# Patient Record
Sex: Female | Born: 1935 | Race: White | Hispanic: No | Smoking: Never smoker
Health system: Southern US, Community
[De-identification: ages and names within clinical notes are randomized; demographics above are authoritative.]

## PROBLEM LIST (undated history)

## (undated) DIAGNOSIS — Z86711 Personal history of pulmonary embolism: Secondary | ICD-10-CM

## (undated) DIAGNOSIS — E785 Hyperlipidemia, unspecified: Secondary | ICD-10-CM

## (undated) DIAGNOSIS — Z978 Presence of other specified devices: Secondary | ICD-10-CM

## (undated) DIAGNOSIS — K529 Noninfective gastroenteritis and colitis, unspecified: Secondary | ICD-10-CM

## (undated) DIAGNOSIS — Z7901 Long term (current) use of anticoagulants: Secondary | ICD-10-CM

## (undated) DIAGNOSIS — R234 Changes in skin texture: Secondary | ICD-10-CM

## (undated) DIAGNOSIS — K219 Gastro-esophageal reflux disease without esophagitis: Secondary | ICD-10-CM

## (undated) DIAGNOSIS — IMO0001 Reserved for inherently not codable concepts without codable children: Secondary | ICD-10-CM

## (undated) DIAGNOSIS — Z960 Presence of urogenital implants: Secondary | ICD-10-CM

## (undated) DIAGNOSIS — I10 Essential (primary) hypertension: Secondary | ICD-10-CM

## (undated) DIAGNOSIS — R531 Weakness: Secondary | ICD-10-CM

## (undated) DIAGNOSIS — R42 Dizziness and giddiness: Secondary | ICD-10-CM

## (undated) DIAGNOSIS — I2510 Atherosclerotic heart disease of native coronary artery without angina pectoris: Secondary | ICD-10-CM

## (undated) DIAGNOSIS — R600 Localized edema: Secondary | ICD-10-CM

## (undated) DIAGNOSIS — R339 Retention of urine, unspecified: Secondary | ICD-10-CM

## (undated) DIAGNOSIS — H9190 Unspecified hearing loss, unspecified ear: Secondary | ICD-10-CM

## (undated) DIAGNOSIS — Z8679 Personal history of other diseases of the circulatory system: Secondary | ICD-10-CM

## (undated) DIAGNOSIS — M1990 Unspecified osteoarthritis, unspecified site: Secondary | ICD-10-CM

## (undated) DIAGNOSIS — E039 Hypothyroidism, unspecified: Secondary | ICD-10-CM

## (undated) DIAGNOSIS — Z86718 Personal history of other venous thrombosis and embolism: Secondary | ICD-10-CM

## (undated) DIAGNOSIS — F0390 Unspecified dementia without behavioral disturbance: Secondary | ICD-10-CM

## (undated) DIAGNOSIS — Z9289 Personal history of other medical treatment: Secondary | ICD-10-CM

## (undated) HISTORY — PX: CARDIAC CATHETERIZATION: SHX172

## (undated) HISTORY — DX: Gastro-esophageal reflux disease without esophagitis: K21.9

## (undated) HISTORY — PX: CATARACT EXTRACTION W/ INTRAOCULAR LENS  IMPLANT, BILATERAL: SHX1307

## (undated) HISTORY — PX: KNEE SURGERY: SHX244

## (undated) HISTORY — DX: Personal history of other medical treatment: Z92.89

## (undated) HISTORY — PX: TUBAL LIGATION: SHX77

## (undated) HISTORY — PX: KNEE ARTHROSCOPY: SUR90

## (undated) HISTORY — PX: BLADDER SUSPENSION: SHX72

## (undated) HISTORY — DX: Essential (primary) hypertension: I10

## (undated) HISTORY — PX: TRANSTHORACIC ECHOCARDIOGRAM: SHX275

## (undated) HISTORY — PX: ABDOMINAL HYSTERECTOMY: SHX81

---

## 1997-10-31 ENCOUNTER — Other Ambulatory Visit: Admission: RE | Admit: 1997-10-31 | Discharge: 1997-10-31 | Payer: Self-pay | Admitting: *Deleted

## 1997-11-03 ENCOUNTER — Other Ambulatory Visit: Admission: RE | Admit: 1997-11-03 | Discharge: 1997-11-03 | Payer: Self-pay | Admitting: Family Medicine

## 1997-12-28 ENCOUNTER — Ambulatory Visit (HOSPITAL_COMMUNITY): Admission: RE | Admit: 1997-12-28 | Discharge: 1997-12-28 | Payer: Self-pay | Admitting: Family Medicine

## 1998-02-06 ENCOUNTER — Ambulatory Visit (HOSPITAL_COMMUNITY): Admission: RE | Admit: 1998-02-06 | Discharge: 1998-02-06 | Payer: Self-pay | Admitting: Internal Medicine

## 1998-05-19 HISTORY — PX: CHOLECYSTECTOMY: SHX55

## 1999-06-25 ENCOUNTER — Emergency Department (HOSPITAL_COMMUNITY): Admission: EM | Admit: 1999-06-25 | Discharge: 1999-06-25 | Payer: Self-pay | Admitting: *Deleted

## 1999-09-24 ENCOUNTER — Encounter: Payer: Self-pay | Admitting: Family Medicine

## 1999-09-24 ENCOUNTER — Encounter: Admission: RE | Admit: 1999-09-24 | Discharge: 1999-09-24 | Payer: Self-pay | Admitting: Family Medicine

## 1999-09-27 ENCOUNTER — Encounter: Admission: RE | Admit: 1999-09-27 | Discharge: 1999-09-27 | Payer: Self-pay | Admitting: Family Medicine

## 1999-09-27 ENCOUNTER — Encounter: Payer: Self-pay | Admitting: Family Medicine

## 1999-12-31 ENCOUNTER — Encounter: Admission: RE | Admit: 1999-12-31 | Discharge: 1999-12-31 | Payer: Self-pay | Admitting: Family Medicine

## 1999-12-31 ENCOUNTER — Encounter: Payer: Self-pay | Admitting: Family Medicine

## 2000-04-14 ENCOUNTER — Emergency Department (HOSPITAL_COMMUNITY): Admission: EM | Admit: 2000-04-14 | Discharge: 2000-04-14 | Payer: Self-pay | Admitting: Emergency Medicine

## 2000-09-30 ENCOUNTER — Encounter: Payer: Self-pay | Admitting: Family Medicine

## 2000-09-30 ENCOUNTER — Encounter: Admission: RE | Admit: 2000-09-30 | Discharge: 2000-09-30 | Payer: Self-pay | Admitting: Family Medicine

## 2001-01-07 ENCOUNTER — Other Ambulatory Visit: Admission: RE | Admit: 2001-01-07 | Discharge: 2001-01-07 | Payer: Self-pay | Admitting: *Deleted

## 2001-02-23 ENCOUNTER — Encounter: Admission: RE | Admit: 2001-02-23 | Discharge: 2001-02-23 | Payer: Self-pay | Admitting: Family Medicine

## 2001-02-23 ENCOUNTER — Encounter: Payer: Self-pay | Admitting: Family Medicine

## 2001-12-21 ENCOUNTER — Encounter: Payer: Self-pay | Admitting: Family Medicine

## 2001-12-21 ENCOUNTER — Encounter: Admission: RE | Admit: 2001-12-21 | Discharge: 2001-12-21 | Payer: Self-pay | Admitting: Family Medicine

## 2004-09-27 ENCOUNTER — Ambulatory Visit (HOSPITAL_COMMUNITY): Admission: RE | Admit: 2004-09-27 | Discharge: 2004-09-27 | Payer: Self-pay | Admitting: Cardiology

## 2005-08-26 ENCOUNTER — Other Ambulatory Visit: Admission: RE | Admit: 2005-08-26 | Discharge: 2005-08-26 | Payer: Self-pay | Admitting: Obstetrics and Gynecology

## 2006-05-14 ENCOUNTER — Ambulatory Visit: Payer: Self-pay | Admitting: Cardiology

## 2006-05-14 ENCOUNTER — Inpatient Hospital Stay (HOSPITAL_COMMUNITY): Admission: EM | Admit: 2006-05-14 | Discharge: 2006-05-20 | Payer: Self-pay | Admitting: Emergency Medicine

## 2008-08-03 ENCOUNTER — Observation Stay (HOSPITAL_COMMUNITY): Admission: EM | Admit: 2008-08-03 | Discharge: 2008-08-04 | Payer: Self-pay | Admitting: Emergency Medicine

## 2009-01-09 ENCOUNTER — Encounter: Admission: RE | Admit: 2009-01-09 | Discharge: 2009-01-09 | Payer: Self-pay | Admitting: Orthopedic Surgery

## 2009-01-19 ENCOUNTER — Emergency Department (HOSPITAL_COMMUNITY): Admission: EM | Admit: 2009-01-19 | Discharge: 2009-01-20 | Payer: Self-pay | Admitting: Emergency Medicine

## 2009-02-23 ENCOUNTER — Encounter (INDEPENDENT_AMBULATORY_CARE_PROVIDER_SITE_OTHER): Payer: Self-pay | Admitting: Internal Medicine

## 2009-02-23 ENCOUNTER — Inpatient Hospital Stay (HOSPITAL_COMMUNITY): Admission: EM | Admit: 2009-02-23 | Discharge: 2009-02-27 | Payer: Self-pay | Admitting: Emergency Medicine

## 2009-04-02 ENCOUNTER — Encounter: Admission: RE | Admit: 2009-04-02 | Discharge: 2009-04-02 | Payer: Self-pay | Admitting: Family Medicine

## 2010-06-12 ENCOUNTER — Ambulatory Visit: Payer: Self-pay | Admitting: Cardiology

## 2010-08-22 LAB — BASIC METABOLIC PANEL
BUN: 17 mg/dL (ref 6–23)
BUN: 23 mg/dL (ref 6–23)
BUN: 30 mg/dL — ABNORMAL HIGH (ref 6–23)
CO2: 19 mEq/L (ref 19–32)
CO2: 20 mEq/L (ref 19–32)
CO2: 22 mEq/L (ref 19–32)
Calcium: 8 mg/dL — ABNORMAL LOW (ref 8.4–10.5)
Calcium: 8.5 mg/dL (ref 8.4–10.5)
Chloride: 108 mEq/L (ref 96–112)
Chloride: 112 mEq/L (ref 96–112)
Creatinine, Ser: 0.98 mg/dL (ref 0.4–1.2)
Creatinine, Ser: 0.99 mg/dL (ref 0.4–1.2)
Creatinine, Ser: 1.06 mg/dL (ref 0.4–1.2)
Creatinine, Ser: 1.4 mg/dL — ABNORMAL HIGH (ref 0.4–1.2)
GFR calc non Af Amer: 54 mL/min — ABNORMAL LOW (ref >60)
Glucose, Bld: 148 mg/dL — ABNORMAL HIGH (ref 70–99)
Glucose, Bld: 243 mg/dL — ABNORMAL HIGH (ref 70–99)
Potassium: 3.8 mEq/L (ref 3.5–5.1)

## 2010-08-22 LAB — GLUCOSE, CAPILLARY
Glucose-Capillary: 142 mg/dL — ABNORMAL HIGH (ref 70–99)
Glucose-Capillary: 159 mg/dL — ABNORMAL HIGH (ref 70–99)
Glucose-Capillary: 173 mg/dL — ABNORMAL HIGH (ref 70–99)
Glucose-Capillary: 180 mg/dL — ABNORMAL HIGH (ref 70–99)
Glucose-Capillary: 182 mg/dL — ABNORMAL HIGH (ref 70–99)
Glucose-Capillary: 236 mg/dL — ABNORMAL HIGH (ref 70–99)
Glucose-Capillary: 263 mg/dL — ABNORMAL HIGH (ref 70–99)
Glucose-Capillary: 279 mg/dL — ABNORMAL HIGH (ref 70–99)
Glucose-Capillary: 280 mg/dL — ABNORMAL HIGH (ref 70–99)
Glucose-Capillary: 285 mg/dL — ABNORMAL HIGH (ref 70–99)
Glucose-Capillary: 394 mg/dL — ABNORMAL HIGH (ref 70–99)
Glucose-Capillary: 63 mg/dL — ABNORMAL LOW (ref 70–99)

## 2010-08-22 LAB — URINE CULTURE: Culture: NO GROWTH

## 2010-08-22 LAB — CBC
HCT: 29.6 % — ABNORMAL LOW (ref 36.0–46.0)
HCT: 31.3 % — ABNORMAL LOW (ref 36.0–46.0)
HCT: 34.5 % — ABNORMAL LOW (ref 36.0–46.0)
Hemoglobin: 11 g/dL — ABNORMAL LOW (ref 12.0–15.0)
Hemoglobin: 11.9 g/dL — ABNORMAL LOW (ref 12.0–15.0)
MCHC: 34.3 g/dL (ref 30.0–36.0)
MCHC: 35.1 g/dL (ref 30.0–36.0)
MCV: 83.9 fL (ref 78.0–100.0)
MCV: 84.1 fL (ref 78.0–100.0)
MCV: 85.1 fL (ref 78.0–100.0)
MCV: 85.1 fL (ref 78.0–100.0)
Platelets: 200 10*3/uL (ref 150–400)
Platelets: 218 10*3/uL (ref 150–400)
Platelets: 223 10*3/uL (ref 150–400)
RBC: 3.73 MIL/uL — ABNORMAL LOW (ref 3.87–5.11)
RBC: 4.1 MIL/uL (ref 3.87–5.11)
RDW: 14.9 % (ref 11.5–15.5)
RDW: 15.1 % (ref 11.5–15.5)
WBC: 8.6 10*3/uL (ref 4.0–10.5)
WBC: 9 10*3/uL (ref 4.0–10.5)
WBC: 9.7 10*3/uL (ref 4.0–10.5)

## 2010-08-22 LAB — CARDIAC PANEL(CRET KIN+CKTOT+MB+TROPI)
Troponin I: 0.1 ng/mL — ABNORMAL HIGH (ref 0.00–0.06)
Troponin I: 0.15 ng/mL — ABNORMAL HIGH (ref 0.00–0.06)

## 2010-08-22 LAB — COMPREHENSIVE METABOLIC PANEL
ALT: 12 U/L (ref 0–35)
AST: 18 U/L (ref 0–37)
Calcium: 9 mg/dL (ref 8.4–10.5)
Creatinine, Ser: 1.48 mg/dL — ABNORMAL HIGH (ref 0.4–1.2)
GFR calc non Af Amer: 34 mL/min — ABNORMAL LOW (ref >60)
Potassium: 4.8 mEq/L (ref 3.5–5.1)
Total Bilirubin: 0.5 mg/dL (ref 0.3–1.2)

## 2010-08-22 LAB — MAGNESIUM: Magnesium: 1.8 mg/dL (ref 1.5–2.5)

## 2010-08-22 LAB — LIPID PANEL
LDL Cholesterol: 71 mg/dL (ref 0–99)
Total CHOL/HDL Ratio: 4.5 RATIO
VLDL: 26 mg/dL (ref 0–40)

## 2010-08-22 LAB — PROTIME-INR
INR: 1.24 (ref 0.00–1.49)
INR: 2.88 — ABNORMAL HIGH (ref 0.00–1.49)
INR: 3.01 — ABNORMAL HIGH (ref 0.00–1.49)
Prothrombin Time: 15.5 seconds — ABNORMAL HIGH (ref 11.6–15.2)
Prothrombin Time: 26.4 seconds — ABNORMAL HIGH (ref 11.6–15.2)
Prothrombin Time: 29.9 seconds — ABNORMAL HIGH (ref 11.6–15.2)

## 2010-08-22 LAB — DIFFERENTIAL
Basophils Absolute: 0 10*3/uL (ref 0.0–0.1)
Basophils Relative: 0 % (ref 0–1)
Basophils Relative: 0 % (ref 0–1)
Eosinophils Absolute: 0 10*3/uL (ref 0.0–0.7)
Eosinophils Absolute: 0.1 10*3/uL (ref 0.0–0.7)
Eosinophils Relative: 0 % (ref 0–5)
Eosinophils Relative: 0 % (ref 0–5)
Lymphocytes Relative: 16 % (ref 12–46)
Lymphs Abs: 1.5 10*3/uL (ref 0.7–4.0)
Lymphs Abs: 1.5 10*3/uL (ref 0.7–4.0)
Monocytes Absolute: 0.9 10*3/uL (ref 0.1–1.0)
Monocytes Absolute: 0.9 10*3/uL (ref 0.1–1.0)
Monocytes Relative: 10 % (ref 3–12)
Neutrophils Relative %: 73 % (ref 43–77)
Neutrophils Relative %: 75 % (ref 43–77)

## 2010-08-22 LAB — URINALYSIS, ROUTINE W REFLEX MICROSCOPIC
Bilirubin Urine: NEGATIVE
Glucose, UA: >1000 mg/dL — AB
Ketones, ur: NEGATIVE mg/dL
Protein, ur: 30 mg/dL — AB

## 2010-08-22 LAB — HEMOGLOBIN A1C
Hgb A1c MFr Bld: 11.3 % — ABNORMAL HIGH (ref 4.6–6.1)
Mean Plasma Glucose: 278 mg/dL

## 2010-08-22 LAB — POCT CARDIAC MARKERS: CKMB, poc: 2.9 ng/mL (ref 1.0–8.0)

## 2010-08-22 LAB — CULTURE, BLOOD (ROUTINE X 2): Culture: NO GROWTH

## 2010-08-22 LAB — URINE MICROSCOPIC-ADD ON

## 2010-08-22 LAB — CK TOTAL AND CKMB (NOT AT ARMC): Relative Index: INVALID (ref 0.0–2.5)

## 2010-08-23 LAB — BASIC METABOLIC PANEL
CO2: 25 mEq/L (ref 19–32)
Calcium: 8.9 mg/dL (ref 8.4–10.5)
GFR calc Af Amer: 50 mL/min — ABNORMAL LOW (ref >60)
Glucose, Bld: 288 mg/dL — ABNORMAL HIGH (ref 70–99)
Potassium: 4 mEq/L (ref 3.5–5.1)
Sodium: 136 mEq/L (ref 135–145)

## 2010-08-23 LAB — URINALYSIS, ROUTINE W REFLEX MICROSCOPIC
Glucose, UA: 500 mg/dL — AB
Ketones, ur: NEGATIVE mg/dL
Nitrite: NEGATIVE
Protein, ur: 100 mg/dL — AB
Specific Gravity, Urine: 1.025 (ref 1.005–1.030)
Urobilinogen, UA: 0.2 mg/dL (ref 0.0–1.0)
Urobilinogen, UA: 0.2 mg/dL (ref 0.0–1.0)
pH: 5 (ref 5.0–8.0)

## 2010-08-23 LAB — DIFFERENTIAL
Eosinophils Absolute: 0.3 10*3/uL (ref 0.0–0.7)
Lymphocytes Relative: 21 % (ref 12–46)
Lymphs Abs: 2.5 10*3/uL (ref 0.7–4.0)
Monocytes Relative: 9 % (ref 3–12)
Neutro Abs: 7.9 10*3/uL — ABNORMAL HIGH (ref 1.7–7.7)
Neutrophils Relative %: 66 % (ref 43–77)

## 2010-08-23 LAB — URINE MICROSCOPIC-ADD ON

## 2010-08-23 LAB — URINE CULTURE
Colony Count: NO GROWTH
Culture: NO GROWTH

## 2010-08-23 LAB — CBC
Platelets: 212 10*3/uL (ref 150–400)
RBC: 4.16 MIL/uL (ref 3.87–5.11)
WBC: 11.9 10*3/uL — ABNORMAL HIGH (ref 4.0–10.5)

## 2010-08-27 ENCOUNTER — Ambulatory Visit: Payer: Self-pay | Admitting: Cardiology

## 2010-08-29 LAB — GLUCOSE, CAPILLARY
Glucose-Capillary: 113 mg/dL — ABNORMAL HIGH (ref 70–99)
Glucose-Capillary: 154 mg/dL — ABNORMAL HIGH (ref 70–99)
Glucose-Capillary: 49 mg/dL — ABNORMAL LOW (ref 70–99)

## 2010-08-29 LAB — URINALYSIS, ROUTINE W REFLEX MICROSCOPIC
Bilirubin Urine: NEGATIVE
Glucose, UA: NEGATIVE mg/dL
Hgb urine dipstick: NEGATIVE
Ketones, ur: NEGATIVE mg/dL
Protein, ur: NEGATIVE mg/dL

## 2010-08-29 LAB — URINE CULTURE: Culture: NO GROWTH

## 2010-09-02 ENCOUNTER — Encounter: Payer: Self-pay | Admitting: *Deleted

## 2010-09-02 ENCOUNTER — Other Ambulatory Visit: Payer: Self-pay | Admitting: *Deleted

## 2010-09-02 MED ORDER — FUROSEMIDE 40 MG PO TABS: 40.0000 mg | ORAL_TABLET | Freq: Every day | ORAL | Status: DC

## 2010-09-03 ENCOUNTER — Ambulatory Visit: Payer: Self-pay | Admitting: Cardiology

## 2010-09-12 ENCOUNTER — Ambulatory Visit (INDEPENDENT_AMBULATORY_CARE_PROVIDER_SITE_OTHER): Payer: Medicare Other | Admitting: Cardiology

## 2010-09-12 ENCOUNTER — Other Ambulatory Visit: Payer: Self-pay | Admitting: *Deleted

## 2010-09-12 ENCOUNTER — Encounter: Payer: Self-pay | Admitting: Cardiology

## 2010-09-12 DIAGNOSIS — I10 Essential (primary) hypertension: Secondary | ICD-10-CM

## 2010-09-12 DIAGNOSIS — E669 Obesity, unspecified: Secondary | ICD-10-CM

## 2010-09-12 DIAGNOSIS — I119 Hypertensive heart disease without heart failure: Secondary | ICD-10-CM | POA: Insufficient documentation

## 2010-09-12 DIAGNOSIS — E119 Type 2 diabetes mellitus without complications: Secondary | ICD-10-CM | POA: Insufficient documentation

## 2010-09-12 DIAGNOSIS — I2510 Atherosclerotic heart disease of native coronary artery without angina pectoris: Secondary | ICD-10-CM

## 2010-09-12 MED ORDER — LOSARTAN POTASSIUM 50 MG PO TABS: 50.0000 mg | ORAL_TABLET | Freq: Every day | ORAL | Status: DC

## 2010-09-12 NOTE — Progress Notes (Signed)
Subjective:   Overall, Krystal [name] doing reasonably well. Blood pressure readings have been slightly low and we will reduce her losartan. Her granddaughter died last week after having issues with drug overdose. Mrs. Bellino is somewhat tearful about her grand daughter's death. Mrs. Radin has chronic obesity and in general, she's doing well. She's lost approximately 10 pounds weight.  Current Outpatient Prescriptions  Medication Sig Dispense Refill  . aspirin 81 MG tablet Take 81 mg by mouth daily.        Marland Kitchen diltiazem (CARTIA XT) 120 MG 24 hr capsule Take 120 mg by mouth daily.        . Fluticasone-Salmeterol (ADVAIR DISKUS) 100-50 MCG/DOSE AEPB Inhale 1 puff into the lungs every 12 (twelve) hours. 2 puffs BID       . furosemide (LASIX) 40 MG tablet Take 1 tablet (40 mg total) by mouth daily.  30 tablet  11  . glimepiride (AMARYL) 4 MG tablet Take 4 mg by mouth 2 (two) times daily at 10 AM and 5 PM.        . meclizine (ANTIVERT) 50 MG tablet Take 25 mg by mouth 2 (two) times daily with a meal.        . metoCLOPramide (REGLAN) 5 MG tablet Take 5 mg by mouth. 1/2 tablet 4 times daily       . Multiple Vitamin (MULTIVITAMIN) tablet Take 1 tablet by mouth daily.        . sitaGLIPtan-metformin (JANUMET) 50-500 MG per tablet Take 1 tablet by mouth 2 (two) times daily with a meal.        . warfarin (COUMADIN) 2 MG tablet Take 2 mg by mouth. Take as directed by the coumadin clinic       . DISCONTD: losartan (COZAAR) 100 MG tablet Take 100 mg by mouth daily.        . calcium carbonate (OS-CAL) 600 MG TABS Take 600 mg by mouth 2 (two) times daily with a meal.        . colestipol (MICRONIZED COLESTIPOL HCL) 1 G tablet Take 1 g by mouth 2 (two) times daily.        Marland Kitchen losartan (COZAAR) 50 MG tablet Take 1 tablet (50 mg total) by mouth daily.  90 tablet  1  . metFORMIN (GLUCOPHAGE-XR) 500 MG 24 hr tablet Take 1,000 mg by mouth 2 (two) times daily.        . solifenacin (VESICARE) 10 MG tablet Take 5 mg by mouth daily.         Marland Kitchen DISCONTD: losartan (COZAAR) 50 MG tablet Take 50 mg by mouth daily.         Allergies  Allergen Reactions  . Detrol (Tolterodine Tartrate)   . Humibid La (Guaifenesin)   . Vioxx (Rofecoxib)     There is no problem list on file for this patient.   History  Smoking status  . Never Smoker   Smokeless tobacco  . Current User  . Types: Snuff    History  Alcohol Use No    Family History  Problem Relation Age of Onset  . Heart disease Mother   . Hypertension Mother     Review of Systems:   The patient denies any heat or cold intolerance.  No weight gain or weight loss.  The patient denies headaches or blurry vision.  There is no cough or sputum production.  The patient denies dizziness.  There is no hematuria or hematochezia.  The patient denies any muscle aches  or arthritis.  The patient denies any rash.  The patient denies frequent falling or instability.  There is no history of depression or anxiety.  All other systems were reviewed and are negative.   Physical Exam:   Weight is 190. Blood pressure is 98/67 sitting, heart rate 77.  The head is normocephalic and atraumatic.  Pupils are equally round and reactive to light.  Sclerae nonicteric.  Conjunctiva is clear.  Oropharynx is unremarkable.  There's adequate oral airway.  Neck is supple there are no masses.  Thyroid is not enlarged.  There is no lymphadenopathy.  Lungs are clear.  Chest is symmetric.  Heart shows a regular rate and rhythm.  S1 and S2 are normal.  There is no murmur click or gallop.  Abdomen is soft normal bowel sounds.  There is no organomegaly.  Genital and rectal deferred.  Extremities are without edema.  Peripheral pulses are adequate.  Neurologically intact.  Full range of motion.  The patient is not depressed.  Skin is warm and dry.  Assessment / Plan:

## 2010-09-12 NOTE — Assessment & Plan Note (Signed)
She does have a history of significant coronary disease. Her last catheterization was in 2006 which showed only minimal irregularities scattered diffusely. I will defer management to Dr. Catha Gosselin

## 2010-09-12 NOTE — Assessment & Plan Note (Signed)
A pressure readings are low today. I will decrease losartan to 50 mg each day. I will refer to Dr. Catha Gosselin for followup.

## 2010-09-23 ENCOUNTER — Telehealth: Payer: Self-pay | Admitting: Cardiology

## 2010-09-23 NOTE — Telephone Encounter (Signed)
Pt's daughter would like to know who pt will need to see after Dr. Cristobal Goldmann.  RN advised pt's daughter to f/u with Dr. Catha Gosselin.

## 2010-09-23 NOTE — Telephone Encounter (Signed)
Received call from patient daughter states that patient was confused by call from Dr. Deborah Chalk nurse regarding an upcoming appt with another doc.  Please call, ok to leave message.

## 2010-10-01 NOTE — H&P (Signed)
NAMEMYLEY, BAHNER                 ACCOUNT NO.:  [id number]   MEDICAL RECORD NO.:  [id number]          PATIENT TYPE:  INP   LOCATION:                               FACILITY:  [facility]   PHYSICIAN:  Hollice Espy, M.D.DATE OF BIRTH:  23-Jun-1924   DATE OF ADMISSION:  08/03/2008  DATE OF DISCHARGE:                              HISTORY & PHYSICAL   The patient's PCP is Dr. Catha Gosselin.   CHIEF COMPLAINT:  Abdominal pain.   HISTORY OF PRESENT ILLNESS:  The patient is an 84-year white female with  past medical history of diabetes mellitus, hypertension and obesity who  for the last days had problems with increasing abdominal pain and  distention.  She says that she alternates between constipation and  diarrhea and has had several episodes lately of some leaking loose  stool.  When she came in the emergency room, she was evaluated and found  to have by x-ray a large amount of constipated stool higher up in the  colon.  She was unable to be disimpacted, and because of that and severe  abdominal pain,  it was felt best she come in for further treatment.   When I saw the patient, she was doing okay.  She denies any headaches,  vision changes, dysphagia, chest pain, palpitations, shortness of  breath, wheeze, cough.  She does complain of some generalized abdominal  discomfort and pain but no hematuria, dysuria, constipation, diarrhea,  focal extremity numbness, weakness or pain.  Review of systems otherwise  negative.   The patient's past medical history includes:  1. Obesity.  2. Diabetes.  3. Hypertension.  4. History of rectocele.   MEDICATIONS:  She is on:  1. Metformin 1000 p.o. b.i.d.  2. Glimepiride 4 p.o. b.i.d.  3. Cozaar 100 p.o. daily.  4. Cardizem 120 p.o. every night.  5. Advair 100/50 one puff b.i.d.  6. Vesicare 10 p.o. daily.  7. Os-Cal D 600 p.o. b.i.d.  8. Multivitamin p.o. daily.  9. Aspirin 81 p.o. daily.  10.Lasix 40 p.o. daily.  11.Meclizine 25 p.r.n. 3  times a day.  12.Colestipol 1 g 2 to 4 tablets 2 to 3 times a day as needed.  13.__________ .   She has allergies to DETROL and HUMIBID.   SOCIAL HISTORY:  She denies any cigarettes, alcohol or drug use.  She  does use snuff.   FAMILY HISTORY:  Is noncontributory.   PHYSICAL EXAMINATION:  The patient's vitals on admission:  Temperature  96.8, heart 85, blood pressure 162/66 - now down to 122/69, respirations  20, O2 saturation 92% on room air.  GENERAL:  She is alert and oriented x3 in some mild distress secondary  to abdominal pain.  HEENT:  Normocephalic atraumatic.  Mucous membranes are moist.  She has  no carotid bruits.  HEART:  Regular rate and rhythm.  S1-S2.  LUNGS:  Decreased breath sounds throughout secondary to body habitus.  ABDOMEN:  Soft, obese, but distended.  Hypoactive bowel sounds.  EXTREMITIES:  Show no clubbing or cyanosis, trace pitting edema.   LABORATORY WORK:  Hemoccult negative.  Urinalysis negative.  X-ray notes  large amount of stool in the rectosigmoid and colon, question of fecal  impaction.   ASSESSMENT AND PLAN:  1. Constipation.  Start the patient on Colace 200 b.i.d.  Start with      Fleet's enemas.  If no response with them, increase to mag citrate.      I suspect the likely underlying source behind this is      gastroparesis, and likely she is not having episodes of diarrhea      alternating with constipation, but more likely she is having looser      stool that is going around her severe constipated stool.  After she      is completely cleaned out, we will start her on a regimen of Reglan      and daily bowel regimen.  2. Diabetes mellitus.  Continue p.o. pills plus sliding scale.  3. Hypertension.  Continue Cardizem.      Hollice Espy, M.D.  Electronically Signed     SKK/MEDQ  D:  08/03/2008  T:  08/03/2008  Job:  [id number]   cc:   Caryn Bee L. Little, M.D.  Fax: [phone]

## 2010-10-04 NOTE — Cardiovascular Report (Signed)
[name], Krystal                 ACCOUNT NO.:  [id number]   MEDICAL RECORD NO.:  [id number]          PATIENT TYPE:  OIB   LOCATION:  2859                         FACILITY:  [facility]   PHYSICIAN:  Colleen Can. Deborah Chalk, M.D.DATE OF BIRTH:  [birth year]   DATE OF PROCEDURE:  09/27/2004  DATE OF DISCHARGE:  09/27/2004                              CARDIAC CATHETERIZATION   HISTORY:  [name] has a history of atypical chest pain.  She has had  symptoms of lack of energy and fatigue, and history of diabetes.  She has  shortness of breath.  She has significant exogenous obesity.  She is  referred for cardiac catheterization to evaluate coronary anatomy.   PROCEDURE:  Left heart catheterization with selective coronary angiography,  left ventricular angiography.   TYPE AND SITE OF ENTRY:  Percutaneous, right femoral artery with Angio-Seal.   CATHETERS:  6 French 4 curved Judkins right and left coronary catheters; 6  French pigtail ventriculographic catheter.   CONTRAST MATERIAL:  Omnipaque.   MEDICATIONS GIVEN PRIOR TO PROCEDURE:  Valium 10 mg p.o.   MEDICATIONS GIVEN DURING THE PROCEDURE:  Versed 2 mg IV, vancomycin 1 g IV.   COMMENTS:  The patient tolerated the procedure well. Angio-Seal resulted in  satisfactory closure of the arteriotomy site.   HEMODYNAMIC DATA:  1. The aortic pressure was 136/59.  2. LV was 137/11-18.  3. There was no aortic valve gradient noted on pullback.     ANGIOGRAPHIC DATA:  Left ventricular angiogram was performed in the RAO  position.  Overall cardiac size and silhouette are normal.  The global  ejection fraction is 60%.  Regional wall motion is normal.   1. The left main coronary artery is short and normal.  2. Left circumflex:  The left circumflex is reasonably large system.  The      system has two large marginal vessels.  They are normal.  3. Left anterior descending:  Left anterior descending is a long vessel      that extends to and wraps around  the apex.  It has some mild 20%      segmental narrowing in the mid portion with some irregularity of the      vessel there, but no obstructive disease.  4. 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  It was not engaged, but had      excellent fill with the catheters.  Right coronary artery was normal.     OVERALL IMPRESSION:  1. Normal left ventricular function.  2. Essentially normal coronary arteries with mild irregularities in the      mid portion of the left anterior descending which no significant      obstructive disease present.     DISCUSSION:  [name] has essentially normal coronary arteries with the  exception of a 3-4 cm area in the left anterior descending that has 10-20%  narrowings and irregularities.  It is felt that she does not have symptoms  related to coronary disease at this  point in time.      SNT/MEDQ  D:  09/27/2004  T:  09/28/2004  Job:  [id number]   cc:   Caryn Bee L. Little, M.D.  [address]  [place]  [place] [address]  Fax: [phone]

## 2010-10-04 NOTE — Discharge Summary (Signed)
Krystal [name], ACOCELLA NO.:  [id number]   MEDICAL RECORD NO.:  [id number]          PATIENT TYPE:  INP   LOCATION:  1437                         [facility]ACILITY:  [facility]   PHYSICIAN:  Andres Shad. Rudean Curt, MD     DATE OF BIRTH:  1924-08-15   DATE OF ADMISSION:  05/14/2006  DATE OF DISCHARGE:  05/20/2006                               DISCHARGE SUMMARY   DISCHARGE DIAGNOSES:  1. Pneumonia.  2. Acute renal failure.  3. Peripheral edema.  4. Diabetes mellitus.  5. Hypertension.   DISCHARGE MEDICATIONS:  1. Moxifloxacin 400 mg p.o. daily for 5 days.  2. Amaryl 4 mg b.i.d.  3. ____________ 1000 mg b.i.d.  4. Hyzaar 100/25 daily.  5. Cartia XT 120 mg daily.  6. Nexium 40 mg b.i.d.  7. Lasix 40 mg every other day.  8. Advair 100/50 b.i.d.  9. Meclizine 12.5 mg b.i.d.  10.Aspirin 81 mg daily.  11.Glucosamine 750 mg b.i.d.  12.Chondroitin 600 mg b.i.d.  13.Calcium 600 mg b.i.d.   ALLERGIES:  DETROL AND HUMIBID DM.   SUMMARY OF HOSPITALIZATION:  Ms. Deemer is a 77 year old female with a  past medical history notable for diabetes mellitus, chronic asthma or  COPD, hypertension and reflux who was diagnosed with pneumonia by her  primary care physician, Dr. Clarene Duke, on May 13, 2006. She was  started on oral antibiotics at the time and placed on home O2 and  discharged. On the following day, the patient had developed a fever to  102 and developed weakness and shortness of breath. She was directed to  come to the emergency department where she was found to have a lingular  consolidation suggestive of pneumonia. At the time, she was tachypneic  with a respiratory rate of 28 and an oxygen saturation of 94% on room  air. Her vital signs were otherwise unremarkable, and her physical exam  was notable for obesity, mild peripheral edema and decreased breath  sounds at the bases of her lungs.   Laboratory studies on admission were remarkable for a white blood cell  count of 15.1  with 82% neutrophils, elevated blood glucose of 233, and  an elevated creatinine of 3. She was admitted with a diagnosis of  pneumonia as well as acute renal failure. She was given moxifloxacin  intervenously as well as antitussives to treat the pneumonia. She was  also placed on oxygen to maintain her O2 saturations. It was felt that  her renal insufficiency was due to being hypovolemic; thus, her Lasix  and Hyzaar were held, and she was given IV fluids. She clinically  responded to these treatments quite well, and by the first of January,  her creatinine had improved to 1.3. On the date of discharge, she was  asymptomatic except for some peripheral edema and was breathing  comfortably in the teens with oxygen saturations 93% on room air and 98%  on 2 liters. Starting the day before discharge, the patient began to  complain of worsening peripheral edema. She felt that this was due to  her Lasix being held and the amount  of IV fluids being received. Because  of her chronic pulmonary disease and her peripheral edema, I was  concerned that she might have right ventricular dysfunction. To evaluate  this, I ordered a 2-D echocardiogram that does not have an official  reading at the time of discharge. However, the preliminary findings were  mild pulmonary hypertension but no evidence of gross right heart  failure. The patient is clinically stable for discharge at this time. I  have instructed her to follow up within the next few days with Dr.  Clarene Duke.   FINAL ASSESSMENT AND PLAN:  1. Pneumonia. Continue moxifloxacin for 5 more days to a make total of      10 days of antibiotics.  2. Follow up with Dr. Clarene Duke within the next week.      Andres Shad. Rudean Curt, MD  Electronically Signed     PML/MEDQ  D:  05/20/2006  T:  05/20/2006  Job:  [id number]   cc:   Clarene Duke, M.D.  [phone]   Colleen Can. Deborah Chalk, M.D.  Fax: [phone]

## 2010-10-04 NOTE — H&P (Signed)
NAMEALAZE, GARVERICK                 ACCOUNT NO.:  [id number]   MEDICAL RECORD NO.:  [id number]          PATIENT TYPE:  INP   LOCATION:  0103                         FACILITY:  [facility]   PHYSICIAN:  Kela Millin, M.D.DATE OF BIRTH:  01-11-1925   DATE OF ADMISSION:  05/14/2006  DATE OF DISCHARGE:                              HISTORY & PHYSICAL   PRIMARY CARE PHYSICIAN:  Dr. Catha Gosselin.   CHIEF COMPLAINT:  Fevers with worsening shortness of breath and  weakness.   HISTORY OF PRESENT ILLNESS:  The patient is a 77 year old, white female  with a past medical history significant for diabetes mellitus, ?  asthma/COPD, hypertension, GERD, who was seen by her primary care  physician on May 13, 2006, diagnosed with a pneumonia and started  on oral antibiotics, also placed on nasal cannula, O2, and sent home.  Her daughter called her primary care physician's office today stating  that the patient was very weak and she was thus directed to come to the  ER for admission.  She states that she had a fever to 102 yesterday and  also that her shortness of breath has persisted and she has had a  nonproductive cough for 1 week.  The patient denies chest pain, nausea,  vomiting, hematochezia, and no melena.  She states that she has had a  decreased p.o. intake for the past 3-4 days because of no appetite.  She  admits to intermittent leg swelling, ? PND.  She denies dysuria.  She  admits to a decreased urine output in the past few days.   The patient was seen in the emergency room and a chest x-ray showed a  lingular pneumonia and her white cell count was noted to be elevated at  15.  Her creatinine was also elevated at 3 with a BUN of 32.  She was  admitted to the [facility] hospitalist service for further evaluation and  management.   PAST MEDICAL HISTORY:  1. As stated above.  2. Morbid obesity.  3. Cardiomegaly.  4. Status post cardiac catheterization per Dr. Deborah Chalk in 2006 with  essentially normal coronaries.  5. History of palpitations.  6. History of osteoarthritis.   MEDICATIONS:  1. Amaryl 4 mg b.i.d.  2. Fortamet 1000 mg b.i.d.  3. Hyzaar 100/25 mg daily.  4. Cartia XT 120 mg daily.  5. Nexium 40 mg b.i.d.  6. Lasix 40 mg every other day.  7. Advair 100/50 b.i.d.  8. Meclizine 12.5 mg b.i.d.  9. Aspirin 81 mg daily.  10.Glucosamine 750 mg b.i.d.  11.Chondroitin 600 mg b.i.d.  12.Calcium 600 mg b.i.d.   ALLERGIES:  1. HUMIBID DM CAUSES A RASH.  2. DETROL.   SOCIAL HISTORY:  She denies tobacco, states that she dips snuff.  Denies  alcohol.   FAMILY HISTORY:  Her dad had diabetes.  Her mother had a stroke.   REVIEW OF SYSTEMS:  As in the HPI, other review of systems negative.   PHYSICAL EXAM:  GENERAL:  The patient is a morbidly obese, elderly,  white female.  She is alert and  oriented, in no apparent distress, with  nasal cannula oxygen on.  VITAL SIGNS:  Her temperature is 97.5 with a blood pressure of 101/57.  Her pulse is 97.  Respiratory rate 28.  O2 sat is 94%.  HEENT:  PERRL.  EOMI.  Slightly dry mucous membranes.  Sclerae  anicteric.  No oral exudates.  NECK:  Supple, no adenopathy.  No JVD.  No thyromegaly.  LUNGS:  Decreased breath sounds at the bases.  No wheezes and no  crackles.  CARDIOVASCULAR:  Regular rate and rhythm.  Normal S1, S2, no S3  appreciated.  ABDOMEN:  Obese, bowel sounds present, nontender, nondistended.  No  organomegaly and no masses palpable.  EXTREMITIES:  Trace edema.  No cyanosis.  NEURO:  Alert and oriented x3.  Cranial nerves II through XII grossly  intact.  Nonfocal exam.   LABORATORY DATA:  White cell count is 15.1 with a hemoglobin of 10.2,  hematocrit is 30.1, platelet count is 220, neutrophil count is 82.  Her  sodium is 137 with a potassium of 3.9, chloride is 103, CO2 is 23,  glucose is 233, with a BUN of 32, creatinine is 3, calcium 7.9.  Her  albumin is 2.8, AST is 18, ALT is 10, alkaline  phosphatase is 72, total  bilirubin is 1.   Her chest x-ray shows a left lingula pneumonia.  There is left base  scarring versus atelectasis.  There is cardiomegaly.  No CHF.   ASSESSMENT AND PLAN:  1. Lingular pneumonia - we will place on intravenous antibiotics,      antitussives - and noted that the patient is allergic to Humibid.      I will follow.  2. Acute renal failure - hydrate, likely prerenal and also medication      contributing.  Noted the patient with decreased p.o. intake and on      Lasix and Hyzaar.  We will hold the Lasix and Hyzaar at this time,      manage her closely for any signs of volume overload.  3. Diabetes mellitus - monitor Accu-Cheks, continue Amaryl, hold      metformin for now secondary to the increased creatinine.  4. Hypertension - manage her blood pressures, continue Cartia for now.      We will hold parameters for the blood      pressures.  5. History of asthma/?chronic obstructive pulmonary disease - continue      Advair, bronchodilators, and follow.  6. Gastroesophageal reflux disease - continue proton pump inhibitor.      Kela Millin, M.D.  Electronically Signed     ACV/MEDQ  D:  05/14/2006  T:  05/14/2006  Job:  [id number]   cc:   Miguel Aschoff, M.D.  Fax: [phone]   Anna Genre Little, M.D.  Fax: [phone]

## 2010-10-04 NOTE — H&P (Signed)
NAMENARYAH, CLENNEY                 ACCOUNT NO.:  [id number]   MEDICAL RECORD NO.:  [id number]            PATIENT TYPE:   LOCATION:                                 FACILITY:   PHYSICIAN:  Colleen Can. Deborah Chalk, M.D.DATE OF BIRTH:  June 24, 1924   DATE OF ADMISSION:  DATE OF DISCHARGE:                                HISTORY & PHYSICAL   CHIEF COMPLAINT:  Dyspnea on exertion with severe fatigue.   HISTORY OF PRESENT ILLNESS:  Ms. Clift is a pleasant 77 year old female who  has a known history of morbid obesity. She presents to the office for  evaluation on Sep 25, 2004 with complaints of increasing fatigue over the  past several months. She has also began to have more shortness of breath  with minimal activity as well as significant lower extremity edema.  Fortunately she has had no complaints of chest pain or chest tightness. In  the past, she has had a negative two day Cardiolite study which was  performed in November 2005. She had a two day echocardiogram performed in  May 2004 which showed well preserved LV function with mild pulmonary  hypertension. She now presents for elective repeat catheterization.   PAST MEDICAL HISTORY:  1. Morbid obesity.  2. Longstanding diabetes followed by Dr. Chestine Spore.  3. Hypertension.  4. COPD.  5. Cardiomegaly.  6. Gastroesophageal reflux disease.  7. Apparent catheterization in 2002 with no significant coronary disease.  8. Palpitations.  9. Osteoarthritis.     ALLERGIES:  DETROL and CEPHALEXIN.   CURRENT MEDICATIONS:  1. Lasix 40 mg a day.  2. Aleve twice a day.  3. Nexium 40 mg b.i.d.  4. Cardia XT 120 a day.  5. Avandamet 4-500 b.i.d.  6. Amaryl 4 mg b.i.d.  7. Hyzaar 100-25 daily.  8. Baby aspirin daily.  9. Advair twice a day.     FAMILY HISTORY:  Father died in his 62's with cancer and diabetes. Mother  died age 74 with hypertension and stroke.   SOCIAL HISTORY:  She lives alone. She continues to use snuff and does drink  coffee on  occasion. She has had no cigarette use. She has two daughters but  has lost four sons.   REVIEW OF SYMPTOMS:  Basically as noted above. She is hard of hearing and  does need to wear glasses. She reports her blood sugars have been fairly  satisfactory and she does do home Glucometers. She is beginning to have more  in the way of shortness of breath with just minimal activity as well as  feelings of extreme fatigue. She has had no frank chest pain syndrome. She  has had progressive lower extremity edema despite diuretic therapy. She is  unable to wear support stockings. Her daughter does note that she has some  degree of depression. Unfortunately she has not been successful with weight  loss.   PHYSICAL EXAMINATION:  GENERAL:  She is a very pleasant elderly white female  who is obese.  VITAL SIGNS:  Weight is 248.5 pounds, blood pressure 150/70 sitting, 140/70  standing, heart rate  is 80 and regular, respirations 18. She is afebrile.  SKIN:  Warm and dry. Color is unremarkable.  LUNGS:  Basically clear.  CARDIAC:  Shows the heart tones to be distant but with irregular rhythm.  ABDOMEN:  Morbidly obese yet soft.  EXTREMITIES:  Quite full with 2-3 plus edema up to the knees.  NEUROLOGIC:  Intact. There are gross focal deficits. She does use a cane as  an assistive device for ambulation.   PERTINENT LABS:  BNP drawn here in the office is 56. CBC from yesterday at  Dr. Fredirick Maudlin office is normal. Chemistries are normal except for a glucose  of 140.   A 12 lead electrocardiogram shows normal sinus rhythm. There is poor R wave  progression with no acute changes.   Chest x-ray is pending.   IMPRESSION:  1. Progressive shortness of breath.  2. Progressive fatigue.  3. Longstanding diabetes.  4. Longstanding hypertension.  5. Morbid obesity.     PLAN:  Will proceed on with repeat cardiac catheterization. The procedure  has been reviewed in full detail with both her and her daughter  and she is  willing to proceed on Friday, Sep 27, 2004.      LC/MEDQ  D:  09/25/2004  T:  09/25/2004  Job:  [id number]   cc:   Caryn Bee L. Little, M.D.  [address]  [place]  [place] [address]  Fax: [phone]

## 2011-01-10 ENCOUNTER — Telehealth: Payer: Self-pay | Admitting: Nurse Practitioner

## 2011-01-10 NOTE — Telephone Encounter (Signed)
Daughter called stating she has question re: diltiazem. She has been on Diltiazem 120 mg daily for a long time. But when she got Rx refilled yesterday from Dr. Clarene Duke says that the directions say BID. Pharmacist told her to call both Dr. Fredirick Maudlin office and our office to clarify. States she has spoken w/ their office and does not know why dosage changed. Per Dr. Ronnald Nian note from April says Daily. Pt is scheduled to see Dr. Clarene Duke on Mon 8/27. Per Lawson Fiscal advised to take daily instead of BiD until seen by Dr. Clarene Duke.

## 2011-01-10 NOTE — Telephone Encounter (Signed)
Has a question regarding patient's prescriptions.Krystal [name]

## 2011-04-20 ENCOUNTER — Other Ambulatory Visit: Payer: Self-pay | Admitting: Cardiology

## 2011-09-21 ENCOUNTER — Other Ambulatory Visit: Payer: Self-pay | Admitting: Cardiology

## 2011-10-28 ENCOUNTER — Other Ambulatory Visit: Payer: Self-pay | Admitting: *Deleted

## 2011-10-28 MED ORDER — MECLIZINE HCL 25 MG PO TABS: 25.0000 mg | ORAL_TABLET | Freq: Three times a day (TID) | ORAL | Status: DC | PRN

## 2012-03-04 ENCOUNTER — Other Ambulatory Visit: Payer: Self-pay | Admitting: Nurse Practitioner

## 2012-03-19 ENCOUNTER — Emergency Department (HOSPITAL_COMMUNITY)
Admission: EM | Admit: 2012-03-19 | Discharge: 2012-03-19 | Disposition: A | Discharge status: Home / Self Care | Payer: Medicare Other | Attending: Emergency Medicine | Admitting: Emergency Medicine

## 2012-03-19 DIAGNOSIS — K219 Gastro-esophageal reflux disease without esophagitis: Secondary | ICD-10-CM | POA: Insufficient documentation

## 2012-03-19 DIAGNOSIS — E669 Obesity, unspecified: Secondary | ICD-10-CM | POA: Insufficient documentation

## 2012-03-19 DIAGNOSIS — R339 Retention of urine, unspecified: Secondary | ICD-10-CM | POA: Insufficient documentation

## 2012-03-19 DIAGNOSIS — Z7901 Long term (current) use of anticoagulants: Secondary | ICD-10-CM | POA: Insufficient documentation

## 2012-03-19 DIAGNOSIS — Z79899 Other long term (current) drug therapy: Secondary | ICD-10-CM | POA: Insufficient documentation

## 2012-03-19 DIAGNOSIS — E119 Type 2 diabetes mellitus without complications: Secondary | ICD-10-CM | POA: Insufficient documentation

## 2012-03-19 DIAGNOSIS — Z90710 Acquired absence of both cervix and uterus: Secondary | ICD-10-CM | POA: Insufficient documentation

## 2012-03-19 DIAGNOSIS — I10 Essential (primary) hypertension: Secondary | ICD-10-CM | POA: Insufficient documentation

## 2012-03-19 DIAGNOSIS — Z9851 Tubal ligation status: Secondary | ICD-10-CM | POA: Insufficient documentation

## 2012-03-19 LAB — URINALYSIS, MICROSCOPIC ONLY
Bilirubin Urine: NEGATIVE
Glucose, UA: NEGATIVE mg/dL
Ketones, ur: NEGATIVE mg/dL
Leukocytes, UA: NEGATIVE
Nitrite: NEGATIVE
Protein, ur: NEGATIVE mg/dL
Specific Gravity, Urine: 1.011 (ref 1.005–1.030)
Urobilinogen, UA: 0.2 mg/dL (ref 0.0–1.0)
pH: 5 (ref 5.0–8.0)

## 2012-03-19 MED ORDER — OXYBUTYNIN CHLORIDE ER 10 MG PO TB24: 10.0000 mg | ORAL_TABLET | Freq: Every day | ORAL | Status: DC

## 2012-03-19 NOTE — ED Notes (Signed)
Unable to urinate except "for trinkling throughout the day" x last few days.  Pt c/o feeling very full and very uncomfortable on arrival here. Pt was at [facility] on [address] where pt was unable to give urine specimen and they weren't able to cath her there b/c "she was very swollen" per pt's daughters report.

## 2012-03-20 ENCOUNTER — Emergency Department (HOSPITAL_COMMUNITY)
Admission: EM | Admit: 2012-03-20 | Discharge: 2012-03-20 | Disposition: A | Discharge status: Home / Self Care | Payer: Medicare Other | Attending: Emergency Medicine | Admitting: Emergency Medicine

## 2012-03-20 ENCOUNTER — Encounter (HOSPITAL_COMMUNITY): Payer: Self-pay | Admitting: Emergency Medicine

## 2012-03-20 DIAGNOSIS — R339 Retention of urine, unspecified: Secondary | ICD-10-CM | POA: Insufficient documentation

## 2012-03-20 DIAGNOSIS — K219 Gastro-esophageal reflux disease without esophagitis: Secondary | ICD-10-CM | POA: Insufficient documentation

## 2012-03-20 DIAGNOSIS — Z79899 Other long term (current) drug therapy: Secondary | ICD-10-CM | POA: Insufficient documentation

## 2012-03-20 DIAGNOSIS — R109 Unspecified abdominal pain: Secondary | ICD-10-CM | POA: Insufficient documentation

## 2012-03-20 DIAGNOSIS — E669 Obesity, unspecified: Secondary | ICD-10-CM | POA: Insufficient documentation

## 2012-03-20 DIAGNOSIS — Z7901 Long term (current) use of anticoagulants: Secondary | ICD-10-CM | POA: Insufficient documentation

## 2012-03-20 DIAGNOSIS — I10 Essential (primary) hypertension: Secondary | ICD-10-CM | POA: Insufficient documentation

## 2012-03-20 DIAGNOSIS — R310 Gross hematuria: Secondary | ICD-10-CM | POA: Insufficient documentation

## 2012-03-20 DIAGNOSIS — E119 Type 2 diabetes mellitus without complications: Secondary | ICD-10-CM | POA: Insufficient documentation

## 2012-03-20 LAB — BASIC METABOLIC PANEL
BUN: 31 mg/dL — ABNORMAL HIGH (ref 6–23)
CO2: 26 mEq/L (ref 19–32)
Calcium: 8.8 mg/dL (ref 8.4–10.5)
Chloride: 103 mEq/L (ref 96–112)
Creatinine, Ser: 1.48 mg/dL — ABNORMAL HIGH (ref 0.50–1.10)
GFR calc Af Amer: 36 mL/min — ABNORMAL LOW (ref >90)
GFR calc non Af Amer: 31 mL/min — ABNORMAL LOW (ref >90)
Glucose, Bld: 155 mg/dL — ABNORMAL HIGH (ref 70–99)
Potassium: 3.8 mEq/L (ref 3.5–5.1)
Sodium: 140 mEq/L (ref 135–145)

## 2012-03-20 LAB — URINALYSIS, ROUTINE W REFLEX MICROSCOPIC
Bilirubin Urine: NEGATIVE
Glucose, UA: NEGATIVE mg/dL
Hgb urine dipstick: NEGATIVE
Ketones, ur: NEGATIVE mg/dL
Leukocytes, UA: NEGATIVE
Nitrite: NEGATIVE
Protein, ur: NEGATIVE mg/dL
Specific Gravity, Urine: 1.016 (ref 1.005–1.030)
Urobilinogen, UA: 0.2 mg/dL (ref 0.0–1.0)
pH: 5.5 (ref 5.0–8.0)

## 2012-03-20 LAB — PROTIME-INR
INR: 2.18 — ABNORMAL HIGH (ref 0.00–1.49)
Prothrombin Time: 23.3 seconds — ABNORMAL HIGH (ref 11.6–15.2)

## 2012-03-20 NOTE — ED Notes (Signed)
Pt states that she was here yesterday for urinary retention.  Got a foley catheter but then it was removed before she left.  Has not been able to urinate more than a tablespoon since she left at about 2030 last night.  States that there was a small amount of blood in the urine that she did void.  States she feels the urge to go but cannot.

## 2012-03-20 NOTE — ED Notes (Signed)
Discharge teaching completed using teach back. Demonstrated how to empty catheter bag, apply leg bag, reviewed cleaning instructions. All questions answered.

## 2012-03-20 NOTE — ED Provider Notes (Signed)
History    78 year old female with urinary retention. Patient seen in the emergency room by myself yesterday for the same. She had a Foley catheter placed at that time. Patient refused to be discharged with that though. She is returning today because of persistent symptoms. Patient states that she is only able to void very small amounts. Tablespoon at most. Did notice gross hematuria today. Is on Coumadin. No fevers or chills. No nausea or vomiting. Increasing pain in her lower abdomen.   CSN: [id number]  Arrival date & time 03/20/12  1519   First MD Initiated Contact with Patient 03/20/12 1713      Chief Complaint  Patient presents with  . Urinary Retention    (Consider location/radiation/quality/duration/timing/severity/associated sxs/prior treatment) HPI  Past Medical History  Diagnosis Date  . Hypertension   . Obesity   . GERD (gastroesophageal reflux disease)   . Diabetes mellitus     Past Surgical History  Procedure Date  . Knee surgery   . Tubal ligation   . Abdominal hysterectomy   . Cardiac catheterization     09/2004-min. CAD  . Cholecystectomy     Family History  Problem Relation Age of Onset  . Heart disease Mother   . Hypertension Mother     History  Substance Use Topics  . Smoking status: Never Smoker   . Smokeless tobacco: Current User    Types: Snuff  . Alcohol Use: No    OB History    Grav Para Term Preterm Abortions TAB SAB Ect Mult Living                  Review of Systems   Review of symptoms negative unless otherwise noted in HPI.   Allergies  Detrol; Humibid la; Latex; and Vioxx  Home Medications   Current Outpatient Rx  Name Route Sig Dispense Refill  . CETIRIZINE HCL 10 MG PO CHEW Oral Chew 10 mg by mouth daily.    Marland Kitchen DILTIAZEM HCL 120 MG PO TABS Oral Take 120 mg by mouth 2 (two) times daily.    Marland Kitchen FERROUS SULFATE 325 (65 FE) MG PO TABS Oral Take 325 mg by mouth at bedtime.     Marland Kitchen FLUTICASONE-SALMETEROL 100-50 MCG/DOSE IN AEPB  Inhalation Inhale 1 puff into the lungs every 12 (twelve) hours. 2 puffs BID    . FUROSEMIDE 40 MG PO TABS Oral Take 40 mg by mouth daily.    Marland Kitchen GLIMEPIRIDE 4 MG PO TABS Oral Take 4 mg by mouth daily.     Marland Kitchen LOSARTAN POTASSIUM 100 MG PO TABS Oral Take 100 mg by mouth daily.    Marland Kitchen MECLIZINE HCL 25 MG PO TABS Oral Take 25 mg by mouth 3 (three) times daily as needed.    Marland Kitchen METOCLOPRAMIDE HCL 5 MG PO TABS Oral Take 2.5 mg by mouth 2 (two) times daily. 1/2 tablet 4 times daily    . OMEPRAZOLE 40 MG PO CPDR Oral Take 40 mg by mouth daily.    . OXYBUTYNIN CHLORIDE ER 10 MG PO TB24 Oral Take 1 tablet (10 mg total) by mouth daily. 10 tablet 0  . PRAVASTATIN SODIUM 20 MG PO TABS Oral Take 20 mg by mouth daily.    Marland Kitchen SITAGLIPTIN-METFORMIN HCL 50-500 MG PO TABS Oral Take 1 tablet by mouth 2 (two) times daily with a meal.     . TRAMADOL HCL 50 MG PO TABS Oral Take 50 mg by mouth every 6 (six) hours as needed. Pain    .  VITAMIN B-12 100 MCG PO TABS Oral Take 50 mcg by mouth daily.    . WARFARIN SODIUM 2 MG PO TABS Oral Take 2 mg by mouth daily.       BP 110/37  Pulse 71  Temp 97.6 F (36.4 C) (Oral)  Resp 18  SpO2 96%  Physical Exam  Nursing note and vitals reviewed. Constitutional: She appears well-developed and well-nourished. No distress.       Laying in bed. NAD. Obese.  HENT:  Head: Normocephalic and atraumatic.  Eyes: Conjunctivae normal are normal. Right eye exhibits no discharge. Left eye exhibits no discharge.  Neck: Neck supple.  Cardiovascular: Normal rate, regular rhythm and normal heart sounds.  Exam reveals no gallop and no friction rub.   No murmur heard. Pulmonary/Chest: Effort normal and breath sounds normal. No respiratory distress.  Abdominal: Soft. She exhibits no distension. There is tenderness.       Mild suprapubic tenderness  Musculoskeletal: She exhibits no edema and no tenderness.  Neurological: She is alert.  Skin: Skin is warm and dry.  Psychiatric: She has a normal  mood and affect. Her behavior is normal. Thought content normal.    ED Course  Procedures (including critical care time)  Labs Reviewed - No data to display No results found.   1. Urinary retention       MDM  87yF with urinary retention. Seen in ED yesterday for same. Foley placed, but pt refused to be discharged with it. Symptoms persisted. Now agreeable to keep it. Urine yesterday pale yellow and again today. Pt noted gross hematuria today but UA unremarkable. Not suggestive of infection. Is on coumadin. INR is in therapeutic range. Some renal sufficiency but will likely improve with catheter in place. Pt previously seen by Dr Vernie Ammons. Outpt urology FU.        Raeford Razor, MD 03/20/12 1902

## 2012-03-20 NOTE — ED Notes (Signed)
Pt started passing very small amount of urine each time she tries to go.  Started Thurs. Night.  Denies fever, nausea, vomiting.  Pain in her bladder area that comes and goes.  No pain at this time.  Pt was seen last night.

## 2012-03-21 LAB — URINE CULTURE
Colony Count: NO GROWTH
Culture: NO GROWTH

## 2012-03-23 NOTE — ED Provider Notes (Signed)
History    10yF with urinary retention. Hx of same requires foley catheter placement previously. Seen by Dr Vernie Ammons at that time. Pt with mild lower abdominal discomfort. No fever or chills. No n/v. No dysuria recently. No hematuria. No recent med changes. No acute back pain.   CSN: [id number]  Arrival date & time 03/19/12  1605   First MD Initiated Contact with Patient 03/19/12 1924      Chief Complaint  Patient presents with  . Urinary Retention    (Consider location/radiation/quality/duration/timing/severity/associated sxs/prior treatment) HPI  Past Medical History  Diagnosis Date  . Hypertension   . Obesity   . GERD (gastroesophageal reflux disease)   . Diabetes mellitus     Past Surgical History  Procedure Date  . Knee surgery   . Tubal ligation   . Abdominal hysterectomy   . Cardiac catheterization     09/2004-min. CAD  . Cholecystectomy     Family History  Problem Relation Age of Onset  . Heart disease Mother   . Hypertension Mother     History  Substance Use Topics  . Smoking status: Never Smoker   . Smokeless tobacco: Current User    Types: Snuff  . Alcohol Use: No    OB History    Grav Para Term Preterm Abortions TAB SAB Ect Mult Living                  Review of Systems   Review of symptoms negative unless otherwise noted in HPI.   Allergies  Detrol; Humibid la; Latex; and Vioxx  Home Medications   Current Outpatient Rx  Name  Route  Sig  Dispense  Refill  . CETIRIZINE HCL 10 MG PO CHEW   Oral   Chew 10 mg by mouth daily.         Marland Kitchen DILTIAZEM HCL 120 MG PO TABS   Oral   Take 120 mg by mouth 2 (two) times daily.         Marland Kitchen FERROUS SULFATE 325 (65 FE) MG PO TABS   Oral   Take 325 mg by mouth at bedtime.          Marland Kitchen FLUTICASONE-SALMETEROL 100-50 MCG/DOSE IN AEPB   Inhalation   Inhale 1 puff into the lungs every 12 (twelve) hours. 2 puffs BID         . FUROSEMIDE 40 MG PO TABS   Oral   Take 40 mg by mouth daily.           Marland Kitchen GLIMEPIRIDE 4 MG PO TABS   Oral   Take 4 mg by mouth daily.          Marland Kitchen LOSARTAN POTASSIUM 100 MG PO TABS   Oral   Take 100 mg by mouth daily.         Marland Kitchen MECLIZINE HCL 25 MG PO TABS   Oral   Take 25 mg by mouth 3 (three) times daily as needed.         Marland Kitchen METOCLOPRAMIDE HCL 5 MG PO TABS   Oral   Take 2.5 mg by mouth 2 (two) times daily. 1/2 tablet 4 times daily         . OMEPRAZOLE 40 MG PO CPDR   Oral   Take 40 mg by mouth daily.         Marland Kitchen PRAVASTATIN SODIUM 20 MG PO TABS   Oral   Take 20 mg by mouth daily.         Marland Kitchen  SITAGLIPTIN-METFORMIN HCL 50-500 MG PO TABS   Oral   Take 1 tablet by mouth 2 (two) times daily with a meal.          . TRAMADOL HCL 50 MG PO TABS   Oral   Take 50 mg by mouth every 6 (six) hours as needed. Pain         . VITAMIN B-12 100 MCG PO TABS   Oral   Take 50 mcg by mouth daily.         . WARFARIN SODIUM 2 MG PO TABS   Oral   Take 2 mg by mouth daily.          . OXYBUTYNIN CHLORIDE ER 10 MG PO TB24   Oral   Take 1 tablet (10 mg total) by mouth daily.   10 tablet   0     BP 128/62  Pulse 61  Temp 98.6 F (37 C) (Oral)  Resp 18  SpO2 100%  Physical Exam  Nursing note and vitals reviewed. Constitutional: She appears well-developed and well-nourished. No distress.  HENT:  Head: Normocephalic and atraumatic.  Eyes: Conjunctivae normal are normal. Right eye exhibits no discharge. Left eye exhibits no discharge.  Neck: Neck supple.  Cardiovascular: Normal rate, regular rhythm and normal heart sounds.  Exam reveals no gallop and no friction rub.   No murmur heard. Pulmonary/Chest: Effort normal and breath sounds normal. No respiratory distress.  Abdominal: Soft. She exhibits no distension and no mass. There is tenderness. There is no rebound and no guarding.       mild lower abdominal tenderness w/o rebound or guarding. Bladder does seem to be particularly distended.  Genitourinary:       No cva tenderness.  Normal appearing external female genitalia.  Musculoskeletal: She exhibits no edema and no tenderness.  Neurological: She is alert.  Skin: Skin is warm and dry.  Psychiatric: She has a normal mood and affect. Her behavior is normal. Thought content normal.    ED Course  Procedures (including critical care time)  Labs Reviewed  URINALYSIS, MICROSCOPIC ONLY - Abnormal; Notable for the following:    Hgb urine dipstick MODERATE (*)     Casts HYALINE CASTS (*)     All other components within normal limits  LAB REPORT - SCANNED   No results found.   1. Urinary retention       MDM  87yF with urinary retention. Catheter placed with relief of symptoms. Pt refusing to be discharged with it. Explained recommendations and still refusing. UA not suggestive of infection. Pt instructed to FU with urology and discussed emergent return precautions.        Raeford Razor, MD 03/23/12 [phone]

## 2012-03-24 ENCOUNTER — Other Ambulatory Visit: Payer: Self-pay | Admitting: Urology

## 2012-03-25 ENCOUNTER — Emergency Department (HOSPITAL_COMMUNITY)
Admission: EM | Admit: 2012-03-25 | Discharge: 2012-03-25 | Disposition: A | Discharge status: Home / Self Care | Payer: Medicare Other | Attending: Emergency Medicine | Admitting: Emergency Medicine

## 2012-03-25 DIAGNOSIS — Z7901 Long term (current) use of anticoagulants: Secondary | ICD-10-CM | POA: Insufficient documentation

## 2012-03-25 DIAGNOSIS — R609 Edema, unspecified: Secondary | ICD-10-CM | POA: Insufficient documentation

## 2012-03-25 DIAGNOSIS — K219 Gastro-esophageal reflux disease without esophagitis: Secondary | ICD-10-CM | POA: Insufficient documentation

## 2012-03-25 DIAGNOSIS — E119 Type 2 diabetes mellitus without complications: Secondary | ICD-10-CM | POA: Insufficient documentation

## 2012-03-25 DIAGNOSIS — E669 Obesity, unspecified: Secondary | ICD-10-CM | POA: Insufficient documentation

## 2012-03-25 DIAGNOSIS — Z79899 Other long term (current) drug therapy: Secondary | ICD-10-CM | POA: Insufficient documentation

## 2012-03-25 DIAGNOSIS — I10 Essential (primary) hypertension: Secondary | ICD-10-CM | POA: Insufficient documentation

## 2012-03-25 DIAGNOSIS — R600 Localized edema: Secondary | ICD-10-CM

## 2012-03-25 LAB — CBC
HCT: 32.8 % — ABNORMAL LOW (ref 36.0–46.0)
MCV: 84.8 fL (ref 78.0–100.0)
Platelets: 282 10*3/uL (ref 150–400)
RBC: 3.87 MIL/uL (ref 3.87–5.11)
RDW: 13.4 % (ref 11.5–15.5)
WBC: 11.1 10*3/uL — ABNORMAL HIGH (ref 4.0–10.5)

## 2012-03-25 LAB — COMPREHENSIVE METABOLIC PANEL
Albumin: 3.1 g/dL — ABNORMAL LOW (ref 3.5–5.2)
Alkaline Phosphatase: 81 U/L (ref 39–117)
BUN: 22 mg/dL (ref 6–23)
Creatinine, Ser: 1.17 mg/dL — ABNORMAL HIGH (ref 0.50–1.10)
GFR calc Af Amer: 48 mL/min — ABNORMAL LOW (ref >90)
Glucose, Bld: 135 mg/dL — ABNORMAL HIGH (ref 70–99)
Total Protein: 6.1 g/dL (ref 6.0–8.3)

## 2012-03-25 LAB — PROTIME-INR
INR: 1.51 — ABNORMAL HIGH (ref 0.00–1.49)
Prothrombin Time: 17.8 seconds — ABNORMAL HIGH (ref 11.6–15.2)

## 2012-03-25 NOTE — ED Notes (Signed)
Pt presents to the ED complaining of bilat feet edema. Pt legs are dry and scaly. Daughter at bedside. Pt has a foley cath due to inability to void on her own. She is scheduled for supra pubic on next Friday. Pt was seen at Urologist Dr.Ottelin on Tuesday. Pt feet are cool to touch. No complain of pain in feet or legs.

## 2012-03-25 NOTE — Progress Notes (Signed)
*  PRELIMINARY RESULTS* Vascular Ultrasound Lower extremity venous duplex has been completed.  Preliminary findings: Bilateral:  No evidence of DVT, superficial thrombosis, or Baker's Cyst.    Farrel Demark, RDMS, RVT 03/25/2012, 1:57 PM

## 2012-03-25 NOTE — ED Provider Notes (Signed)
History     CSN: [id number]  Arrival date & time 03/25/12  4098   First MD Initiated Contact with Patient 03/25/12 [phone]      Chief Complaint  Patient presents with  . Foot Swelling    (Consider location/radiation/quality/duration/timing/severity/associated sxs/prior treatment) HPI Pt presents with bilateral lower extremity swelling.  She and her daughter state she has had this in the past but not lately, and it began last night.  She has been trying to prop her legs up but states it is not helping.  Swelling is equal in both legs.  She has hx of DVT and is taking coumadin.  No chest pain or sob.  No pain in legs.  Recently had foley catheter placed for urinary retention and is scheduled for suprapubic catheter placement. There are no other associated systemic symptoms, there are no other alleviating or modifying factors.   Past Medical History  Diagnosis Date  . Hypertension   . Obesity   . GERD (gastroesophageal reflux disease)   . Diabetes mellitus     Past Surgical History  Procedure Date  . Knee surgery   . Tubal ligation   . Abdominal hysterectomy   . Cardiac catheterization     09/2004-min. CAD  . Cholecystectomy     Family History  Problem Relation Age of Onset  . Heart disease Mother   . Hypertension Mother     History  Substance Use Topics  . Smoking status: Never Smoker   . Smokeless tobacco: Current User    Types: Snuff  . Alcohol Use: No    OB History    Grav Para Term Preterm Abortions TAB SAB Ect Mult Living                  Review of Systems ROS reviewed and all otherwise negative except for mentioned in HPI  Allergies  Detrol; Humibid la; Latex; and Vioxx  Home Medications   Current Outpatient Rx  Name  Route  Sig  Dispense  Refill  . CETIRIZINE HCL 10 MG PO CHEW   Oral   Chew 10 mg by mouth daily.         Marland Kitchen DILTIAZEM HCL 120 MG PO TABS   Oral   Take 120 mg by mouth 2 (two) times daily.         Marland Kitchen FERROUS SULFATE 325 (65 FE)  MG PO TABS   Oral   Take 325 mg by mouth at bedtime.          Marland Kitchen FLUTICASONE-SALMETEROL 100-50 MCG/DOSE IN AEPB   Inhalation   Inhale 1 puff into the lungs every 12 (twelve) hours. 2 puffs BID         . FUROSEMIDE 40 MG PO TABS   Oral   Take 40 mg by mouth daily.         Marland Kitchen GLIMEPIRIDE 4 MG PO TABS   Oral   Take 4 mg by mouth daily.          Marland Kitchen LOSARTAN POTASSIUM 100 MG PO TABS   Oral   Take 100 mg by mouth daily.         Marland Kitchen MECLIZINE HCL 25 MG PO TABS   Oral   Take 25 mg by mouth 3 (three) times daily as needed.         Marland Kitchen METOCLOPRAMIDE HCL 5 MG PO TABS   Oral   Take 2.5 mg by mouth 2 (two) times daily. 1/2 tablet 4 times daily         .  OMEPRAZOLE 40 MG PO CPDR   Oral   Take 40 mg by mouth daily.         . OXYBUTYNIN CHLORIDE ER 10 MG PO TB24   Oral   Take 1 tablet (10 mg total) by mouth daily.   10 tablet   0   . PRAVASTATIN SODIUM 20 MG PO TABS   Oral   Take 20 mg by mouth daily.         Marland Kitchen SITAGLIPTIN-METFORMIN HCL 50-500 MG PO TABS   Oral   Take 1 tablet by mouth 2 (two) times daily with a meal.          . TRAMADOL HCL 50 MG PO TABS   Oral   Take 50 mg by mouth every 6 (six) hours as needed. Pain         . VITAMIN B-12 100 MCG PO TABS   Oral   Take 50 mcg by mouth daily.         . WARFARIN SODIUM 2 MG PO TABS   Oral   Take 2 mg by mouth daily.            BP 101/52  Pulse 60  Resp 16  Wt 182 lb (82.555 kg)  SpO2 94% Vitals reviewed Physical Exam Physical Examination: General appearance - alert, well appearing, and in no distress Mental status - alert, oriented to person, place, and time Eyes - pupils equal and reactive, no conjunctival injection, no scleral icterus Mouth - mucous membranes moist, pharynx normal without lesions Chest - clear to auscultation, no wheezes, rales or rhonchi, symmetric air entry Heart - normal rate, regular rhythm, normal S1, S2, no murmurs, rubs, clicks or gallops Abdomen - soft, nontender,  nondistended, no masses or organomegaly, nabs Extremities - peripheral pulses normal, bilateral 2+ pedal edema, no clubbing or cyanosis Skin - normal coloration and turgor, no rashes  ED Course  Procedures (including critical care time)  Labs Reviewed  COMPREHENSIVE METABOLIC PANEL - Abnormal; Notable for the following:    Glucose, Bld 135 (*)     Creatinine, Ser 1.17 (*)     Albumin 3.1 (*)     GFR calc non Af Amer 41 (*)     GFR calc Af Amer 48 (*)     All other components within normal limits  CBC - Abnormal; Notable for the following:    WBC 11.1 (*)     Hemoglobin 11.0 (*)     HCT 32.8 (*)     All other components within normal limits  PROTIME-INR - Abnormal; Notable for the following:    Prothrombin Time 17.8 (*)     INR 1.51 (*)     All other components within normal limits  PRO B NATRIURETIC PEPTIDE   No results found.   1. Lower extremity edema       MDM  Pt presenting with bilateral lower extremity edema.  She has hx of DVT and is taking coumadin.  INR is 1.5 today.  For that reason, DVT studys obtained and no evidence of lower extremity DVT.  Pt adivsed to increase her coumadin dose x 2 doses and f/u with PMD for further coumadin instructions.  Discharged with strict return precautions.  Pt agreeable with plan.        Ethelda Chick, MD 03/25/12 1539

## 2012-03-25 NOTE — ED Notes (Signed)
Pt had BM. Pt Reports it was hard.

## 2012-03-29 ENCOUNTER — Encounter (HOSPITAL_BASED_OUTPATIENT_CLINIC_OR_DEPARTMENT_OTHER): Payer: Self-pay | Admitting: *Deleted

## 2012-03-30 ENCOUNTER — Encounter (HOSPITAL_BASED_OUTPATIENT_CLINIC_OR_DEPARTMENT_OTHER): Payer: Self-pay | Admitting: *Deleted

## 2012-03-30 NOTE — Progress Notes (Signed)
SPOKE W/ DAUGHTER, LINDA. NPO AFTER MN, INCLUDES NO SNUFF. ARRIVES AT 0600. NEEDS HG, BMET, PT/INR, AND EKG. WILL TAKE CARDIZEM AM OF SURG W/ SIP OF WATER.

## 2012-03-31 ENCOUNTER — Ambulatory Visit: Payer: Medicare Other

## 2012-03-31 ENCOUNTER — Ambulatory Visit (INDEPENDENT_AMBULATORY_CARE_PROVIDER_SITE_OTHER): Payer: Medicare Other | Admitting: Nurse Practitioner

## 2012-03-31 ENCOUNTER — Encounter: Payer: Self-pay | Admitting: Nurse Practitioner

## 2012-03-31 VITALS — BP 110/50 | HR 80 | Ht 62.0 in | Wt 182.0 lb

## 2012-03-31 DIAGNOSIS — N390 Urinary tract infection, site not specified: Secondary | ICD-10-CM

## 2012-03-31 DIAGNOSIS — R609 Edema, unspecified: Secondary | ICD-10-CM

## 2012-03-31 LAB — BASIC METABOLIC PANEL
BUN: 28 mg/dL — ABNORMAL HIGH (ref 6–23)
CO2: 27 mEq/L (ref 19–32)
Calcium: 9 mg/dL (ref 8.4–10.5)
Chloride: 99 mEq/L (ref 96–112)
Creatinine, Ser: 1.4 mg/dL — ABNORMAL HIGH (ref 0.4–1.2)
GFR: 38.23 mL/min — ABNORMAL LOW (ref >60.00)
Glucose, Bld: 160 mg/dL — ABNORMAL HIGH (ref 70–99)
Potassium: 3.8 mEq/L (ref 3.5–5.1)
Sodium: 136 mEq/L (ref 135–145)

## 2012-03-31 LAB — URINALYSIS WITH CULTURE, IF INDICATED
Bilirubin Urine: NEGATIVE
Ketones, ur: NEGATIVE
Nitrite: POSITIVE
Specific Gravity, Urine: 1.015 (ref 1.000–1.030)
Total Protein, Urine: NEGATIVE
Urine Glucose: NEGATIVE
Urobilinogen, UA: 0.2 (ref 0.0–1.0)
pH: 5.5 (ref 5.0–8.0)

## 2012-03-31 LAB — CBC WITH DIFFERENTIAL/PLATELET
Basophils Absolute: 0 10*3/uL (ref 0.0–0.1)
Basophils Relative: 0.3 % (ref 0.0–3.0)
Eosinophils Absolute: 0.3 10*3/uL (ref 0.0–0.7)
Eosinophils Relative: 2.2 % (ref 0.0–5.0)
HCT: 35.6 % — ABNORMAL LOW (ref 36.0–46.0)
Hemoglobin: 11.6 g/dL — ABNORMAL LOW (ref 12.0–15.0)
Lymphocytes Relative: 17.9 % (ref 12.0–46.0)
Lymphs Abs: 2 10*3/uL (ref 0.7–4.0)
MCHC: 32.6 g/dL (ref 30.0–36.0)
MCV: 85.9 fl (ref 78.0–100.0)
Monocytes Absolute: 0.9 10*3/uL (ref 0.1–1.0)
Monocytes Relative: 8.4 % (ref 3.0–12.0)
Neutro Abs: 8 10*3/uL — ABNORMAL HIGH (ref 1.4–7.7)
Neutrophils Relative %: 71.2 % (ref 43.0–77.0)
Platelets: 364 10*3/uL (ref 150.0–400.0)
RBC: 4.15 Mil/uL (ref 3.87–5.11)
RDW: 13.6 % (ref 11.5–14.6)
WBC: 11.3 10*3/uL — ABNORMAL HIGH (ref 4.5–10.5)

## 2012-03-31 LAB — PROTIME-INR
INR: 1.5 ratio — ABNORMAL HIGH (ref 0.8–1.0)
Prothrombin Time: 16.2 s — ABNORMAL HIGH (ref 10.2–12.4)

## 2012-03-31 MED ORDER — CIPROFLOXACIN HCL 250 MG PO TABS: 250.0000 mg | ORAL_TABLET | Freq: Two times a day (BID) | ORAL | Status: DC

## 2012-03-31 NOTE — Patient Instructions (Addendum)
I am going to give you some antibiotics - Cipro 250 mg two times a day for a week  We will check labs today and check a urinalysis  Stay on your current medicines  I will see you in about 10 days  Call the [facility] Care office at [phone] if you have any questions, problems or concerns.

## 2012-03-31 NOTE — Progress Notes (Signed)
Krystal [name] Date of Birth: [birth year] Medical Record #[id number]  History of Present Illness: Krystal [name] is seen today for a work in visit. She is a former patient of Dr. Ronnald Nian. She has minimal CAD by remote cath in 2006. Has HTN, DVT, chronic edema and now with advanced age and inability to void.   She has had 3 ER visits for inability to void due to rectocele, cystocele and past bladder tack as well as for increased swelling. Negative duplex for DVT on November 7th. Not a candidate for a pessary.  Has a foley in place with plans to have suprapubic placed on Friday per Dr. Vernie Ammons. Currently off Coumadin for the past 4 days.   She comes in today. She is here with her daughters. I have not seen her since 2010. She is declining in health. She is depressed. She has had more swelling of her legs and her lasix has been increased. She is very upset about the foley and the need for a suprapubic catheter. Family says she has gotten a little confused over the past few days. No actual chest pain.   Current Outpatient Prescriptions on File Prior to Visit  Medication Sig Dispense Refill  . cetirizine (ZYRTEC) 10 MG chewable tablet Chew 10 mg by mouth daily.      Marland Kitchen diltiazem (CARDIZEM) 120 MG tablet Take 120 mg by mouth 2 (two) times daily.      . ferrous sulfate 325 (65 FE) MG tablet Take 325 mg by mouth at bedtime.       . Fluticasone-Salmeterol (ADVAIR DISKUS) 100-50 MCG/DOSE AEPB Inhale 1 puff into the lungs as needed. 2 puffs BID      . furosemide (LASIX) 40 MG tablet Take 80 mg by mouth daily.       Marland Kitchen glimepiride (AMARYL) 4 MG tablet Take 4 mg by mouth daily.       . Loperamide HCl (IMODIUM PO) Take by mouth as needed.      Marland Kitchen losartan (COZAAR) 100 MG tablet Take 100 mg by mouth daily.      . meclizine (ANTIVERT) 25 MG tablet Take 12.5 mg by mouth 2 (two) times daily.       . metoCLOPramide (REGLAN) 5 MG tablet Take 2.5 mg by mouth 2 (two) times daily. 1/2 tablet 4 times daily      . omeprazole  (PRILOSEC) 40 MG capsule Take 40 mg by mouth every evening.       Marland Kitchen oxybutynin (DITROPAN XL) 10 MG 24 hr tablet Take 1 tablet (10 mg total) by mouth daily.  10 tablet  0  . pravastatin (PRAVACHOL) 20 MG tablet Take 20 mg by mouth daily.      . sitaGLIPtan-metformin (JANUMET) 50-500 MG per tablet Take 1 tablet by mouth 2 (two) times daily with a meal.       . traMADol (ULTRAM) 50 MG tablet Take 50 mg by mouth every 6 (six) hours as needed. Pain      . vitamin B-12 (CYANOCOBALAMIN) 1000 MCG tablet Take 1,000 mcg by mouth daily.      Marland Kitchen warfarin (COUMADIN) 2 MG tablet Take 2 mg by mouth every evening.         Allergies  Allergen Reactions  . Latex Hives  . Detrol (Tolterodine Tartrate) Hives  . Humibid La (Guaifenesin) Other (See Comments)    UNKNOWN  . Vioxx (Rofecoxib) Hives    Past Medical History  Diagnosis Date  . Hypertension   . GERD (gastroesophageal  reflux disease)   . Diabetes mellitus   . History of atrial fibrillation EPISODE YRS AGO  . Urinary retention   . Coronary artery disease cardiologist- dr Maylon Cos--  lov 09-12-2010 in epic    non-obstructinve min. cad  . Foley catheter in place   . Edema of lower extremity BILATERAL LEG  . Hyperlipidemia   . History of pulmonary embolus (PE) 2010--  TAKES COUMADIN  . History of DVT of lower extremity   . Anticoagulated on Coumadin   . Generalized weakness AGE RELATED--  USES WALKER  . Vertigo   . OA (osteoarthritis)   . Thin skin FRAGILE  . Hypothyroidism NO MEDS  . Impaired hearing BILATERAL AIDS  . Chronic diarrhea INTERMITTANT    Past Surgical History  Procedure Date  . Knee surgery   . Tubal ligation   . Abdominal hysterectomy   . Cardiac catheterization 09-27-2004  DR TENNENT    NORMAL LVF/ MILD IRREGULARITIES IN MID LAD W/ NO SIGNIFICANT OBSTRUCTIVE DISEASE  . Abdominal hysterectomy 1994  (APPROX)    W/ BILATERAL SALPINGO-OOPHORECTOMY  . Cholecystectomy 2000  . Knee arthroscopy 1998  (APPROX)  . Bladder  suspension 2002  (APPROX)    W/ ANTERIOR AND POSTERIOR REPAIR  . Cataract extraction w/ intraocular lens  implant, bilateral   . Transthoracic echocardiogram 02-23-2009  (DX W/ PE & PAF)    EF 65-70%/ MILD MR/ MODERATE TR/ MILDLY DILATED RA  &  LA/  RV SYSTOLIC FUNCTION MODERATLY REDUCED     History  Smoking status  . Never [name]   Smokeless tobacco  . Current User  . Types: Snuff    Comment: DIPPED TOBACCO SINCE AGE 83    History  Alcohol Use No    Family History  Problem Relation Age of Onset  . Heart disease Mother   . Hypertension Mother     Review of Systems: The review of systems is per the HPI.  All other systems were reviewed and are negative.  Physical Exam: BP 110/50  Pulse 80  Ht 5\' 2"  (1.575 m)  Wt 182 lb (82.555 kg)  BMI 33.29 kg/m2 Patient is very pleasant and in no acute distress. Her affect is a little flatter than what I remember. She does seem a little depressed. Her weight is actually down from when I saw her last. She is in a wheelchair. Skin is warm and dry. Color is sallow.   HEENT is unremarkable. Normocephalic/atraumatic. PERRL. Sclera are nonicteric. Neck is supple. No masses. No JVD. Lungs are clear. Cardiac exam shows a regular rate and rhythm. Abdomen is soft. Extremities are full with trace edema. Gait is not tested. ROM appears intact. No gross neurologic deficits noted. Foley is in place and has is cloudy with a lot of sediment.    LABORATORY DATA:    Chemistry      Component Value Date/Time   NA 136 03/31/2012 1529   K 3.8 03/31/2012 1529   CL 99 03/31/2012 1529   CO2 27 03/31/2012 1529   BUN 28* 03/31/2012 1529   CREATININE 1.4* 03/31/2012 1529      Component Value Date/Time   CALCIUM 9.0 03/31/2012 1529   ALKPHOS 81 03/25/2012 1040   AST 11 03/25/2012 1040   ALT 7 03/25/2012 1040   BILITOT 0.4 03/25/2012 1040     Lab Results  Component Value Date   WBC 11.3* 03/31/2012   HGB 11.6* 03/31/2012   HCT 35.6* 03/31/2012   MCV 85.9  03/31/2012  PLT 364.0 03/31/2012    Assessment / Plan: 1. Swelling - her weight is actually down. I do not think we need to increase her Lasix at this time. Need to repeat her labs. She has already had a repeat negative duplex in the ER for DVT.   2. Inability to void - urine looks bad to me in the foley. Will send a UA/culture today. I have started her on Cipro 250 mg BID for a week. She has been cathed 3 times in the past week or so and her risk for UTI is high. For suprapubic catheter placement later this week.   3. DVT - currently off of her coumadin for her upcoming procedure.   4. Generalized failure to thrive - I tried to be encouraging to her. Family was quite Adult nurse. I will see her back in a couple of weeks to check her.   Patient is agreeable to this plan and will call if any problems develop in the interim.

## 2012-04-01 NOTE — Anesthesia Preprocedure Evaluation (Addendum)
Anesthesia Evaluation  Patient identified by MRN, date of birth, ID band Patient awake    Reviewed: Allergy & Precautions, H&P , NPO status , Patient's Chart, lab work & pertinent test results  Airway Mallampati: II TM Distance: >3 FB Neck ROM: full    Dental  (+) Edentulous Upper and Edentulous Lower   Pulmonary  Hx. PE.  On coumadin breath sounds clear to auscultation  Pulmonary exam normal       Cardiovascular Exercise Tolerance: Poor hypertension, Pt. on medications + CAD + dysrhythmias Atrial Fibrillation Rhythm:regular Rate:Normal  Non-obstructive CAD.  AF episode years ago   Neuro/Psych Very HOH negative neurological ROS  negative psych ROS   GI/Hepatic negative GI ROS, Neg liver ROS, GERD-  Medicated and Controlled,  Endo/Other  diabetes, Well Controlled, Type 2, Oral Hypoglycemic AgentsHypothyroidism   Renal/GU negative Renal ROS  negative genitourinary   Musculoskeletal   Abdominal   Peds  Hematology negative hematology ROS (+)   Anesthesia Other Findings   Reproductive/Obstetrics negative OB ROS                          Anesthesia Physical Anesthesia Plan  ASA: III  Anesthesia Plan: General   Post-op Pain Management:    Induction: Intravenous  Airway Management Planned: LMA  Additional Equipment:   Intra-op Plan:   Post-operative Plan:   Informed Consent: I have reviewed the patients History and Physical, chart, labs and discussed the procedure including the risks, benefits and alternatives for the proposed anesthesia with the patient or authorized representative who has indicated his/her understanding and acceptance.   Dental Advisory Given  Plan Discussed with: CRNA and Surgeon  Anesthesia Plan Comments:         Anesthesia Quick Evaluation

## 2012-04-01 NOTE — H&P (Signed)
History of Present Illness  LUTS: She has seen Dr. Retta Diones in the past who assisted with a bladder surgery of some form which sounds like an anterior repair or a sling type procedure. She has a history of some bladder instability and had been on Detrol in the past but that caused a rash so she can't take that. She has frequency as well as nocturia and nocturia was felt to be in part due to significant lower extremity edema with mobilization of that fluid at night. I found that initially she did empty her bladder completely and placed her on Enablex 15 mg. She developed urinary retention during an episode when she was having significant back pain due to lumbar discs. A Foley catheter was placed and her Enablex was initially stopped but she developed bladder spasms due to the catheter and was restarted on her Enablex. I scheduled her for urodynamics but she failed to return for that. She felt the Enablex lost its effectiveness so I switched her to Toviaz 8 mg which also was ineffective. She had reported urinating between 40 and 50 times a day and I checked an ultrasound PVR and found only 40 cc in her bladder.  We discussed Botox, augmentation cystoplasty and PTNS. Given her age I feel PTNS is going to be the best way to manage her bladder especially since she has been in urinary retention in the past making Botox a less attractive option. We discussed PTNS in detail and the fact that it certainly may help although it may not completely eliminate all of her urinary symptoms.   Interval history: She presented to the emergency room on 03/19/12 in urinary retention. Apparently she was catheterized for an unknown amount of urine but refused to leave the Foley catheter in and returned the next day with lower abdominal pain and small frequent voidings after which a Foley catheter was inserted and she agreed to maintain the catheter. I cannot find in the ER physician's notes anywhere the amount found in her  bladder. Her daughter tells me that when she was initially seen by her primary care physician they attempted to place a Foley catheter in order to obtain a urine specimen but were unsuccessful and she was told there was some form of swelling preventing the catheter placement. When she went to the emergency room the catheter was inserted and it was uncomfortable and was removed but when she returned for a repeat catheterization it was less uncomfortable for her. Her urinalysis at that time was clear.   Past Medical History Problems  1. Acute Urinary Retention 788.20 2. History of  Arthritis V13.4 3. History of  Asthma 493.90 4. History of  Diabetes Mellitus 250.00 5. History of  Heartburn 787.1 6. History of  Hypertension 401.9 7. History of  Pulmonary Embolism V12.51  Surgical History Problems  1. History of  Bladder Surgery 2. History of  Gallbladder Surgery 3. History of  Hysterectomy V45.77 4. History of  Knee Surgery  Current Meds 1. Adult Aspirin Low Strength 81 MG Oral Tablet Dispersible; Therapy: (Recorded:28Oct2010) to 2. Advair Diskus 100-50 MCG/DOSE MISC; Therapy: (Recorded:26Mar2009) to 3. Calcium 600 TABS; Therapy: (Recorded:28Oct2010) to 4. Cartia XT 120 MG Oral Capsule Extended Release 24 Hour; Therapy: (Recorded:26Mar2009) to 5. Coumadin TABS; Therapy: (Recorded:28Oct2010) to 6. Cozaar 100 MG Oral Tablet; Therapy: (Recorded:26Mar2009) to 7. Enablex 15 MG Oral Tablet Extended Release 24 Hour; Take 1 tablet every day; Therapy:  30Apr2010 to (Evaluate:24Jul2012); Last Rx:29Aug2011 8. Glimepiride 4 MG Oral Tablet;  Therapy: (Recorded:26Mar2009) to 9. Janumet TABS; Therapy: (Recorded:08Mar2011) to 10. Lasix TABS; Therapy: (Recorded:06Apr2011) to 11. Losartan Potassium TABS; Therapy: (Recorded:06Apr2011) to 12. Meclizine HCl 12.5 MG Oral Tablet; Therapy: (Recorded:26Mar2009) to 13. Metoclopramide HCl 10 MG Oral Tablet; Therapy: (Recorded:03Sep2010) to 14. Multiple Vitamin  TABS; Therapy: (Recorded:26Mar2009) to 15. Stool Softener 100 MG Oral Capsule; Therapy: (Recorded:01Sep2010) to 16. Sulfamethoxazole-TMP DS 800-160 MG Oral Tablet; TAKE ONE TABLET BY MOUTH TWICE   DAILY; Therapy: 13Apr2011 to (Last Rx:13Apr2011)  Allergies Medication  1. Cephalexin CAPS 2. Detrol TABS 3. Humibid Maximum Strength TB12 4. Vioxx TABS  Family History Problems  1. Family history of  Family Health Status Number Of Children 2 daughters living; 3 sons deceased and 1 daughter deceased  Social History Problems  1. Alcohol Use rare 2. Caffeine Use maybe 1 or 2 a week 3. Marital History - Divorced V61.03 4. Occupation: retired 5. Tobacco Use V15.82 "dips" snuff for past 70 yrs  Review of Systems Genitourinary, constitutional and gastrointestinal system(s) were reviewed and pertinent findings if present are noted.  Genitourinary: incomplete emptying of bladder and initiating urination requires straining.  Gastrointestinal: no flank pain.    Vitals Vital Signs  BMI Calculated: 32.78 BSA Calculated: 1.87 Height: 5 ft 3 in Weight: 185 lb  Blood Pressure: 92 / 50 Temperature: 97 F Heart Rate: 67  Physical Exam Constitutional: Well nourished and well developed. No acute distress.  ENT: The ears and nose are normal in appearance.  Neck: The appearance of the neck is normal and no neck mass is present.  Pulmonary: No respiratory distress and normal respiratory rhythm and effort.  Cardiovascular: Heart rate and rhythm are normal. No peripheral edema.  Abdomen: The abdomen is soft and nontender. No masses are palpated. No CVA tenderness. No hernias are palpable. No hepatosplenomegaly noted.  Genitourinary: Examination of the external genitalia shows vulvar atrophy, but normal female external genitalia and no lesions. The urethra is normal in appearance and not tender. There is no urethral mass. Vaginal exam demonstrates atrophy, but no abnormalities. An enterocele is  present with a midline defect (grade 2 /4). A rectocele is present with a midline defect (grade 2 /4). The cervix is is absent. The uterus is absent. The adnexa are palpably normal. The bladder is non tender and not distended. The anus is normal on inspection. The perineum is normal on inspection.  Lymphatics: The femoral and inguinal nodes are not enlarged or tender.  Skin: Normal skin turgor, no visible rash and no visible skin lesions.  Neuro/Psych: Mood and affect are appropriate.  Assessment Assessed  1. Acute Urinary Retention 788.20 2. Cystocele 618.00 3. 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 She couldn't hold any more. Despite that she was unable to void. She has a significant cystocele and enterocele. She has undergone pessary placement attempts by Dr. Retta Diones in the past with the largest pessary available tried and it was unsuccessful. That would've been the treatment if her prolapse was causing kinking of the urethra and obstruction. The other possibility is that she has a hypotonic bladder. In order to determine which of these was the case I would need to perform urodynamics however we discussed the fact that I didn't think a big surgery for vaginal prolapse would be in her best interest at 78 years of age and therefore, since the pessary is not an option either, we discussed continued Foley catheterization per  urethra, suprapubic tube placement and intermittent self-catheterization as an option that I do not think would be feasible for her.   After discussion today between the patient and her family she has elected to proceed with suprapubic tube placement. I've gone over the procedure with him in detail including its risks and complications. They understand and elect to proceed.   Plan  1. A new Foley catheter was inserted today. 2. She will be scheduled for a suprapubic tube placement as an  outpatient. 3. She will need to stop her Coumadin 5 days prior to the procedure.

## 2012-04-02 ENCOUNTER — Ambulatory Visit (HOSPITAL_BASED_OUTPATIENT_CLINIC_OR_DEPARTMENT_OTHER): Payer: Medicare Other | Admitting: Anesthesiology

## 2012-04-02 ENCOUNTER — Encounter (HOSPITAL_BASED_OUTPATIENT_CLINIC_OR_DEPARTMENT_OTHER): Payer: Self-pay | Admitting: Anesthesiology

## 2012-04-02 ENCOUNTER — Encounter (HOSPITAL_BASED_OUTPATIENT_CLINIC_OR_DEPARTMENT_OTHER)
Admission: RE | Disposition: A | Discharge status: Home / Self Care | Payer: Self-pay | Source: Ambulatory Visit | Attending: Urology

## 2012-04-02 ENCOUNTER — Encounter (HOSPITAL_BASED_OUTPATIENT_CLINIC_OR_DEPARTMENT_OTHER): Payer: Self-pay | Admitting: *Deleted

## 2012-04-02 ENCOUNTER — Ambulatory Visit (HOSPITAL_BASED_OUTPATIENT_CLINIC_OR_DEPARTMENT_OTHER)
Admission: RE | Admit: 2012-04-02 | Discharge: 2012-04-02 | Disposition: A | Discharge status: Home / Self Care | Payer: Medicare Other | Source: Ambulatory Visit | Attending: Urology | Admitting: Urology

## 2012-04-02 DIAGNOSIS — Z7901 Long term (current) use of anticoagulants: Secondary | ICD-10-CM | POA: Insufficient documentation

## 2012-04-02 DIAGNOSIS — I10 Essential (primary) hypertension: Secondary | ICD-10-CM | POA: Insufficient documentation

## 2012-04-02 DIAGNOSIS — Z79899 Other long term (current) drug therapy: Secondary | ICD-10-CM | POA: Insufficient documentation

## 2012-04-02 DIAGNOSIS — E119 Type 2 diabetes mellitus without complications: Secondary | ICD-10-CM | POA: Insufficient documentation

## 2012-04-02 DIAGNOSIS — Z86711 Personal history of pulmonary embolism: Secondary | ICD-10-CM | POA: Insufficient documentation

## 2012-04-02 DIAGNOSIS — R339 Retention of urine, unspecified: Secondary | ICD-10-CM | POA: Diagnosis present

## 2012-04-02 DIAGNOSIS — J45909 Unspecified asthma, uncomplicated: Secondary | ICD-10-CM | POA: Insufficient documentation

## 2012-04-02 HISTORY — DX: Localized edema: R60.0

## 2012-04-02 HISTORY — DX: Weakness: R53.1

## 2012-04-02 HISTORY — PX: CYSTOSCOPY: SHX5120

## 2012-04-02 HISTORY — DX: Unspecified osteoarthritis, unspecified site: M19.90

## 2012-04-02 HISTORY — DX: Hypothyroidism, unspecified: E03.9

## 2012-04-02 HISTORY — DX: Hyperlipidemia, unspecified: E78.5

## 2012-04-02 HISTORY — DX: Personal history of pulmonary embolism: Z86.711

## 2012-04-02 HISTORY — DX: Changes in skin texture: R23.4

## 2012-04-02 HISTORY — DX: Retention of urine, unspecified: R33.9

## 2012-04-02 HISTORY — PX: INSERTION OF SUPRAPUBIC CATHETER: SHX5870

## 2012-04-02 HISTORY — DX: Atherosclerotic heart disease of native coronary artery without angina pectoris: I25.10

## 2012-04-02 HISTORY — DX: Personal history of other diseases of the circulatory system: Z86.79

## 2012-04-02 HISTORY — DX: Dizziness and giddiness: R42

## 2012-04-02 HISTORY — DX: Unspecified hearing loss, unspecified ear: H91.90

## 2012-04-02 HISTORY — DX: Noninfective gastroenteritis and colitis, unspecified: K52.9

## 2012-04-02 HISTORY — DX: Presence of urogenital implants: Z96.0

## 2012-04-02 HISTORY — DX: Presence of other specified devices: Z97.8

## 2012-04-02 HISTORY — DX: Reserved for inherently not codable concepts without codable children: IMO0001

## 2012-04-02 HISTORY — DX: Long term (current) use of anticoagulants: Z79.01

## 2012-04-02 HISTORY — DX: Personal history of other venous thrombosis and embolism: Z86.718

## 2012-04-02 LAB — GLUCOSE, CAPILLARY
Glucose-Capillary: 156 mg/dL — ABNORMAL HIGH (ref 70–99)
Glucose-Capillary: 180 mg/dL — ABNORMAL HIGH (ref 70–99)

## 2012-04-02 LAB — BASIC METABOLIC PANEL
Calcium: 9.1 mg/dL (ref 8.4–10.5)
Chloride: 104 mEq/L (ref 96–112)
Creatinine, Ser: 1.5 mg/dL — ABNORMAL HIGH (ref 0.50–1.10)
GFR calc Af Amer: 35 mL/min — ABNORMAL LOW (ref >90)
Sodium: 140 mEq/L (ref 135–145)

## 2012-04-02 LAB — PROTIME-INR
INR: 1.09 (ref 0.00–1.49)
Prothrombin Time: 14 seconds (ref 11.6–15.2)

## 2012-04-02 LAB — POCT HEMOGLOBIN-HEMACUE: Hemoglobin: 11.3 g/dL — ABNORMAL LOW (ref 12.0–15.0)

## 2012-04-02 SURGERY — INSERTION, SUPRAPUBIC CATHETER
Anesthesia: General | Site: Bladder | Wound class: Clean Contaminated

## 2012-04-02 MED ORDER — BUPIVACAINE-EPINEPHRINE 0.25% -1:200000 IJ SOLN
INTRAMUSCULAR | Status: DC | PRN
  Administered 2012-04-02: 15 mL

## 2012-04-02 MED ORDER — HYDROCODONE-ACETAMINOPHEN 7.5-325 MG PO TABS: 1.0000 | ORAL_TABLET | ORAL | Status: DC | PRN

## 2012-04-02 MED ORDER — FENTANYL CITRATE 0.05 MG/ML IJ SOLN
25.0000 ug | INTRAMUSCULAR | Status: DC | PRN
  Filled 2012-04-02: qty 1

## 2012-04-02 MED ORDER — CEPHALEXIN 250 MG PO CAPS: 250.0000 mg | ORAL_CAPSULE | Freq: Two times a day (BID) | ORAL | Status: DC

## 2012-04-02 MED ORDER — STERILE WATER FOR IRRIGATION IR SOLN
Status: DC | PRN
  Administered 2012-04-02: 3000 mL

## 2012-04-02 MED ORDER — LIDOCAINE HCL (CARDIAC) 20 MG/ML IV SOLN
INTRAVENOUS | Status: DC | PRN
  Administered 2012-04-02: 50 mg via INTRAVENOUS

## 2012-04-02 MED ORDER — PROPOFOL 10 MG/ML IV BOLUS
INTRAVENOUS | Status: DC | PRN
  Administered 2012-04-02: 20 mg via INTRAVENOUS
  Administered 2012-04-02: 80 mg via INTRAVENOUS

## 2012-04-02 MED ORDER — LACTATED RINGERS IV SOLN
INTRAVENOUS | Status: DC
  Administered 2012-04-02: 07:00:00 via INTRAVENOUS
  Filled 2012-04-02: qty 1000

## 2012-04-02 MED ORDER — FENTANYL CITRATE 0.05 MG/ML IJ SOLN
INTRAMUSCULAR | Status: DC | PRN
  Administered 2012-04-02 (×2): 25 ug via INTRAVENOUS

## 2012-04-02 MED ORDER — CIPROFLOXACIN IN D5W 400 MG/200ML IV SOLN
400.0000 mg | INTRAVENOUS | Status: AC
  Administered 2012-04-02: 400 mg via INTRAVENOUS
  Filled 2012-04-02: qty 200

## 2012-04-02 MED ORDER — LACTATED RINGERS IV SOLN
INTRAVENOUS | Status: DC
  Filled 2012-04-02: qty 1000

## 2012-04-02 SURGICAL SUPPLY — 37 items
BAG DRAIN URO-CYSTO SKYTR STRL (DRAIN) ×3 IMPLANT
BAG DRN ANRFLXCHMBR STRAP LEK (BAG)
BAG DRN UROCATH (DRAIN) ×2
BAG URINE DRAINAGE (UROLOGICAL SUPPLIES) ×2 IMPLANT
BAG URINE LEG 19OZ MD ST LTX (BAG) IMPLANT
BLADE SURG ROTATE 9660 (MISCELLANEOUS) IMPLANT
CANISTER SUCT LVC 12 LTR MEDI- (MISCELLANEOUS) ×1 IMPLANT
CATH BONANNO SUPRAPUBIC 14G (CATHETERS) IMPLANT
CATH FOLEY 2WAY SLVR  5CC 16FR (CATHETERS)
CATH FOLEY 2WAY SLVR  5CC 18FR (CATHETERS)
CATH FOLEY 2WAY SLVR 5CC 16FR (CATHETERS) IMPLANT
CATH FOLEY 2WAY SLVR 5CC 18FR (CATHETERS) IMPLANT
CATH SILICONE 5CC 18FR (INSTRUMENTS) ×2 IMPLANT
CLOTH BEACON ORANGE TIMEOUT ST (SAFETY) ×3 IMPLANT
DRAPE CAMERA CLOSED 9X96 (DRAPES) ×3 IMPLANT
ELECT REM PT RETURN 9FT ADLT (ELECTROSURGICAL)
ELECTRODE REM PT RTRN 9FT ADLT (ELECTROSURGICAL) IMPLANT
GLOVE BIO SURGEON STRL SZ8 (GLOVE) ×3 IMPLANT
GLOVE INDICATOR 6.5 STRL GRN (GLOVE) ×2 IMPLANT
GLOVE INDICATOR 7.0 STRL GRN (GLOVE) ×2 IMPLANT
GOWN STRL REIN XL XLG (GOWN DISPOSABLE) ×3 IMPLANT
HOLDER FOLEY CATH W/STRAP (MISCELLANEOUS) ×1 IMPLANT
IV NS IRRIG 3000ML ARTHROMATIC (IV SOLUTION) IMPLANT
KIT SUPRAPUBIC CATH (MISCELLANEOUS) IMPLANT
NDL HYPO 25X1 1.5 SAFETY (NEEDLE) ×2 IMPLANT
NEEDLE HYPO 25X1 1.5 SAFETY (NEEDLE) ×6 IMPLANT
PACK CYSTOSCOPY (CUSTOM PROCEDURE TRAY) ×3 IMPLANT
PENCIL BUTTON HOLSTER BLD 10FT (ELECTRODE) IMPLANT
SPONGE GAUZE 4X4 12PLY (GAUZE/BANDAGES/DRESSINGS) ×2 IMPLANT
SUT SILK 0 TIES 10X30 (SUTURE) ×3 IMPLANT
SUT SILK 2 0 FS (SUTURE) IMPLANT
SUT VIC AB 3-0 SH 27 (SUTURE) ×3
SUT VIC AB 3-0 SH 27X BRD (SUTURE) ×1 IMPLANT
SYR CONTROL 10ML LL (SYRINGE) ×2 IMPLANT
SYRINGE IRR TOOMEY STRL 70CC (SYRINGE) IMPLANT
TAPE PAPER 2X10 WHT MICROPORE (GAUZE/BANDAGES/DRESSINGS) ×2 IMPLANT
WATER STERILE IRR 3000ML UROMA (IV SOLUTION) ×3 IMPLANT

## 2012-04-02 NOTE — Op Note (Signed)
PATIENT:  Krystal [name]  PRE-OPERATIVE DIAGNOSIS:  Urinary retention  POST-OPERATIVE DIAGNOSIS:  Same  PROCEDURE:  Procedure(s): 1. Cystoscopy 2. Suprapubic tube placement  SURGEON:  Garnett Farm  INDICATION: Krystal [name] is an 78 year old female with urinary retention. We discussed the indications for suprapubic tube placement. She presents today for open SP tube placement.  ANESTHESIA:  General  EBL:  None  DRAINS: 18 [place], nonlatex Foley catheter as a suprapubic tube  LOCAL MEDICATIONS USED:  1/4% Marcaine with epinephrine  Description of procedure: After informed consent the patient was taken to the major or, placed on the table and administered general anesthesia. She received Cipro IV and has been on Cipro orally at home for the past week. She was then moved the dorsal lithotomy position and her genitalia sterilely prepped and draped. Her lower abdomen was prepped as well and an official timeout was then performed.  The 17 French rigid cystoscope was then passed under direct vision into the bladder. There was some mild edema of the urethral meatus but no urethral lesions were identified. The bladder was entered and noted to have multiple pseudo-diverticuli. It was fully and systematically inspected with both the 12 and 70 lenses and no tumor stones or inflammatory lesions were identified. Ureteral orifices were of normal configuration and position.  I filled the bladder to capacity, placed the patient in Trendelenburg position and then inserted the Lowsley retractor. I was able to palpate this above the symphysis pubis. There was a scar in this location that appeared as if she had had a suprapubic tube previously although she gave no history of this. I injected local anesthetic in this location and incised the skin. I then used hemostats to spread the subcutaneous tissue and then directed the tip of the Lowsley tractor into the suprapubic incision. I cleared of surrounding tissue and  was able to see there was no apparent bowel present so this was incised over the Lowsley retractor and it was then advanced through the incision and out through the suprapubic incision. The 72 French Foley catheter was then placed in the jaws of the retractor and it was withdrawn into the bladder and out through the urethra and the catheter was released. It was pulled back into the bladder and the cystoscope was then reinserted and I identified the tip of the catheter within the bladder and inflated the balloon with 10 cc of water. There was no bleeding noted within the bladder.  I closed the skin by first placing 3-0 Vicryl suture and the subcutaneous tissue and then closing the skin from a inferior to superior direction with running, subcuticular 4-0 Vicryl. This allowed the suprapubic tube to be directed out through the superior aspect of the incision. It was secured to the skin with a single 2-0 silk suture. Neosporin was applied to the incision as well as sterile 4 x 4's and the catheter was hooked to closed system drainage. The patient was awakened and taken recovery room in stable and satisfactory condition. She tolerated the procedure well and there were no intraoperative complications with needle, sponge and instrument counts correct at the end of the operation.  PATIENT DISPOSITION:  PACU - hemodynamically stable.

## 2012-04-02 NOTE — Interval H&P Note (Signed)
History and Physical Interval Note:  04/02/2012 7:17 AM  Krystal [name]  has presented today for surgery, with the diagnosis of URINARY RETENTION  The various methods of treatment have been discussed with the patient and family. After consideration of risks, benefits and other options for treatment, the patient has consented to  Procedure(s) (LRB) with comments: INSERTION OF SUPRAPUBIC CATHETER (N/A) - SUPRAPUBIC TUBE PLACEMENT as a surgical intervention .  The patient's history has been reviewed, patient examined, no change in status, stable for surgery.  I have reviewed the patient's chart and labs.  Questions were answered to the patient's satisfaction.     Garnett Farm

## 2012-04-02 NOTE — Transfer of Care (Signed)
Immediate Anesthesia Transfer of Care Note  Patient: Krystal [name]  Procedure(s) Performed: Procedure(s) (LRB) with comments: INSERTION OF SUPRAPUBIC CATHETER (N/A) - SUPRAPUBIC TUBE PLACEMENT CYSTOSCOPY ()  Patient Location: PACU  Anesthesia Type:General  Level of Consciousness: sedated and patient cooperative  Airway & Oxygen Therapy: Patient Spontanous Breathing and Patient connected to face mask oxygen  Post-op Assessment: Report given to PACU RN  Post vital signs: Reviewed and stable  Complications: No apparent anesthesia complications

## 2012-04-02 NOTE — Anesthesia Procedure Notes (Signed)
Procedure Name: LMA Insertion Date/Time: 04/02/2012 7:49 AM Performed by: Maris Berger T Pre-anesthesia Checklist: Patient identified, Emergency Drugs available, Suction available and Patient being monitored Patient Re-evaluated:Patient Re-evaluated prior to inductionOxygen Delivery Method: Circle System Utilized Preoxygenation: Pre-oxygenation with 100% oxygen Intubation Type: IV induction Ventilation: Mask ventilation without difficulty LMA: LMA inserted LMA Size: 4.0 Number of attempts: 1 Placement Confirmation: positive ETCO2 Tube secured with: Tape Dental Injury: Teeth and Oropharynx as per pre-operative assessment

## 2012-04-02 NOTE — Anesthesia Postprocedure Evaluation (Signed)
  Anesthesia Post-op Note  Patient: Krystal [name]  Procedure(s) Performed: Procedure(s) (LRB): INSERTION OF SUPRAPUBIC CATHETER (N/A) CYSTOSCOPY ()  Patient Location: PACU  Anesthesia Type: General  Level of Consciousness: awake and alert   Airway and Oxygen Therapy: Patient Spontanous Breathing  Post-op Pain: mild  Post-op Assessment: Post-op Vital signs reviewed, Patient's Cardiovascular Status Stable, Respiratory Function Stable, Patent Airway and No signs of Nausea or vomiting  Post-op Vital Signs: stable  Complications: No apparent anesthesia complications

## 2012-04-03 LAB — URINE CULTURE: Colony Count: >=100000

## 2012-04-05 ENCOUNTER — Encounter (HOSPITAL_BASED_OUTPATIENT_CLINIC_OR_DEPARTMENT_OTHER): Payer: Self-pay | Admitting: Urology

## 2012-04-06 ENCOUNTER — Other Ambulatory Visit: Payer: Self-pay | Admitting: *Deleted

## 2012-04-06 MED ORDER — FUROSEMIDE 40 MG PO TABS: 40.0000 mg | ORAL_TABLET | Freq: Two times a day (BID) | ORAL | Status: DC

## 2012-04-13 ENCOUNTER — Ambulatory Visit: Payer: Medicare Other | Admitting: Nurse Practitioner

## 2012-04-27 ENCOUNTER — Encounter: Payer: Self-pay | Admitting: Nurse Practitioner

## 2012-04-27 ENCOUNTER — Ambulatory Visit (INDEPENDENT_AMBULATORY_CARE_PROVIDER_SITE_OTHER): Payer: Medicare Other | Admitting: Nurse Practitioner

## 2012-04-27 VITALS — BP 130/70 | HR 68 | Ht 63.0 in | Wt 187.1 lb

## 2012-04-27 DIAGNOSIS — I10 Essential (primary) hypertension: Secondary | ICD-10-CM

## 2012-04-27 NOTE — Progress Notes (Addendum)
Krystal [name] Date of Birth: 1925-02-04 Medical Record #[id number]  History of Present Illness: Krystal [name] is seen back today for a follow up visit. She is a former patient of Dr. Ronnald Nian. She has minimal CAD per cath back in 2006. Now 78 years of age. Has HTN, DVt, on coumadin, chronic swelling. Most recently developed inability to void due to rectocele, cystocele and past bladder tacking. Now with suprapubic cath in place. Has some mild renal insufficiency. We treated her UTI at her last visit. Now off of antibiotics.   She comes back today. She is here with her daughter. She has good days and bad days. She is depressed. No chest pain. Tries to elevate her legs. Gets too much salt from fast food that is brought in to her. She is not cooking but still lives alone. Able to make her bed every morning. Daughter says they find her crying more and more. Seeing her PCP later this week for depression and hope to start meds.   Current Outpatient Prescriptions on File Prior to Visit  Medication Sig Dispense Refill  . cetirizine (ZYRTEC) 10 MG chewable tablet Chew 10 mg by mouth daily.      Marland Kitchen diltiazem (CARDIZEM) 120 MG tablet Take 120 mg by mouth 2 (two) times daily.      . ferrous sulfate 325 (65 FE) MG tablet Take 325 mg by mouth at bedtime.       . Fluticasone-Salmeterol (ADVAIR DISKUS) 100-50 MCG/DOSE AEPB Inhale 1 puff into the lungs as needed. 2 puffs BID      . furosemide (LASIX) 40 MG tablet Take 1 tablet (40 mg total) by mouth 2 (two) times daily.  60 tablet  11  . glimepiride (AMARYL) 4 MG tablet Take 4 mg by mouth daily.       Marland Kitchen HYDROcodone-acetaminophen (NORCO) 7.5-325 MG per tablet Take 1-2 tablets by mouth every 4 (four) hours as needed for pain.  12 tablet  0  . Loperamide HCl (IMODIUM PO) Take by mouth as needed.      Marland Kitchen losartan (COZAAR) 100 MG tablet Take 100 mg by mouth daily.      . meclizine (ANTIVERT) 25 MG tablet Take 12.5 mg by mouth 2 (two) times daily.       . metoCLOPramide  (REGLAN) 5 MG tablet Take 2.5 mg by mouth 2 (two) times daily. 1/2 tablet 4 times daily      . omeprazole (PRILOSEC) 40 MG capsule Take 40 mg by mouth every evening.       Marland Kitchen oxybutynin (DITROPAN XL) 10 MG 24 hr tablet Take 1 tablet (10 mg total) by mouth daily.  10 tablet  0  . pravastatin (PRAVACHOL) 20 MG tablet Take 20 mg by mouth daily.      . sitaGLIPtan-metformin (JANUMET) 50-500 MG per tablet Take 1 tablet by mouth 2 (two) times daily with a meal.       . traMADol (ULTRAM) 50 MG tablet Take 50 mg by mouth every 6 (six) hours as needed. Pain      . vitamin B-12 (CYANOCOBALAMIN) 1000 MCG tablet Take 1,000 mcg by mouth daily.      Marland Kitchen warfarin (COUMADIN) 2 MG tablet Take 2 mg by mouth as directed.         Allergies  Allergen Reactions  . Latex Hives  . Detrol (Tolterodine Tartrate) Hives  . Humibid La (Guaifenesin) Other (See Comments)    UNKNOWN  . Vioxx (Rofecoxib) Hives    Past Medical  History  Diagnosis Date  . Hypertension   . GERD (gastroesophageal reflux disease)   . Diabetes mellitus   . History of atrial fibrillation EPISODE YRS AGO  . Urinary retention     s/p suprapubic catheter November 2013  . Coronary artery disease cardiologist- dr Maylon Cos--  lov 09-12-2010 in epic    non-obstructinve min. cad  . Foley catheter in place   . Edema of lower extremity BILATERAL LEG  . Hyperlipidemia   . History of pulmonary embolus (PE) 2010--  TAKES COUMADIN  . History of DVT of lower extremity   . Anticoagulated on Coumadin   . Generalized weakness AGE RELATED--  USES WALKER  . Vertigo   . OA (osteoarthritis)   . Thin skin FRAGILE  . Hypothyroidism NO MEDS  . Impaired hearing BILATERAL AIDS  . Chronic diarrhea INTERMITTANT    Past Surgical History  Procedure Date  . Knee surgery   . Tubal ligation   . Abdominal hysterectomy   . Cardiac catheterization 09-27-2004  DR TENNENT    NORMAL LVF/ MILD IRREGULARITIES IN MID LAD W/ NO SIGNIFICANT OBSTRUCTIVE DISEASE  .  Abdominal hysterectomy 1994  (APPROX)    W/ BILATERAL SALPINGO-OOPHORECTOMY  . Cholecystectomy 2000  . Knee arthroscopy 1998  (APPROX)  . Bladder suspension 2002  (APPROX)    W/ ANTERIOR AND POSTERIOR REPAIR  . Cataract extraction w/ intraocular lens  implant, bilateral   . Transthoracic echocardiogram 02-23-2009  (DX W/ PE & PAF)    EF 65-70%/ MILD MR/ MODERATE TR/ MILDLY DILATED RA  &  LA/  RV SYSTOLIC FUNCTION MODERATLY REDUCED   . Insertion of suprapubic catheter 04/02/2012    Procedure: INSERTION OF SUPRAPUBIC CATHETER;  Surgeon: Garnett Farm, MD;  Location: [facility];  Service: Urology;  Laterality: N/A;  SUPRAPUBIC TUBE PLACEMENT  . Cystoscopy 04/02/2012    Procedure: CYSTOSCOPY;  Surgeon: Garnett Farm, MD;  Location: [facility];  Service: Urology;;    History  Smoking status  . Never [name]   Smokeless tobacco  . Current User  . Types: Snuff    Comment: DIPPED TOBACCO SINCE AGE 38    History  Alcohol Use No    Family History  Problem Relation Age of Onset  . Heart disease Mother   . Hypertension Mother     Review of Systems: The review of systems is per the HPI.  All other systems were reviewed and are negative.  Physical Exam: BP 130/70  Pulse 68  Ht 5\' 3"  (1.6 m)  Wt 187 lb 1.9 oz (84.877 kg)  BMI 33.15 kg/m2 Patient is an elderly female who is very pleasant and in no acute distress. She is not as tearful here for me today. Skin is warm and dry. Color is normal.  HEENT is unremarkable. Normocephalic/atraumatic. PERRL. Sclera are nonicteric. Neck is supple. No masses. No JVD. Lungs are clear. Cardiac exam shows a regular rate and rhythm. Abdomen is soft. Extremities are full with trace edema. Gait is not tested. She is in a wheelchair.  No gross neurologic deficits noted.  LABORATORY DATA: N/A    Chemistry      Component Value Date/Time   NA 140 04/02/2012 0650   K 3.5 04/02/2012 0650   CL 104 04/02/2012 0650   CO2 25  04/02/2012 0650   BUN 34* 04/02/2012 0650   CREATININE 1.50* 04/02/2012 0650      Component Value Date/Time   CALCIUM 9.1 04/02/2012 0650  ALKPHOS 81 03/25/2012 1040   AST 11 03/25/2012 1040   ALT 7 03/25/2012 1040   BILITOT 0.4 03/25/2012 1040     Lab Results  Component Value Date   WBC 11.3* 03/31/2012   HGB 11.3* 04/02/2012   HCT 35.6* 03/31/2012   MCV 85.9 03/31/2012   PLT 364.0 03/31/2012    Assessment / Plan: 1. Edema - most likely from excessive salt. Not able to put on support stockings. I have left her on her current dose of Lasix. Try to cut back on salt but this will be hard.   2. Recent suprapubic catheter placement - plan per urology  3. HTN - repeat blood pressure check by me is ok. I have left her on her current regimen.  4. Depression - seeing PCP later this week.  Overall, she seems to be holding her own. I have left her on her current regimen. She is seeing primary care later this week. I will be available as needed for follow up.  Patient is agreeable to this plan and will call if any problems develop in the interim.

## 2012-04-27 NOTE — Patient Instructions (Signed)
Stay on your current medicines  Try to watch your salt.   I will see you back as needed  Call the [facility] office at [phone] if you have any questions, problems or concerns.

## 2012-05-03 ENCOUNTER — Encounter: Payer: Self-pay | Admitting: Cardiology

## 2012-05-10 ENCOUNTER — Encounter: Payer: Self-pay | Admitting: Cardiology

## 2012-05-13 ENCOUNTER — Encounter: Payer: Self-pay | Admitting: Cardiology

## 2012-07-06 ENCOUNTER — Encounter: Payer: Self-pay | Admitting: Nurse Practitioner

## 2012-07-08 ENCOUNTER — Telehealth: Payer: Self-pay | Admitting: *Deleted

## 2012-07-08 NOTE — Telephone Encounter (Signed)
Kristi from Dr. Fredirick Maudlin office at [facility] calls b/c BNP had increased from 82 to 139. Pt was in their office on 2/18 with persistent cough, peripheral ankle edema & "states she can't lay down at night".  She did have a CXR done at pcp. This was per Kristi.  States pt is also coming in for a PT draw and they will do a nurse room visit and reassess pt. It was also recommended that pt have follow-up with cardiology. An appt was made with Norma Fredrickson for Monday.  Mylo Red RN

## 2012-07-12 ENCOUNTER — Ambulatory Visit (INDEPENDENT_AMBULATORY_CARE_PROVIDER_SITE_OTHER): Payer: Medicare Other | Admitting: Nurse Practitioner

## 2012-07-12 ENCOUNTER — Encounter: Payer: Self-pay | Admitting: Nurse Practitioner

## 2012-07-12 VITALS — BP 116/62 | HR 84 | Resp 20 | Ht 63.0 in | Wt 185.8 lb

## 2012-07-12 DIAGNOSIS — R0989 Other specified symptoms and signs involving the circulatory and respiratory systems: Secondary | ICD-10-CM

## 2012-07-12 DIAGNOSIS — R0600 Dyspnea, unspecified: Secondary | ICD-10-CM

## 2012-07-12 DIAGNOSIS — R0609 Other forms of dyspnea: Secondary | ICD-10-CM

## 2012-07-12 LAB — CBC WITH DIFFERENTIAL/PLATELET
Basophils Absolute: 0 10*3/uL (ref 0.0–0.1)
Basophils Relative: 0.5 % (ref 0.0–3.0)
Eosinophils Absolute: 0.2 10*3/uL (ref 0.0–0.7)
Eosinophils Relative: 1.8 % (ref 0.0–5.0)
HCT: 30.2 % — ABNORMAL LOW (ref 36.0–46.0)
Hemoglobin: 10.4 g/dL — ABNORMAL LOW (ref 12.0–15.0)
Lymphocytes Relative: 21.8 % (ref 12.0–46.0)
Lymphs Abs: 2.2 10*3/uL (ref 0.7–4.0)
MCHC: 34.5 g/dL (ref 30.0–36.0)
MCV: 81.5 fl (ref 78.0–100.0)
Monocytes Absolute: 0.7 10*3/uL (ref 0.1–1.0)
Monocytes Relative: 7.4 % (ref 3.0–12.0)
Neutro Abs: 6.9 10*3/uL (ref 1.4–7.7)
Neutrophils Relative %: 68.5 % (ref 43.0–77.0)
Platelets: 438 10*3/uL — ABNORMAL HIGH (ref 150.0–400.0)
RBC: 3.7 Mil/uL — ABNORMAL LOW (ref 3.87–5.11)
RDW: 14.3 % (ref 11.5–14.6)
WBC: 10.1 10*3/uL (ref 4.5–10.5)

## 2012-07-12 LAB — BASIC METABOLIC PANEL
BUN: 22 mg/dL (ref 6–23)
CO2: 23 mEq/L (ref 19–32)
Calcium: 8.7 mg/dL (ref 8.4–10.5)
Chloride: 106 mEq/L (ref 96–112)
Creatinine, Ser: 1.5 mg/dL — ABNORMAL HIGH (ref 0.4–1.2)
GFR: 36.38 mL/min — ABNORMAL LOW (ref >60.00)
Glucose, Bld: 161 mg/dL — ABNORMAL HIGH (ref 70–99)
Potassium: 3.8 mEq/L (ref 3.5–5.1)
Sodium: 141 mEq/L (ref 135–145)

## 2012-07-12 LAB — BRAIN NATRIURETIC PEPTIDE: Pro B Natriuretic peptide (BNP): 62 pg/mL (ref 0.0–100.0)

## 2012-07-12 MED ORDER — FUROSEMIDE 40 MG PO TABS: 60.0000 mg | ORAL_TABLET | Freq: Two times a day (BID) | ORAL | Status: DC

## 2012-07-12 NOTE — Patient Instructions (Addendum)
Increase the Lasix to 60 mg two times a day. I sent this to the drug store.  Elevate your legs as much as possible and avoid salt  Try wrapping the legs in extra large ACE wraps  We are checking labs today  We are going to arrange for an ultrasound of your heart  I will see her in about 10 days  Call the [facility] office at [phone] if you have any questions, problems or concerns.

## 2012-07-12 NOTE — Progress Notes (Signed)
Krystal [name] Date of Birth: 09-13-1924 Medical Record #[id number]  History of Present Illness: Krystal [name] is seen back today for a work in visit. She is a former patient of Dr. Ronnald Nian. She is 79 years of age. She has minimal CAD per cath back in 2006, HTN, DVT on coumadin, chronic swelling of her feet, CRI and now with suprapubic cath in place due to rectocele, cystocele and past bladder tack.   I saw her back in December. Seemed pretty depressed at that time but no pressing cardiac issues.  She comes back today. She is here with one of her daughters. She has not done well over the past 2 weeks. Has a dry cough. Having trouble sleeping at night. More short of breath. More pedal edema. Says she is elevating her legs. Not able to use support stockings. Trying to cut back on the salt. Saw her PCP last week. Daughter reports a CXR was done showing fluid around the heart. BNP went from 82 to 139 and she had some labs that showed her thyroid to be off. She was referred back here.   Current Outpatient Prescriptions on File Prior to Visit  Medication Sig Dispense Refill  . cetirizine (ZYRTEC) 10 MG chewable tablet Chew 10 mg by mouth daily.      Marland Kitchen diltiazem (CARDIZEM) 120 MG tablet Take 120 mg by mouth 2 (two) times daily.      . ferrous sulfate 325 (65 FE) MG tablet Take 325 mg by mouth at bedtime.       . Fluticasone-Salmeterol (ADVAIR DISKUS) 100-50 MCG/DOSE AEPB Inhale 1 puff into the lungs as needed. 2 puffs BID      . glimepiride (AMARYL) 4 MG tablet Take 4 mg by mouth daily.       Marland Kitchen HYDROcodone-acetaminophen (NORCO) 7.5-325 MG per tablet Take 1-2 tablets by mouth every 4 (four) hours as needed for pain.  12 tablet  0  . Loperamide HCl (IMODIUM PO) Take by mouth as needed.      Marland Kitchen losartan (COZAAR) 100 MG tablet Take 100 mg by mouth daily.      . meclizine (ANTIVERT) 25 MG tablet Take 12.5 mg by mouth 2 (two) times daily.       . metoCLOPramide (REGLAN) 5 MG tablet Take 2.5 mg by mouth 2 (two)  times daily. 1/2 tablet 4 times daily      . omeprazole (PRILOSEC) 40 MG capsule Take 40 mg by mouth every evening.       Marland Kitchen oxybutynin (DITROPAN XL) 10 MG 24 hr tablet Take 1 tablet (10 mg total) by mouth daily.  10 tablet  0  . pravastatin (PRAVACHOL) 20 MG tablet Take 20 mg by mouth daily.      . sitaGLIPtan-metformin (JANUMET) 50-500 MG per tablet Take 1 tablet by mouth 2 (two) times daily with a meal.       . vitamin B-12 (CYANOCOBALAMIN) 1000 MCG tablet Take 1,000 mcg by mouth daily.      . traMADol (ULTRAM) 50 MG tablet Take 50 mg by mouth every 6 (six) hours as needed. Pain      . warfarin (COUMADIN) 2 MG tablet Take 2 mg by mouth as directed.        No current facility-administered medications on file prior to visit.    Allergies  Allergen Reactions  . Latex Hives  . Detrol (Tolterodine Tartrate) Hives  . Humibid La (Guaifenesin) Other (See Comments)    UNKNOWN  . Vioxx (Rofecoxib) Hives  Past Medical History  Diagnosis Date  . Hypertension   . GERD (gastroesophageal reflux disease)   . Diabetes mellitus   . History of atrial fibrillation EPISODE YRS AGO  . Urinary retention     s/p suprapubic catheter November 2013  . Coronary artery disease cardiologist- dr Maylon Cos--  lov 09-12-2010 in epic    non-obstructinve min. cad  . Foley catheter in place   . Edema of lower extremity BILATERAL LEG  . Hyperlipidemia   . History of pulmonary embolus (PE) 2010--  TAKES COUMADIN  . History of DVT of lower extremity   . Anticoagulated on Coumadin   . Generalized weakness AGE RELATED--  USES WALKER  . Vertigo   . OA (osteoarthritis)   . Thin skin FRAGILE  . Hypothyroidism NO MEDS  . Impaired hearing BILATERAL AIDS  . Chronic diarrhea INTERMITTANT    Past Surgical History  Procedure Laterality Date  . Knee surgery    . Tubal ligation    . Abdominal hysterectomy    . Cardiac catheterization  09-27-2004  DR TENNENT    NORMAL LVF/ MILD IRREGULARITIES IN MID LAD W/ NO  SIGNIFICANT OBSTRUCTIVE DISEASE  . Abdominal hysterectomy  1994  (APPROX)    W/ BILATERAL SALPINGO-OOPHORECTOMY  . Cholecystectomy  2000  . Knee arthroscopy  1998  (APPROX)  . Bladder suspension  2002  (APPROX)    W/ ANTERIOR AND POSTERIOR REPAIR  . Cataract extraction w/ intraocular lens  implant, bilateral    . Transthoracic echocardiogram  02-23-2009  (DX W/ PE & PAF)    EF 65-70%/ MILD MR/ MODERATE TR/ MILDLY DILATED RA  &  LA/  RV SYSTOLIC FUNCTION MODERATLY REDUCED   . Insertion of suprapubic catheter  04/02/2012    Procedure: INSERTION OF SUPRAPUBIC CATHETER;  Surgeon: Garnett Farm, MD;  Location: [facility];  Service: Urology;  Laterality: N/A;  SUPRAPUBIC TUBE PLACEMENT  . Cystoscopy  04/02/2012    Procedure: CYSTOSCOPY;  Surgeon: Garnett Farm, MD;  Location: [facility];  Service: Urology;;    History  Smoking status  . Never [name]   Smokeless tobacco  . Current User  . Types: Snuff    Comment: DIPPED TOBACCO SINCE AGE 36    History  Alcohol Use No    Family History  Problem Relation Age of Onset  . Heart disease Mother   . Hypertension Mother     Review of Systems: The review of systems is per the HPI.  All other systems were reviewed and are negative.  Physical Exam: BP 116/62  Pulse 84  Resp 20  Ht 5\' 3"  (1.6 m)  Wt 185 lb 12 oz (84.256 kg)  BMI 32.91 kg/m2 Patient is a very pleasant elderly female who is in no acute distress. Skin is warm and dry. Color is normal.  HEENT is unremarkable. Normocephalic/atraumatic. PERRL. Sclera are nonicteric. Neck is supple. No masses. No JVD. Lungs are totally clear. Cardiac exam shows a regular rate and rhythm. Heart tones are distant. Abdomen is soft. Extremities are with 1 to 2+ pedal edema. Gait and ROM are intact. She is using a walker. No gross neurologic deficits noted.  LABORATORY DATA: Pending    Chemistry      Component Value Date/Time   NA 140 04/02/2012 0650   K 3.5  04/02/2012 0650   CL 104 04/02/2012 0650   CO2 25 04/02/2012 0650   BUN 34* 04/02/2012 0650   CREATININE 1.50* 04/02/2012 1610  Component Value Date/Time   CALCIUM 9.1 04/02/2012 0650   ALKPHOS 81 03/25/2012 1040   AST 11 03/25/2012 1040   ALT 7 03/25/2012 1040   BILITOT 0.4 03/25/2012 1040      Echo Study Conclusions from October 2010  1. Left ventricle: The cavity size was normal. Wall thickness was normal. Systolic function was vigorous. The estimated ejection fraction was in the range of 65% to 70%. Regional wall motion abnormalities cannot be excluded. 2. Ventricular septum: The contour showed diastolic flattening. 3. Mitral valve: Mild regurgitation. 4. Left atrium: The atrium was mildly dilated. 5. Right ventricle: The cavity size was moderately dilated. Systolic function was moderately reduced. 6. Right atrium: The atrium was mildly dilated. 7. Tricuspid valve: Moderate regurgitation. 8. Pulmonary arteries: Systolic pressure was moderately increased. 9. Pericardium, extracardiac: A trivial pericardial effusion was identified posterior to the heart.  Assessment / Plan: 1. Swelling - known valvular heart disease - now with more edema. Would have hoped we could manage conservatively. Will go ahead and update the echo. Increase the Lasix to 60 mg BID. I will repeat the BNP and BMET. I will see her back in about 10 days. Have asked the family to try and wrap her legs in ACE wraps.   2. HTN - blood pressure is ok.  3. Indwelling suprapubic cath  Patient is agreeable to this plan and will call if any problems develop in the interim.

## 2012-07-19 ENCOUNTER — Other Ambulatory Visit (HOSPITAL_COMMUNITY): Payer: Medicare Other

## 2012-07-21 ENCOUNTER — Other Ambulatory Visit: Payer: Self-pay | Admitting: Internal Medicine

## 2012-07-21 DIAGNOSIS — R7989 Other specified abnormal findings of blood chemistry: Secondary | ICD-10-CM

## 2012-07-23 ENCOUNTER — Ambulatory Visit: Payer: Medicare Other | Admitting: Nurse Practitioner

## 2012-07-27 ENCOUNTER — Ambulatory Visit (HOSPITAL_COMMUNITY): Payer: Medicare Other | Attending: Cardiology

## 2012-07-27 DIAGNOSIS — R0989 Other specified symptoms and signs involving the circulatory and respiratory systems: Secondary | ICD-10-CM | POA: Insufficient documentation

## 2012-07-27 DIAGNOSIS — R0609 Other forms of dyspnea: Secondary | ICD-10-CM | POA: Insufficient documentation

## 2012-07-27 DIAGNOSIS — R0600 Dyspnea, unspecified: Secondary | ICD-10-CM

## 2012-07-27 NOTE — Progress Notes (Signed)
Echocardiogram performed.  

## 2012-07-30 ENCOUNTER — Ambulatory Visit (INDEPENDENT_AMBULATORY_CARE_PROVIDER_SITE_OTHER): Payer: Medicare Other | Admitting: Nurse Practitioner

## 2012-07-30 ENCOUNTER — Encounter: Payer: Self-pay | Admitting: Nurse Practitioner

## 2012-07-30 VITALS — BP 120/54 | HR 80 | Ht 63.0 in | Wt 180.8 lb

## 2012-07-30 DIAGNOSIS — R609 Edema, unspecified: Secondary | ICD-10-CM

## 2012-07-30 LAB — CBC WITH DIFFERENTIAL/PLATELET
Basophils Absolute: 0 10*3/uL (ref 0.0–0.1)
Basophils Relative: 0.4 % (ref 0.0–3.0)
Eosinophils Absolute: 0.3 10*3/uL (ref 0.0–0.7)
Eosinophils Relative: 2.9 % (ref 0.0–5.0)
HCT: 33.2 % — ABNORMAL LOW (ref 36.0–46.0)
Hemoglobin: 11.3 g/dL — ABNORMAL LOW (ref 12.0–15.0)
Lymphocytes Relative: 23.2 % (ref 12.0–46.0)
Lymphs Abs: 2.5 10*3/uL (ref 0.7–4.0)
MCHC: 34 g/dL (ref 30.0–36.0)
MCV: 83.1 fl (ref 78.0–100.0)
Monocytes Absolute: 0.9 10*3/uL (ref 0.1–1.0)
Monocytes Relative: 8 % (ref 3.0–12.0)
Neutro Abs: 7.1 10*3/uL (ref 1.4–7.7)
Neutrophils Relative %: 65.5 % (ref 43.0–77.0)
Platelets: 334 10*3/uL (ref 150.0–400.0)
RBC: 3.99 Mil/uL (ref 3.87–5.11)
RDW: 15.1 % — ABNORMAL HIGH (ref 11.5–14.6)
WBC: 10.8 10*3/uL — ABNORMAL HIGH (ref 4.5–10.5)

## 2012-07-30 LAB — BASIC METABOLIC PANEL
BUN: 25 mg/dL — ABNORMAL HIGH (ref 6–23)
CO2: 26 mEq/L (ref 19–32)
Calcium: 9.1 mg/dL (ref 8.4–10.5)
Chloride: 103 mEq/L (ref 96–112)
Creatinine, Ser: 1.6 mg/dL — ABNORMAL HIGH (ref 0.4–1.2)
GFR: 32.71 mL/min — ABNORMAL LOW (ref >60.00)
Glucose, Bld: 186 mg/dL — ABNORMAL HIGH (ref 70–99)
Potassium: 4.7 mEq/L (ref 3.5–5.1)
Sodium: 136 mEq/L (ref 135–145)

## 2012-07-30 NOTE — Progress Notes (Signed)
Krystal [name] Date of Birth: February 03, 1925 Medical Record #[id number]  History of Present Illness: Krystal [name] is seen back today for a follow up visit. She is a former patient of Dr. Ronnald Nian. She is 79 years old. Has minimal CAD per cath back in 2006, HTN, DVT on coumadin, CRi, and chronic edema. Has had a recent suprapubic cath placed due to rectocele, cystocele and past bladder tack.   I saw her 2 weeks ago for increased swelling. BNP was NOT elevated. She was more anemic. We have updated her echo and it is basically ok. She is not able to use support stockings. Trying to cut back on her salt.   She comes in today. She is here with her daughter. She has gotten more confused since I last saw her. Family has been having to stay more with her at night. More forgetful. Her weight is down about 5 pounds. Not short of breath. Not falling and using a new walker now. No chest pain. Seeing Dr. Clarene Duke next week. Family is starting to look at assisted living options. They have been given her some type of sedative at night. I cautioned her about that. No falls. Family is using ACE wraps to her lower legs and have had a good response.   Current Outpatient Prescriptions on File Prior to Visit  Medication Sig Dispense Refill  . cetirizine (ZYRTEC) 10 MG chewable tablet Chew 10 mg by mouth daily.      Marland Kitchen diltiazem (CARDIZEM) 120 MG tablet Take 120 mg by mouth 2 (two) times daily.      . ferrous sulfate 325 (65 FE) MG tablet Take 325 mg by mouth at bedtime.       . Fluticasone-Salmeterol (ADVAIR DISKUS) 100-50 MCG/DOSE AEPB Inhale 1 puff into the lungs as needed. 2 puffs BID      . furosemide (LASIX) 40 MG tablet Take 1.5 tablets (60 mg total) by mouth 2 (two) times daily.  90 tablet  11  . glimepiride (AMARYL) 4 MG tablet Take 4 mg by mouth daily.       Marland Kitchen HYDROcodone-acetaminophen (NORCO) 7.5-325 MG per tablet Take 1-2 tablets by mouth every 4 (four) hours as needed for pain.  12 tablet  0  . Loperamide HCl  (IMODIUM PO) Take by mouth as needed.      Marland Kitchen losartan (COZAAR) 100 MG tablet Take 100 mg by mouth daily.      . meclizine (ANTIVERT) 25 MG tablet Take 12.5 mg by mouth 2 (two) times daily.       . metoCLOPramide (REGLAN) 5 MG tablet Take 2.5 mg by mouth 2 (two) times daily. 1/2 tablet 4 times daily      . omeprazole (PRILOSEC) 40 MG capsule Take 40 mg by mouth every evening.       Marland Kitchen oxybutynin (DITROPAN XL) 10 MG 24 hr tablet Take 1 tablet (10 mg total) by mouth daily.  10 tablet  0  . pravastatin (PRAVACHOL) 20 MG tablet Take 20 mg by mouth daily.      . sitaGLIPtan-metformin (JANUMET) 50-500 MG per tablet Take 1 tablet by mouth 2 (two) times daily with a meal.       . traMADol (ULTRAM) 50 MG tablet Take 50 mg by mouth every 6 (six) hours as needed. Pain      . vitamin B-12 (CYANOCOBALAMIN) 1000 MCG tablet Take 1,000 mcg by mouth daily.      Marland Kitchen warfarin (COUMADIN) 2 MG tablet Take 2 mg by mouth  as directed.        No current facility-administered medications on file prior to visit.    Allergies  Allergen Reactions  . Latex Hives  . Detrol (Tolterodine Tartrate) Hives  . Humibid La (Guaifenesin) Other (See Comments)    UNKNOWN  . Vioxx (Rofecoxib) Hives    Past Medical History  Diagnosis Date  . Hypertension   . GERD (gastroesophageal reflux disease)   . Diabetes mellitus   . History of atrial fibrillation EPISODE YRS AGO  . Urinary retention     s/p suprapubic catheter November 2013  . Coronary artery disease cardiologist- dr Maylon Cos--  lov 09-12-2010 in epic    non-obstructinve min. cad  . Foley catheter in place   . Edema of lower extremity BILATERAL LEG  . Hyperlipidemia   . History of pulmonary embolus (PE) 2010--  TAKES COUMADIN  . History of DVT of lower extremity   . Anticoagulated on Coumadin   . Generalized weakness AGE RELATED--  USES WALKER  . Vertigo   . OA (osteoarthritis)   . Thin skin FRAGILE  . Hypothyroidism NO MEDS  . Impaired hearing BILATERAL AIDS  .  Chronic diarrhea INTERMITTANT    Past Surgical History  Procedure Laterality Date  . Knee surgery    . Tubal ligation    . Abdominal hysterectomy    . Cardiac catheterization  09-27-2004  DR TENNENT    NORMAL LVF/ MILD IRREGULARITIES IN MID LAD W/ NO SIGNIFICANT OBSTRUCTIVE DISEASE  . Abdominal hysterectomy  1994  (APPROX)    W/ BILATERAL SALPINGO-OOPHORECTOMY  . Cholecystectomy  2000  . Knee arthroscopy  1998  (APPROX)  . Bladder suspension  2002  (APPROX)    W/ ANTERIOR AND POSTERIOR REPAIR  . Cataract extraction w/ intraocular lens  implant, bilateral    . Transthoracic echocardiogram  02-23-2009  (DX W/ PE & PAF)    EF 65-70%/ MILD MR/ MODERATE TR/ MILDLY DILATED RA  &  LA/  RV SYSTOLIC FUNCTION MODERATLY REDUCED   . Insertion of suprapubic catheter  04/02/2012    Procedure: INSERTION OF SUPRAPUBIC CATHETER;  Surgeon: Garnett Farm, MD;  Location: [facility];  Service: Urology;  Laterality: N/A;  SUPRAPUBIC TUBE PLACEMENT  . Cystoscopy  04/02/2012    Procedure: CYSTOSCOPY;  Surgeon: Garnett Farm, MD;  Location: [facility];  Service: Urology;;    History  Smoking status  . Never [name]   Smokeless tobacco  . Current User  . Types: Snuff    Comment: DIPPED TOBACCO SINCE AGE 62    History  Alcohol Use No    Family History  Problem Relation Age of Onset  . Heart disease Mother   . Hypertension Mother     Review of Systems: The review of systems is per the HPI.  All other systems were reviewed and are negative.  Physical Exam: BP 120/54  Pulse 80  Ht 5\' 3"  (1.6 m)  Wt 180 lb 12.8 oz (82.01 kg)  BMI 32.04 kg/m2 Patient is an elderly female who looks chronically ill but very pleasant and in no acute distress. She is down 5 pounds. Skin is warm and dry. Color is normal.  HEENT is unremarkable. Normocephalic/atraumatic. PERRL. Sclera are nonicteric. Neck is supple. No masses. No JVD. Lungs are clear. Cardiac exam shows a regular rate  and rhythm. Abdomen is soft. Extremities are full but with just trace edema. Gait and ROM are intact. She is using a walker. No gross  neurologic deficits noted.  LABORATORY DATA:   Chemistry      Component Value Date/Time   NA 141 07/12/2012 1228   K 3.8 07/12/2012 1228   CL 106 07/12/2012 1228   CO2 23 07/12/2012 1228   BUN 22 07/12/2012 1228   CREATININE 1.5* 07/12/2012 1228      Component Value Date/Time   CALCIUM 8.7 07/12/2012 1228   ALKPHOS 81 03/25/2012 1040   AST 11 03/25/2012 1040   ALT 7 03/25/2012 1040   BILITOT 0.4 03/25/2012 1040     Lab Results  Component Value Date   WBC 10.1 07/12/2012   HGB 10.4* 07/12/2012   HCT 30.2* 07/12/2012   MCV 81.5 07/12/2012   PLT 438.0* 07/12/2012    Echo Study Conclusions  - Left ventricle: The cavity size was normal. Wall thickness was increased in a pattern of mild LVH. The estimated ejection fraction was 60%. Wall motion was normal; there were no regional wall motion abnormalities. Doppler parameters are consistent with abnormal left ventricular relaxation (grade 1 diastolic dysfunction). - Right ventricle: The cavity size was normal. Systolic function was normal.  Assessment / Plan: 1. Diastolic heart failure - normal EF - would keep her on her current regimen. Recheck her labs today. Will be available prn. I think more of her issues are related to just general aging. I agree with her family's decision to look at options. Does not appear safe for her to remain at her home alone.   2. Chronic edema - family has been wrapping her legs with good results. I have left her on her regular medicines for now. Would encourage the ACE wraps as well.   3. HTN - blood pressure is ok.  Patient is agreeable to this plan and will call if any problems develop in the interim.   Rosalio Macadamia, RN, ANP-C [facility] [address] [place], [place]  [address]

## 2012-07-30 NOTE — Patient Instructions (Signed)
Your ultrasound of your heart looks ok.  Stay on your current medicines  I will see you back as needed  Call the [facility] office at [phone] if you have any questions, problems or concerns.

## 2012-08-03 ENCOUNTER — Encounter (HOSPITAL_COMMUNITY): Payer: Medicare Other

## 2012-08-04 ENCOUNTER — Encounter (HOSPITAL_COMMUNITY): Payer: Medicare Other

## 2012-08-19 ENCOUNTER — Encounter (HOSPITAL_COMMUNITY)
Admission: RE | Admit: 2012-08-19 | Discharge: 2012-08-19 | Disposition: A | Discharge status: Home / Self Care | Payer: Medicare Other | Source: Ambulatory Visit | Attending: Internal Medicine | Admitting: Internal Medicine

## 2012-08-19 DIAGNOSIS — R799 Abnormal finding of blood chemistry, unspecified: Secondary | ICD-10-CM | POA: Insufficient documentation

## 2012-08-19 DIAGNOSIS — R7989 Other specified abnormal findings of blood chemistry: Secondary | ICD-10-CM

## 2012-08-20 ENCOUNTER — Encounter (HOSPITAL_COMMUNITY)
Admission: RE | Admit: 2012-08-20 | Discharge: 2012-08-20 | Disposition: A | Discharge status: Home / Self Care | Payer: Medicare Other | Source: Ambulatory Visit | Attending: Internal Medicine | Admitting: Internal Medicine

## 2012-08-20 ENCOUNTER — Encounter (HOSPITAL_COMMUNITY): Payer: Self-pay

## 2012-08-20 MED ORDER — SODIUM PERTECHNETATE TC 99M INJECTION
11.0000 | Freq: Once | INTRAVENOUS | Status: AC | PRN
  Administered 2012-08-20: 11 via INTRAVENOUS

## 2012-08-20 MED ORDER — SODIUM IODIDE I 131 CAPSULE
14.8000 | Freq: Once | INTRAVENOUS | Status: AC | PRN
  Administered 2012-08-19: 14.8 via ORAL

## 2012-09-15 ENCOUNTER — Encounter: Payer: Self-pay | Admitting: Nurse Practitioner

## 2012-09-21 ENCOUNTER — Telehealth: Payer: Self-pay | Admitting: Nurse Practitioner

## 2012-09-21 DIAGNOSIS — R0600 Dyspnea, unspecified: Secondary | ICD-10-CM

## 2012-09-21 NOTE — Telephone Encounter (Signed)
I have been unable to locate faxed labs in our office. I called Dr. Fredirick Maudlin office and asked them to refax to [phone].

## 2012-09-21 NOTE — Telephone Encounter (Signed)
News problem    Pt is having problems with extreme swelling of legs and feet and edema. Please call pt

## 2012-09-21 NOTE — Telephone Encounter (Signed)
I spoke with the patient's family member. They report increasing bilateral lower extremity edema over the past 2 weeks. They report the patient saw her PCP Dr. Clarene Duke, on 4/30 and he increased her lasix to 80 mg BID. The patient was complaining of some redness and itching to her lower extremities which Dr. Clarene Duke felt was related to her edema. Per the patient's family, they have been trying to ACE wrap her lower extremities, but her itching and redness is so bad now that this is very irritating to the patient. The family has given her benadryl and used hydrocortisone cream as well with very little relief. She is keeping her lower extremities elevated and decreasing the sodium in her diet. Per the family, they contacted Dr. Fredirick Maudlin office today and instructions were to increase lasix to 120 mg in the am and 80 mg in the evening. They were also instructed to contact cardiology. Labs were drawn at Dr. Fredirick Maudlin office last week. I have informed the family I will get a copy of these results and talk with the DOD regarding the patient's symptoms. I will call her back after this. She is agreeable. Sherri Rad, RN, BSN   Spoke with Dr. Fredirick Maudlin office. Labs will faxed to me. Sherri Rad, RN, BSN

## 2012-09-21 NOTE — Telephone Encounter (Signed)
Left another message to call back

## 2012-09-21 NOTE — Telephone Encounter (Signed)
Labs received in office. Most recent BUN 30 and creat 1.66 on 09/15/2012. Reviewed with Dr. Antoine Poche (office DOD) and pt should have BMP checked in our office. She should not increase furosemide until results of BMP available. I tried to reach pt's daughter and left message for her to call back

## 2012-09-22 ENCOUNTER — Encounter: Payer: Self-pay | Admitting: Cardiovascular Disease

## 2012-09-22 ENCOUNTER — Telehealth: Payer: Self-pay | Admitting: *Deleted

## 2012-09-22 ENCOUNTER — Other Ambulatory Visit (INDEPENDENT_AMBULATORY_CARE_PROVIDER_SITE_OTHER): Payer: Medicare Other

## 2012-09-22 ENCOUNTER — Other Ambulatory Visit: Payer: Self-pay | Admitting: Nurse Practitioner

## 2012-09-22 ENCOUNTER — Ambulatory Visit (INDEPENDENT_AMBULATORY_CARE_PROVIDER_SITE_OTHER): Payer: Medicare Other | Admitting: Cardiovascular Disease

## 2012-09-22 VITALS — BP 106/54 | HR 79 | Ht 60.0 in | Wt 190.0 lb

## 2012-09-22 DIAGNOSIS — R609 Edema, unspecified: Secondary | ICD-10-CM

## 2012-09-22 DIAGNOSIS — R0989 Other specified symptoms and signs involving the circulatory and respiratory systems: Secondary | ICD-10-CM

## 2012-09-22 LAB — BASIC METABOLIC PANEL
BUN: 31 mg/dL — ABNORMAL HIGH (ref 6–23)
CO2: 26 mEq/L (ref 19–32)
Chloride: 100 mEq/L (ref 96–112)
Creatinine, Ser: 1.9 mg/dL — ABNORMAL HIGH (ref 0.4–1.2)

## 2012-09-22 MED ORDER — CEPHALEXIN 500 MG PO CAPS: 500.0000 mg | ORAL_CAPSULE | Freq: Three times a day (TID) | ORAL | Status: DC

## 2012-09-22 MED ORDER — HYDROXYZINE HCL 10 MG PO TABS: 10.0000 mg | ORAL_TABLET | Freq: Three times a day (TID) | ORAL | Status: DC | PRN

## 2012-09-22 NOTE — Telephone Encounter (Signed)
Patient called spoke to daughter Bonita Quin she said her mothers swelling worse in lower legs.Stated she has developed a red rash on lower legs and itching.Stated she called PCP 09/21/12 and was advised to increase lasix to 120 mg am and 80 mg pm,Daughter was told DOD Dr.Hochrein advised 09/21/12 patient needs to have a bmet before increasing lasix.Daughter will bring patient to office today for a bmet.Daughter wants mother to be seen, will call back with a appointment.

## 2012-09-22 NOTE — Progress Notes (Signed)
HPI:  79 year-old woman seen for an add-on evaluation today. She has HTN, hx of DVT, CKD, and chronic edema.  She comes in today because of worsening leg swelling, itching, and redness. She has increased diuretics but this hasn't improved things. She denies dyspnea, orthopnea, or PND. No fever, chills, nausea, or vomiting. She's here today with her daughter.  Outpatient Encounter Prescriptions as of 09/22/2012  Medication Sig Dispense Refill  . ALPRAZolam (XANAX) 0.25 MG tablet Take 0.25 mg by mouth at bedtime as needed for sleep (1/2 tab as needed).      . betamethasone dipropionate (DIPROLENE) 0.05 % cream Apply 0.05 % topically daily.      . cetirizine (ZYRTEC) 10 MG chewable tablet Chew 10 mg by mouth daily.      Marland Kitchen diltiazem (CARDIZEM) 120 MG tablet Take 120 mg by mouth 2 (two) times daily.      . ferrous sulfate 325 (65 FE) MG tablet Take 325 mg by mouth at bedtime.       . Fluticasone-Salmeterol (ADVAIR DISKUS) 100-50 MCG/DOSE AEPB Inhale 1 puff into the lungs as needed. 2 puffs BID      . furosemide (LASIX) 40 MG tablet Take 80 mg by mouth 2 (two) times daily.      Marland Kitchen glimepiride (AMARYL) 4 MG tablet Take 4 mg by mouth daily.       Marland Kitchen HYDROcodone-acetaminophen (NORCO) 7.5-325 MG per tablet Take 1-2 tablets by mouth every 4 (four) hours as needed for pain.  12 tablet  0  . Loperamide HCl (IMODIUM PO) Take by mouth as needed.      Marland Kitchen losartan (COZAAR) 100 MG tablet Take 100 mg by mouth daily.      . meclizine (ANTIVERT) 25 MG tablet Take 12.5 mg by mouth 2 (two) times daily.       Marland Kitchen omeprazole (PRILOSEC) 40 MG capsule Take 40 mg by mouth every evening.       . pravastatin (PRAVACHOL) 20 MG tablet Take 20 mg by mouth daily.      . sitaGLIPtan-metformin (JANUMET) 50-500 MG per tablet Take 1 tablet by mouth 2 (two) times daily with a meal.       . traMADol (ULTRAM) 50 MG tablet Take 50 mg by mouth every 6 (six) hours as needed. Pain      . vitamin B-12 (CYANOCOBALAMIN) 1000 MCG tablet Take  1,000 mcg by mouth daily.      Marland Kitchen warfarin (COUMADIN) 2 MG tablet Take 2 mg by mouth as directed.       . [DISCONTINUED] furosemide (LASIX) 40 MG tablet Take 1.5 tablets (60 mg total) by mouth 2 (two) times daily.  90 tablet  11  . metoCLOPramide (REGLAN) 5 MG tablet Take 2.5 mg by mouth 2 (two) times daily. 1/2 tablet 4 times daily      . oxybutynin (DITROPAN XL) 10 MG 24 hr tablet Take 1 tablet (10 mg total) by mouth daily.  10 tablet  0   No facility-administered encounter medications on file as of 09/22/2012.    Allergies  Allergen Reactions  . Latex Hives  . Detrol (Tolterodine Tartrate) Hives  . Humibid La (Guaifenesin) Other (See Comments)    UNKNOWN  . Vioxx (Rofecoxib) Hives    Past Medical History  Diagnosis Date  . Hypertension   . GERD (gastroesophageal reflux disease)   . Diabetes mellitus   . History of atrial fibrillation EPISODE YRS AGO  . Urinary retention     s/p  suprapubic catheter November 2013  . Coronary artery disease cardiologist- dr Maylon Cos--  lov 09-12-2010 in epic    non-obstructinve min. cad  . Foley catheter in place   . Edema of lower extremity BILATERAL LEG  . Hyperlipidemia   . History of pulmonary embolus (PE) 2010--  TAKES COUMADIN  . History of DVT of lower extremity   . Anticoagulated on Coumadin   . Generalized weakness AGE RELATED--  USES WALKER  . Vertigo   . OA (osteoarthritis)   . Thin skin FRAGILE  . Hypothyroidism NO MEDS  . Impaired hearing BILATERAL AIDS  . Chronic diarrhea INTERMITTANT   ROS: Negative except as per HPI  BP 106/54  Pulse 79  Ht 5' (1.524 m)  Wt 86.183 kg (190 lb)  BMI 37.11 kg/m2  SpO2 99%  PHYSICAL EXAM: Pt is alert and oriented, pleasant elderly woman in NAD HEENT: normal Neck: JVP - normal, carotids 2+= without bruits Lungs: CTA bilaterally CV: RRR without murmur or gallop Abd: soft, NT, Positive BS, no hepatomegaly Ext: 1+ pretibial edema with erythema to below the knee  2D Echo: Left  ventricle: The cavity size was normal. Wall thickness was increased in a pattern of mild LVH. The estimated ejection fraction was 60%. Wall motion was normal; there were no regional wall motion abnormalities. Doppler parameters are consistent with abnormal left ventricular relaxation (grade 1 diastolic dysfunction).  ------------------------------------------------------------ Aortic valve: Structurally normal valve. Cusp separation was normal. Doppler: Transvalvular velocity was within the normal range. There was no stenosis. No regurgitation.  ------------------------------------------------------------ Aorta: Aortic root: The aortic root was normal in size.  ------------------------------------------------------------ Mitral valve: Structurally normal valve. Leaflet separation was normal. Doppler: Transvalvular velocity was within the normal range. There was no evidence for stenosis. No regurgitation. Peak gradient: 2mm Hg (D).  ------------------------------------------------------------ Left atrium: The atrium was normal in size.  ------------------------------------------------------------ Right ventricle: The cavity size was normal. Systolic function was normal.  ------------------------------------------------------------ Pulmonic valve: The valve appears to be grossly normal. Doppler: No significant regurgitation.  ------------------------------------------------------------ Tricuspid valve: The valve appears to be grossly normal. Doppler: Mild regurgitation.  ------------------------------------------------------------ Right atrium: The atrium was at the upper limits of normal in size.  ------------------------------------------------------------ Pericardium: There was no pericardial effusion.  ------------------------------------------------------------ Systemic veins: Inferior vena cava: The vessel was normal in size; the respirophasic diameter changes were  in the normal range (= 50%); findings are consistent with normal central venous pressure.  ASSESSMENT AND PLAN: Chronic edema - likely multifactorial, with evidence of possible cellulitis. Suspect venous insufficiency, immobility is primary problem. I would not recommend increasing diuretics more considering her CKD and risk for acute kidney injury. Will check a BMET today.   She unfortunately cannot tolerate compression in the form of stockings or ACE wraps. She will continue with leg elevation as tolerated. Advised strict sodium restriction which she is not currently doing.   Will prescribe Keflex 500 mg TID for cellulitis which I think is complicating matters. Also wrote Rx for Atarax prn itching.   Will contact her after reviewing her BMET in case further diuretic reduction is necessary.  Tonny Bollman 09/22/2012 1:35 PM

## 2012-09-22 NOTE — Telephone Encounter (Signed)
lmovm for pts daughter to call me back the dosage of pts antivert

## 2012-09-22 NOTE — Telephone Encounter (Signed)
Spoke to DOD Dr.Cooper he will see patient today at 12:15 pm.

## 2012-09-22 NOTE — Patient Instructions (Signed)
Your physician recommends that you have lab work today: BMP  Your physician has recommended you make the following change in your medication: START Atarax as needed for itching, START Keflex 500mg  take one by mouth three times a day for 7 days, Continue current dose of Furosemide

## 2012-09-22 NOTE — Telephone Encounter (Signed)
Follow Up     Pt daughter calling back returning phone call. Please call back.

## 2012-09-24 ENCOUNTER — Other Ambulatory Visit: Payer: Self-pay | Admitting: *Deleted

## 2012-09-24 ENCOUNTER — Telehealth: Payer: Self-pay | Admitting: *Deleted

## 2012-09-24 MED ORDER — POTASSIUM CHLORIDE ER 20 MEQ PO TBCR: 20.0000 meq | EXTENDED_RELEASE_TABLET | Freq: Every day | ORAL | Status: DC

## 2012-09-24 MED ORDER — MECLIZINE HCL 25 MG PO TABS: 12.5000 mg | ORAL_TABLET | Freq: Two times a day (BID) | ORAL | Status: DC

## 2012-09-24 NOTE — Telephone Encounter (Signed)
S/w pt's daughter after I s/w Julieta Gutting, RN pt will continue on Klor-con ( 20 meq) daily, will have to get the script filled by Dr. Fredirick Maudlin office, stop otc potassium and I will fill antivert ( 12.5 mg ) bid sent in script to [facility]

## 2012-09-24 NOTE — Telephone Encounter (Signed)
S/w pt's daughter about antivert pt is taking (12.5 mg) bid, also stated needed another med called in Klor-con ( 20 meq) daily, I don't see this listed on pt's chart. Waiting for lab results to be reviewed by Doctor and will discuss with Julieta Gutting, RN who saw pt on 09/22/12 will see if pt should be taking potassium. Also stated pt takes otc potassium pill. Stated pts legs were looking better yesterday and worse today

## 2012-09-27 ENCOUNTER — Other Ambulatory Visit: Payer: Self-pay

## 2012-09-27 DIAGNOSIS — R609 Edema, unspecified: Secondary | ICD-10-CM

## 2012-09-28 ENCOUNTER — Encounter (HOSPITAL_BASED_OUTPATIENT_CLINIC_OR_DEPARTMENT_OTHER): Payer: Medicare Other | Attending: General Surgery

## 2012-09-28 ENCOUNTER — Telehealth: Payer: Self-pay | Admitting: Cardiovascular Disease

## 2012-09-28 DIAGNOSIS — L97909 Non-pressure chronic ulcer of unspecified part of unspecified lower leg with unspecified severity: Secondary | ICD-10-CM | POA: Insufficient documentation

## 2012-09-28 DIAGNOSIS — I87319 Chronic venous hypertension (idiopathic) with ulcer of unspecified lower extremity: Secondary | ICD-10-CM | POA: Insufficient documentation

## 2012-09-28 NOTE — Telephone Encounter (Signed)
New problem    Per pts daughter pts legs are continuing to swell and she's not getting any relief and she wants to know if she should take her to the ER

## 2012-09-28 NOTE — Telephone Encounter (Signed)
I spoke with the pt's daughter and the pt is currently scheduled to see the wound clinic this afternoon for evaluation of swelling in legs.  I told Bonita Quin that if the pt can try and wait until she is seen this afternoon then they can determine if the pt needs to be admitted to the hospital. If the pt cannot wait until appointment then she can proceed to the ER. At this time Bonita Quin is going to try and have the pt wait to be seen by the wound clinic.

## 2012-09-29 NOTE — H&P (Signed)
Krystal [name], Krystal [name] NO.:  [id number]  MEDICAL RECORD NO.:  [id number]  LOCATION:  FOOT                         FACILITY:  [facility]  PHYSICIAN:  Joanne Gavel, M.D.        DATE OF BIRTH:  1924-09-29  DATE OF ADMISSION:  09/28/2012 DATE OF DISCHARGE:                             HISTORY & PHYSICAL   CHIEF COMPLAINT:  Swelling and weeping, both legs.  HISTORY OF PRESENT ILLNESS:  This 79 year old female, in the last 3 weeks, has developed a great deal of swelling of both lower extremities, increased redness in skin, drainage vertically from the left leg.  She has a history of DVT, diabetes type 2.  She has had no fevers, chills, or sweating spells.  She has seen her medical doctor who thinks that further diuresis is somewhat dangerous at the present time.  PAST MEDICAL HISTORY:  Includes hypertension, GERD, atrial fibrillation, urinary retention, and coronary artery disease.  She has a history of vertigo, osteoarthritis, hypothyroidism, and impaired hearing.  SOCIAL HISTORY:  Cigarettes none.  Alcohol none.  MEDICATIONS:  Xanax, Zyrtec, Cardizem, Feosol, Advair, Lasix, Amaryl, hydrocodone, loperamide, Cozaar, __________, Prilosec, Primacor, metformin, tramadol, Coumadin, Lasix, Reglan, and recently Keflex has been added.  ALLERGIES:  DETROL, LASIX, VIOXX, and GUAIFENESIN.  REVIEW OF SYSTEMS:  The patient has had a recent echocardiogram which revealed an ejection fraction of 60%, and is a relatively normal echocardiogram.  PHYSICAL EXAMINATION:  VITAL SIGNS:  Temperature 97.8, pulse 86 and slightly irregular, respirations 18, blood pressure 125/63. GENERAL:  The patient is a well developed, a little pale. CHEST:  Clear. HEART:  Slightly irregular. EXTREMITIES:  Examination of the lower extremities reveals an ABI of 1.0 on the right and 1.1 on the left.  Both legs are shiny and swollen. There are tiny vesicles which are broken in the anterior left foreleg. I see  no openings on the right leg.  IMPRESSION:  Probably chronic venous stasis, or less venous, or at least venous hypertension, evidence of cellulitis is not strong.  PLAN:  The patient says that she cannot tolerate much pressure.  We will try silver alginate, Kerlix, and Coban, and TCA cream for the itching. We will see her in 7 days.  Venous studies have been ordered.     Joanne Gavel, M.D.     RA/MEDQ  D:  09/28/2012  T:  09/29/2012  Job:  [id number]  cc:   Veverly Fells. Excell Seltzer, MD

## 2012-10-01 ENCOUNTER — Encounter (INDEPENDENT_AMBULATORY_CARE_PROVIDER_SITE_OTHER): Payer: Medicare Other

## 2012-10-01 ENCOUNTER — Other Ambulatory Visit: Payer: Self-pay | Admitting: Cardiovascular Disease

## 2012-10-01 DIAGNOSIS — R609 Edema, unspecified: Secondary | ICD-10-CM

## 2012-10-04 NOTE — Telephone Encounter (Signed)
This med should be refilled by PCP at this time.

## 2012-10-04 NOTE — Telephone Encounter (Signed)
Dr. Excell Seltzer filled last time seeing if this med needs to be filled through primary care for further refills

## 2012-12-01 ENCOUNTER — Ambulatory Visit: Payer: Medicare Other | Admitting: Nurse Practitioner

## 2012-12-20 ENCOUNTER — Encounter: Payer: Self-pay | Admitting: Nurse Practitioner

## 2012-12-20 ENCOUNTER — Ambulatory Visit (INDEPENDENT_AMBULATORY_CARE_PROVIDER_SITE_OTHER): Payer: Medicare Other | Admitting: Nurse Practitioner

## 2012-12-20 VITALS — BP 130/60 | HR 58 | Ht 63.0 in | Wt 199.8 lb

## 2012-12-20 DIAGNOSIS — R609 Edema, unspecified: Secondary | ICD-10-CM

## 2012-12-20 NOTE — Patient Instructions (Signed)
Continue with current medicines  I will see you back as needed  Call the [facility] office at [phone] if you have any questions, problems or concerns.

## 2012-12-20 NOTE — Progress Notes (Signed)
Krystal [name] Date of Birth: 1924-09-18 Medical Record #[id number]  History of Present Illness: Krystal [name] is seen back today for a 3 month check. She is a former patient of Dr. Ronnald Nian. She is 79 years old. Has minimal CAD per cath back in 2006, HTN, DVT on coumadin, CRi, and chronic edema. Has had a recent suprapubic cath placed due to rectocele, cystocele and past bladder tack.   Seen back in early March for increased swelling. BNP was NOT elevated. She was more anemic. We have updated her echo and it is basically ok. She is not able to use support stockings. Trying to cut back on her salt.   Saw Dr. Excell Seltzer in May for the same problem - swelling in her legs - he did not recommend increasing diuretics which would only worsen her kidney disease.   Comes back today. Here with her daughter. Krystal [name]. She is doing ok. Has had some progressive dementia. Now on antidepressant therapy. Living in a retirement center and soon will have a CNA coming for 80 hours per month. She is going to need more care as this goes on. Her swelling is about the same. They are trying to wrap her legs. Labs have been checked by PCP. She denies being short of breath and denies chest pain.    Current Outpatient Prescriptions  Medication Sig Dispense Refill  . ALPRAZolam (XANAX) 0.25 MG tablet Take 0.25 mg by mouth at bedtime as needed for sleep (1/2 tab as needed).      . betamethasone dipropionate (DIPROLENE) 0.05 % cream Apply 0.05 % topically daily.      . cephALEXin (KEFLEX) 500 MG capsule Take 1 capsule (500 mg total) by mouth 3 (three) times daily.  21 capsule  0  . cetirizine (ZYRTEC) 10 MG chewable tablet Chew 10 mg by mouth daily.      Marland Kitchen diltiazem (CARDIZEM) 120 MG tablet Take 120 mg by mouth 2 (two) times daily.      . ferrous sulfate 325 (65 FE) MG tablet Take 325 mg by mouth at bedtime.       Marland Kitchen Fesoterodine Fumarate (TOVIAZ PO) Take 8 mg by mouth 2 (two) times daily.      . Fluticasone-Salmeterol (ADVAIR DISKUS)  100-50 MCG/DOSE AEPB Inhale 1 puff into the lungs as needed. 2 puffs BID      . furosemide (LASIX) 40 MG tablet Take 2 tablets (80 mg total) by mouth daily.  1 tablet    . glimepiride (AMARYL) 4 MG tablet Take 4 mg by mouth daily.       Marland Kitchen HYDROcodone-acetaminophen (NORCO) 7.5-325 MG per tablet Take 1-2 tablets by mouth every 4 (four) hours as needed for pain.  12 tablet  0  . hydrOXYzine (ATARAX/VISTARIL) 10 MG tablet Take 1 tablet (10 mg total) by mouth 3 (three) times daily as needed for itching.  30 tablet  0  . Loperamide HCl (IMODIUM PO) Take by mouth as needed.      Marland Kitchen losartan (COZAAR) 100 MG tablet Take 100 mg by mouth daily.      . meclizine (ANTIVERT) 25 MG tablet Take 0.5 tablets (12.5 mg total) by mouth 2 (two) times daily.  30 tablet  6  . metoCLOPramide (REGLAN) 5 MG tablet Take 2.5 mg by mouth 2 (two) times daily. 1/2 tablet 4 times daily      . mirtazapine (REMERON) 15 MG tablet Take 15 mg by mouth at bedtime.      Marland Kitchen omeprazole (PRILOSEC) 40  MG capsule Take 40 mg by mouth every evening.       Marland Kitchen oxybutynin (DITROPAN XL) 10 MG 24 hr tablet Take 1 tablet (10 mg total) by mouth daily.  10 tablet  0  . potassium chloride 20 MEQ TBCR Take 20 mEq by mouth daily.  30 tablet  0  . pravastatin (PRAVACHOL) 20 MG tablet Take 20 mg by mouth daily.      . sitaGLIPtin (JANUVIA) 50 MG tablet Take 50 mg by mouth daily.      . traMADol (ULTRAM) 50 MG tablet Take 50 mg by mouth every 6 (six) hours as needed. Pain      . vitamin B-12 (CYANOCOBALAMIN) 1000 MCG tablet Take 1,000 mcg by mouth daily.      Marland Kitchen warfarin (COUMADIN) 2 MG tablet Take 2 mg by mouth as directed.        No current facility-administered medications for this visit.    Allergies  Allergen Reactions  . Latex Hives  . Detrol (Tolterodine Tartrate) Hives  . Humibid La (Guaifenesin) Other (See Comments)    UNKNOWN  . Vioxx (Rofecoxib) Hives    Past Medical History  Diagnosis Date  . Hypertension   . GERD (gastroesophageal  reflux disease)   . Diabetes mellitus   . History of atrial fibrillation EPISODE YRS AGO  . Urinary retention     s/p suprapubic catheter November 2013  . Coronary artery disease cardiologist- dr Maylon Cos--  lov 09-12-2010 in epic    non-obstructinve min. cad  . Foley catheter in place   . Edema of lower extremity BILATERAL LEG  . Hyperlipidemia   . History of pulmonary embolus (PE) 2010--  TAKES COUMADIN  . History of DVT of lower extremity   . Anticoagulated on Coumadin   . Generalized weakness AGE RELATED--  USES WALKER  . Vertigo   . OA (osteoarthritis)   . Thin skin FRAGILE  . Hypothyroidism NO MEDS  . Impaired hearing BILATERAL AIDS  . Chronic diarrhea INTERMITTANT    Past Surgical History  Procedure Laterality Date  . Knee surgery    . Tubal ligation    . Abdominal hysterectomy    . Cardiac catheterization  09-27-2004  DR TENNENT    NORMAL LVF/ MILD IRREGULARITIES IN MID LAD W/ NO SIGNIFICANT OBSTRUCTIVE DISEASE  . Abdominal hysterectomy  1994  (APPROX)    W/ BILATERAL SALPINGO-OOPHORECTOMY  . Cholecystectomy  2000  . Knee arthroscopy  1998  (APPROX)  . Bladder suspension  2002  (APPROX)    W/ ANTERIOR AND POSTERIOR REPAIR  . Cataract extraction w/ intraocular lens  implant, bilateral    . Transthoracic echocardiogram  02-23-2009  (DX W/ PE & PAF)    EF 65-70%/ MILD MR/ MODERATE TR/ MILDLY DILATED RA  &  LA/  RV SYSTOLIC FUNCTION MODERATLY REDUCED   . Insertion of suprapubic catheter  04/02/2012    Procedure: INSERTION OF SUPRAPUBIC CATHETER;  Surgeon: Garnett Farm, MD;  Location[facility];  Service: Urology;  Laterality: N/A;  SUPRAPUBIC TUBE PLACEMENT  . Cystoscopy  04/02/2012    Procedure: CYSTOSCOPY;  Surgeon: Garnett Farm, MD;  Location[facility];  Service: Urology;;    History  Smoking status  . Never [name]   Smokeless tobacco  . Current User  . Types: Snuff    Comment: DIPPED TOBACCO SINCE AGE 22    History    Alcohol Use No    Family History  Problem Relation Age  of Onset  . Heart disease Mother   . Hypertension Mother     Review of Systems: The review of systems is per the HPI.  All other systems were reviewed and are negative.  Physical Exam: BP 130/60  Pulse 58  Ht 5\' 3"  (1.6 m)  Wt 199 lb 12.8 oz (90.629 kg)  BMI 35.4 kg/m2 Patient is very pleasant and in no acute distress. She is hard of hearing. Skin is warm and dry. Color is normal.  HEENT is unremarkable. Normocephalic/atraumatic. PERRL. Sclera are nonicteric. Neck is supple. No masses. No JVD. Lungs are clear. Cardiac exam shows a regular rate and rhythm. Heart tones are distant. Abdomen is soft. Extremities are with 2 to 3+ chronic edema. Gait and ROM are intact. No gross neurologic deficits noted.  LABORATORY DATA:   Chemistry      Component Value Date/Time   NA 136 09/22/2012 1330   K 4.8 09/22/2012 1330   CL 100 09/22/2012 1330   CO2 26 09/22/2012 1330   BUN 31* 09/22/2012 1330   CREATININE 1.9* 09/22/2012 1330      Component Value Date/Time   CALCIUM 8.9 09/22/2012 1330   ALKPHOS 81 03/25/2012 1040   AST 11 03/25/2012 1040   ALT 7 03/25/2012 1040   BILITOT 0.4 03/25/2012 1040     Lab Results  Component Value Date   WBC 10.8* 07/30/2012   HGB 11.3* 07/30/2012   HCT 33.2* 07/30/2012   MCV 83.1 07/30/2012   PLT 334.0 07/30/2012   Echo Study Conclusions from March of 2014  - Left ventricle: The cavity size was normal. Wall thickness was increased in a pattern of mild LVH. The estimated ejection fraction was 60%. Wall motion was normal; there were no regional wall motion abnormalities. Doppler parameters are consistent with abnormal left ventricular relaxation (grade 1 diastolic dysfunction). - Right ventricle: The cavity size was normal. Systolic function was normal.   Assessment / Plan: 1. Chronic swelling/diastolic HF - would continue with her current plan of care - leave her on the same dose of diuretics - hopefully  with more help coming to her home she will do better in regards to elevation of her legs and wrapping her legs.   2. Dementia - she is quite pleasant - now requiring more care - I will see her back as needed.   Patient is agreeable to this plan and will call if any problems develop in the interim.   Rosalio Macadamia, RN, ANP-C [facility] [address] [place], [place]  [address]

## 2012-12-30 ENCOUNTER — Emergency Department (HOSPITAL_COMMUNITY): Payer: Medicare Other

## 2012-12-30 ENCOUNTER — Emergency Department (HOSPITAL_COMMUNITY)
Admission: EM | Admit: 2012-12-30 | Discharge: 2012-12-30 | Disposition: A | Discharge status: Home / Self Care | Payer: Medicare Other | Attending: Emergency Medicine | Admitting: Emergency Medicine

## 2012-12-30 ENCOUNTER — Encounter (HOSPITAL_COMMUNITY): Payer: Self-pay | Admitting: *Deleted

## 2012-12-30 DIAGNOSIS — Z862 Personal history of diseases of the blood and blood-forming organs and certain disorders involving the immune mechanism: Secondary | ICD-10-CM | POA: Insufficient documentation

## 2012-12-30 DIAGNOSIS — I2510 Atherosclerotic heart disease of native coronary artery without angina pectoris: Secondary | ICD-10-CM | POA: Insufficient documentation

## 2012-12-30 DIAGNOSIS — Z91040 Latex allergy status: Secondary | ICD-10-CM | POA: Insufficient documentation

## 2012-12-30 DIAGNOSIS — I10 Essential (primary) hypertension: Secondary | ICD-10-CM | POA: Insufficient documentation

## 2012-12-30 DIAGNOSIS — R1013 Epigastric pain: Secondary | ICD-10-CM

## 2012-12-30 DIAGNOSIS — R109 Unspecified abdominal pain: Secondary | ICD-10-CM | POA: Insufficient documentation

## 2012-12-30 DIAGNOSIS — Z86711 Personal history of pulmonary embolism: Secondary | ICD-10-CM | POA: Insufficient documentation

## 2012-12-30 DIAGNOSIS — Z9861 Coronary angioplasty status: Secondary | ICD-10-CM | POA: Insufficient documentation

## 2012-12-30 DIAGNOSIS — E785 Hyperlipidemia, unspecified: Secondary | ICD-10-CM | POA: Insufficient documentation

## 2012-12-30 DIAGNOSIS — N390 Urinary tract infection, site not specified: Secondary | ICD-10-CM

## 2012-12-30 DIAGNOSIS — M1990 Unspecified osteoarthritis, unspecified site: Secondary | ICD-10-CM | POA: Insufficient documentation

## 2012-12-30 DIAGNOSIS — Z79899 Other long term (current) drug therapy: Secondary | ICD-10-CM | POA: Insufficient documentation

## 2012-12-30 DIAGNOSIS — K219 Gastro-esophageal reflux disease without esophagitis: Secondary | ICD-10-CM | POA: Insufficient documentation

## 2012-12-30 DIAGNOSIS — Z8679 Personal history of other diseases of the circulatory system: Secondary | ICD-10-CM | POA: Insufficient documentation

## 2012-12-30 DIAGNOSIS — E119 Type 2 diabetes mellitus without complications: Secondary | ICD-10-CM | POA: Insufficient documentation

## 2012-12-30 DIAGNOSIS — Z86718 Personal history of other venous thrombosis and embolism: Secondary | ICD-10-CM | POA: Insufficient documentation

## 2012-12-30 DIAGNOSIS — Z8639 Personal history of other endocrine, nutritional and metabolic disease: Secondary | ICD-10-CM | POA: Insufficient documentation

## 2012-12-30 LAB — POCT I-STAT TROPONIN I: Troponin i, poc: 0 ng/mL (ref 0.00–0.08)

## 2012-12-30 LAB — PROTIME-INR
INR: 2.38 — ABNORMAL HIGH (ref 0.00–1.49)
Prothrombin Time: 25.2 seconds — ABNORMAL HIGH (ref 11.6–15.2)

## 2012-12-30 LAB — URINALYSIS, ROUTINE W REFLEX MICROSCOPIC
Glucose, UA: NEGATIVE mg/dL
Protein, ur: NEGATIVE mg/dL
Specific Gravity, Urine: 1.014 (ref 1.005–1.030)

## 2012-12-30 LAB — CBC WITH DIFFERENTIAL/PLATELET
Eosinophils Absolute: 0.3 10*3/uL (ref 0.0–0.7)
Hemoglobin: 10.4 g/dL — ABNORMAL LOW (ref 12.0–15.0)
Lymphocytes Relative: 8 % — ABNORMAL LOW (ref 12–46)
Lymphs Abs: 1.2 10*3/uL (ref 0.7–4.0)
MCH: 27.5 pg (ref 26.0–34.0)
Neutro Abs: 11.2 10*3/uL — ABNORMAL HIGH (ref 1.7–7.7)
Neutrophils Relative %: 80 % — ABNORMAL HIGH (ref 43–77)
Platelets: 285 10*3/uL (ref 150–400)
RBC: 3.78 MIL/uL — ABNORMAL LOW (ref 3.87–5.11)
WBC: 14 10*3/uL — ABNORMAL HIGH (ref 4.0–10.5)

## 2012-12-30 LAB — COMPREHENSIVE METABOLIC PANEL
BUN: 31 mg/dL — ABNORMAL HIGH (ref 6–23)
CO2: 22 mEq/L (ref 19–32)
Calcium: 8.8 mg/dL (ref 8.4–10.5)
Chloride: 107 mEq/L (ref 96–112)
Creatinine, Ser: 1.36 mg/dL — ABNORMAL HIGH (ref 0.50–1.10)
GFR calc Af Amer: 40 mL/min — ABNORMAL LOW (ref >90)
GFR calc non Af Amer: 34 mL/min — ABNORMAL LOW (ref >90)
Total Bilirubin: 0.2 mg/dL — ABNORMAL LOW (ref 0.3–1.2)

## 2012-12-30 LAB — URINE MICROSCOPIC-ADD ON

## 2012-12-30 MED ORDER — IOHEXOL 300 MG/ML  SOLN
25.0000 mL | INTRAMUSCULAR | Status: AC
  Administered 2012-12-30: 25 mL via ORAL

## 2012-12-30 MED ORDER — CEFAZOLIN SODIUM 1-5 GM-% IV SOLN
1.0000 g | Freq: Once | INTRAVENOUS | Status: AC
  Administered 2012-12-30: 1 g via INTRAVENOUS
  Filled 2012-12-30: qty 50

## 2012-12-30 MED ORDER — ONDANSETRON HCL 4 MG/2ML IJ SOLN
4.0000 mg | Freq: Once | INTRAMUSCULAR | Status: AC
  Administered 2012-12-30: 4 mg via INTRAVENOUS
  Filled 2012-12-30: qty 2

## 2012-12-30 MED ORDER — IOHEXOL 300 MG/ML  SOLN
80.0000 mL | Freq: Once | INTRAMUSCULAR | Status: AC | PRN
  Administered 2012-12-30: 80 mL via INTRAVENOUS

## 2012-12-30 MED ORDER — FENTANYL CITRATE 0.05 MG/ML IJ SOLN
50.0000 ug | Freq: Once | INTRAMUSCULAR | Status: AC
  Administered 2012-12-30: 50 ug via INTRAVENOUS
  Filled 2012-12-30: qty 2

## 2012-12-30 MED ORDER — CEPHALEXIN 500 MG PO CAPS: 500.0000 mg | ORAL_CAPSULE | Freq: Two times a day (BID) | ORAL | Status: DC

## 2012-12-30 NOTE — ED Provider Notes (Signed)
CSN: [id number]     Arrival date & time 12/30/12  0455 History     First MD Initiated Contact with Patient 12/30/12 0507     Chief Complaint  Patient presents with  . Abdominal Pain   (Consider location/radiation/quality/duration/timing/severity/associated sxs/prior Treatment) HPI Krystal [name] is a 79 y.o. female with an extensive medical and surgical history pertinent for hysterectomy as well as cholecystectomy presents with abdominal pain. She says early this morning she's had some burning in the epigastrium with nausea and vomiting. She woke up with this condition. Nothing makes it better movement and palpation makes it worse. Per her daughter she has been getting a more bloated abdomen over the last 2 days. Patient has a history of prior obstruction. Patient's pain is moderate, ongoing.  Received 4 mg of Zofran per EMS which did not help nausea. Denies dyuria, back pain, frequency, rash.   Past Medical History  Diagnosis Date  . Hypertension   . GERD (gastroesophageal reflux disease)   . Diabetes mellitus   . History of atrial fibrillation EPISODE YRS AGO  . Urinary retention     s/p suprapubic catheter November 2013  . Coronary artery disease cardiologist- dr Maylon Cos--  lov 09-12-2010 in epic    non-obstructinve min. cad  . Foley catheter in place   . Edema of lower extremity BILATERAL LEG  . Hyperlipidemia   . History of pulmonary embolus (PE) 2010--  TAKES COUMADIN  . History of DVT of lower extremity   . Anticoagulated on Coumadin   . Generalized weakness AGE RELATED--  USES WALKER  . Vertigo   . OA (osteoarthritis)   . Thin skin FRAGILE  . Hypothyroidism NO MEDS  . Impaired hearing BILATERAL AIDS  . Chronic diarrhea INTERMITTANT   Past Surgical History  Procedure Laterality Date  . Knee surgery    . Tubal ligation    . Abdominal hysterectomy    . Cardiac catheterization  09-27-2004  DR TENNENT    NORMAL LVF/ MILD IRREGULARITIES IN MID LAD W/ NO SIGNIFICANT  OBSTRUCTIVE DISEASE  . Abdominal hysterectomy  1994  (APPROX)    W/ BILATERAL SALPINGO-OOPHORECTOMY  . Cholecystectomy  2000  . Knee arthroscopy  1998  (APPROX)  . Bladder suspension  2002  (APPROX)    W/ ANTERIOR AND POSTERIOR REPAIR  . Cataract extraction w/ intraocular lens  implant, bilateral    . Transthoracic echocardiogram  02-23-2009  (DX W/ PE & PAF)    EF 65-70%/ MILD MR/ MODERATE TR/ MILDLY DILATED RA  &  LA/  RV SYSTOLIC FUNCTION MODERATLY REDUCED   . Insertion of suprapubic catheter  04/02/2012    Procedure: INSERTION OF SUPRAPUBIC CATHETER;  Surgeon: Garnett Farm, MD;  Location: [facility];  Service: Urology;  Laterality: N/A;  SUPRAPUBIC TUBE PLACEMENT  . Cystoscopy  04/02/2012    Procedure: CYSTOSCOPY;  Surgeon: Garnett Farm, MD;  Location: [facility];  Service: Urology;;   Family History  Problem Relation Age of Onset  . Heart disease Mother   . Hypertension Mother    History  Substance Use Topics  . Smoking status: Never Smoker   . Smokeless tobacco: Current User    Types: Snuff     Comment: DIPPED TOBACCO SINCE AGE 55  . Alcohol Use: No   OB History   Grav Para Term Preterm Abortions TAB SAB Ect Mult Living                 Review  of Systems At least 10pt or greater review of systems completed and are negative except where specified in the HPI.  Allergies  Latex; Detrol; Humibid la; and Vioxx  Home Medications   Current Outpatient Rx  Name  Route  Sig  Dispense  Refill  . ALPRAZolam (XANAX) 0.25 MG tablet   Oral   Take 0.25 mg by mouth at bedtime as needed for sleep (1/2 tab as needed).         . betamethasone dipropionate (DIPROLENE) 0.05 % cream   Topical   Apply 0.05 % topically daily.         . cephALEXin (KEFLEX) 500 MG capsule   Oral   Take 1 capsule (500 mg total) by mouth 3 (three) times daily.   21 capsule   0   . cetirizine (ZYRTEC) 10 MG chewable tablet   Oral   Chew 10 mg by mouth daily.          Marland Kitchen diltiazem (CARDIZEM) 120 MG tablet   Oral   Take 120 mg by mouth 2 (two) times daily.         . ferrous sulfate 325 (65 FE) MG tablet   Oral   Take 325 mg by mouth at bedtime.          Marland Kitchen Fesoterodine Fumarate (TOVIAZ PO)   Oral   Take 8 mg by mouth 2 (two) times daily.         . Fluticasone-Salmeterol (ADVAIR DISKUS) 100-50 MCG/DOSE AEPB   Inhalation   Inhale 1 puff into the lungs as needed. 2 puffs BID         . furosemide (LASIX) 40 MG tablet   Oral   Take 2 tablets (80 mg total) by mouth daily.   1 tablet      . glimepiride (AMARYL) 4 MG tablet   Oral   Take 4 mg by mouth daily.          Marland Kitchen HYDROcodone-acetaminophen (NORCO) 7.5-325 MG per tablet   Oral   Take 1-2 tablets by mouth every 4 (four) hours as needed for pain.   12 tablet   0   . hydrOXYzine (ATARAX/VISTARIL) 10 MG tablet   Oral   Take 1 tablet (10 mg total) by mouth 3 (three) times daily as needed for itching.   30 tablet   0   . Loperamide HCl (IMODIUM PO)   Oral   Take by mouth as needed.         Marland Kitchen losartan (COZAAR) 100 MG tablet   Oral   Take 100 mg by mouth daily.         . meclizine (ANTIVERT) 25 MG tablet   Oral   Take 0.5 tablets (12.5 mg total) by mouth 2 (two) times daily.   30 tablet   6   . metoCLOPramide (REGLAN) 5 MG tablet   Oral   Take 2.5 mg by mouth 2 (two) times daily. 1/2 tablet 4 times daily         . mirtazapine (REMERON) 15 MG tablet   Oral   Take 15 mg by mouth at bedtime.         Marland Kitchen omeprazole (PRILOSEC) 40 MG capsule   Oral   Take 40 mg by mouth every evening.          Marland Kitchen oxybutynin (DITROPAN XL) 10 MG 24 hr tablet   Oral   Take 1 tablet (10 mg total) by mouth daily.   10 tablet  0   . potassium chloride 20 MEQ TBCR   Oral   Take 20 mEq by mouth daily.   30 tablet   0   . pravastatin (PRAVACHOL) 20 MG tablet   Oral   Take 20 mg by mouth daily.         . sitaGLIPtin (JANUVIA) 50 MG tablet   Oral   Take 50 mg by mouth  daily.         . traMADol (ULTRAM) 50 MG tablet   Oral   Take 50 mg by mouth every 6 (six) hours as needed. Pain         . vitamin B-12 (CYANOCOBALAMIN) 1000 MCG tablet   Oral   Take 1,000 mcg by mouth daily.         Marland Kitchen warfarin (COUMADIN) 2 MG tablet   Oral   Take 2 mg by mouth as directed.           There were no vitals taken for this visit. Physical Exam  Nursing notes reviewed.  Electronic medical record reviewed. VITAL SIGNS:   Filed Vitals:   12/30/12 0515  BP: 122/65  Pulse: 85  Temp: 97.5 F (36.4 C)  TempSrc: Oral  Resp: 16  SpO2: 95%   CONSTITUTIONAL: Awake, oriented, appears non-toxic, vomiting intermittent HENT: Atraumatic, normocephalic, oral mucosa pink and moist, airway patent. Nares patent without drainage. External ears normal. EYES: Conjunctiva clear, EOMI, PERRLA NECK: Trachea midline, non-tender, supple CARDIOVASCULAR: Normal heart rate, Normal rhythm, No murmurs, rubs, gallops PULMONARY/CHEST: Clear to auscultation, no rhonchi, wheezes, or rales. Symmetrical breath sounds. Non-tender. ABDOMINAL: Obese. Slightly distended abdomen, hypertympanic,  tender to palpation in the epigastrium and upper quadrants. No rebound and no guarding.   BS normal. NEUROLOGIC: Non-focal, moving all four extremities, no gross sensory or motor deficits. EXTREMITIES: No clubbing, cyanosis, or edema SKIN: Warm, Dry, No erythema, No rash  ED Course   Procedures (including critical care time)  Date: 12/30/2012  Rate: 84  Rhythm: sinus rhythm  QRS Axis: normal  Intervals: normal  ST/T Wave abnormalities: normal  Conduction Disutrbances: none  Narrative Interpretation: no significant change from prior EKG dated 04/02/2012   Labs Reviewed  COMPREHENSIVE METABOLIC PANEL - Abnormal; Notable for the following:    Glucose, Bld 248 (*)    BUN 31 (*)    Creatinine, Ser 1.36 (*)    Total Protein 5.9 (*)    Albumin 3.0 (*)    Alkaline Phosphatase 119 (*)    Total  Bilirubin 0.2 (*)    GFR calc non Af Amer 34 (*)    GFR calc Af Amer 40 (*)    All other components within normal limits  CBC WITH DIFFERENTIAL - Abnormal; Notable for the following:    WBC 14.0 (*)    RBC 3.78 (*)    Hemoglobin 10.4 (*)    HCT 31.3 (*)    Neutrophils Relative % 80 (*)    Neutro Abs 11.2 (*)    Lymphocytes Relative 8 (*)    Monocytes Absolute 1.3 (*)    All other components within normal limits  URINALYSIS, ROUTINE W REFLEX MICROSCOPIC - Abnormal; Notable for the following:    APPearance CLOUDY (*)    Hgb urine dipstick MODERATE (*)    Nitrite POSITIVE (*)    Leukocytes, UA LARGE (*)    All other components within normal limits  PROTIME-INR - Abnormal; Notable for the following:    Prothrombin Time 25.2 (*)    INR  2.38 (*)    All other components within normal limits  URINE MICROSCOPIC-ADD ON - Abnormal; Notable for the following:    Bacteria, UA MANY (*)    All other components within normal limits  URINE CULTURE  CG4 I-STAT (LACTIC ACID)  POCT I-STAT TROPONIN I   Dg Abd 2 Views  12/30/2012   *RADIOLOGY REPORT*  Clinical Data: Abdominal pain.  Evaluate for obstruction.  ABDOMEN - 2 VIEW  Comparison: Abdominal radiographs of 08/03/2008.  Findings: Surgical clips project over the right upper quadrant of the abdomen, compatible with prior cholecystectomy. Gas and stool are seen scattered throughout the colon extending to the level of the distal rectum.  No pathologic distension of small bowel is noted.  No gross evidence of pneumoperitoneum.  IMPRESSION: 1.  Nonobstructive bowel gas pattern. 2.  No pneumoperitoneum.   Original Report Authenticated By: Trudie Reed, M.D.   1. UTI (lower urinary tract infection)   2. Epigastric pain     MDM  Krystal [name] is a 79 y.o. female presenting with epigastric pain, pt w/ h/o CAD, doubt ACS in this pt, EKG is NSR, Tropi is negative.  CMP is undiagnostic redemonstrating CRI, does have mild elevation in ALP, no elevation  in AST/ALT/Bili - s/p chole, could still be retained stone.  UA suggest UTI which would explain vomiting, likely epigastric discomfort from vomiting.  WBC elevated also - supports UTI as well.  No septic physiology. CT abd/pelvis pending, Dr Rubin Payor to disposition pt.  Plan now to give Ancef x1 for UTI (prior Cx of K pneumo S to 1st gen ceph, intermediate to macrobid, elect not to use FQ in elderly if possible.)  DC if CT neg and vomiting controlled.    Jones Skene, MD 12/31/12 [phone]

## 2012-12-30 NOTE — ED Provider Notes (Signed)
A. she was epigastric abdominal pain. CT shows some thickening of distal esophagus. Also his UTI. Will give antibiotics and will have follow up with her PCP. Cultures has been sent.  Juliet Rude. Rubin Payor, MD 12/30/12 [phone]

## 2012-12-30 NOTE — ED Notes (Signed)
Pt's previous catheter does not have port to allow access to pull urine, cath bag emptied x 1 and will continue to watch and catch urine.

## 2012-12-30 NOTE — ED Notes (Signed)
CT notified patient finished her cup of contrast.

## 2012-12-30 NOTE — ED Notes (Signed)
Patient transported to CT 

## 2012-12-30 NOTE — ED Notes (Signed)
Per EMS: pt has been complaining of epigastric pain with burning N/V for the last 45 min. Pt from home.

## 2013-01-01 LAB — URINE CULTURE: Colony Count: >=100000

## 2013-01-02 ENCOUNTER — Telehealth (HOSPITAL_COMMUNITY): Payer: Self-pay | Admitting: Emergency Medicine

## 2013-01-02 NOTE — Progress Notes (Signed)
ED Antimicrobial Stewardship Positive Culture Follow Up   Krystal [name] is an 79 y.o. female who presented to [facility] on 12/30/2012 with a chief complaint of  Chief Complaint  Patient presents with  . Abdominal Pain    Recent Results (from the past 720 hour(s))  URINE CULTURE     Status: None   Collection Time    12/30/12  7:00 AM      Result Value Range Status   Specimen Description URINE, CATHETERIZED   Final   Special Requests NONE   Final   Culture  Setup Time     Final   Value: 12/30/2012 13:05     Performed at [facility] Count     Final   Value: >=100,000 COLONIES/ML     Performed at [facility]   Culture     Final   Value: SERRATIA MARCESCENS     Performed at [facility]   Report Status 01/01/2013 FINAL   Final   Organism ID, Bacteria SERRATIA MARCESCENS   Final    [x]  Treated with Keflex, organism resistant to prescribed antimicrobial []  Patient discharged originally without antimicrobial agent and treatment is now indicated  New antibiotic prescription: Bactrim 1DS tablet PO twice daily for 7 days  ED Provider: Rhea Bleacher, PA-C   Krystal [name] 01/02/2013, 11:55 AM Infectious Diseases Pharmacist Phone# [phone]

## 2013-01-02 NOTE — ED Notes (Signed)
Post ED Visit - Positive Culture Follow-up: Successful Patient Follow-Up  Culture assessed and recommendations reviewed by: []  Wes Dulaney, Pharm.D., BCPS []  Celedonio Miyamoto, Pharm.D., BCPS []  Georgina Pillion, [address].D., BCPS []  [place], [address].D., BCPS, AAHIVP []  Estella Husk, Pharm.D., BCPS, AAHIVP [x]  Abran Duke, [address].D.  Positive urine culture  []  Patient discharged without antimicrobial prescription and treatment is now indicated [x]  Organism is resistant to prescribed ED discharge antimicrobial []  Patient with positive blood cultures  Changes discussed with ED provider: Rhea Bleacher PA-C New antibiotic prescription: Bactrim 1 DS tablet PO twice daily for 7 days    Kylie A Holland 01/02/2013, 12:56 PM

## 2013-02-01 ENCOUNTER — Telehealth: Payer: Self-pay | Admitting: Nurse Practitioner

## 2013-02-01 NOTE — Telephone Encounter (Signed)
Returned call to patient's daughter Bonita Quin no answer.LMTC.

## 2013-02-01 NOTE — Telephone Encounter (Signed)
Received call from patient's daughter Bonita Quin spoke to Norma Fredrickson NP she advised we cannot increase her lasix due to her kidney functions,continue to wrap legs,elevate,no salt.

## 2013-02-01 NOTE — Telephone Encounter (Signed)
New problem   Patients daughter Bonita Quin call re: her mother,said she has swelling in her tights for the last couple day.

## 2013-02-01 NOTE — Telephone Encounter (Signed)
Follow up  Pt's daughter returning Cheryl's call.

## 2013-02-01 NOTE — Telephone Encounter (Signed)
I don't really feel like we can increase the lasix with her kidney function  Needs to continue wrapping her legs, elevate and NO salt.

## 2013-02-01 NOTE — Telephone Encounter (Signed)
Returned call to patient's daughter Linda no answer.LMTC. 

## 2013-02-01 NOTE — Telephone Encounter (Signed)
Returned call to patient's daughter Bonita Quin.She stated mother is having increase swelling in upper legs from knees up.Stated mother is sob but no worse.Stated she wanted to let Norma Fredrickson NP know and get her advice on what to do.Message sent to [facility].

## 2013-02-22 ENCOUNTER — Encounter (HOSPITAL_BASED_OUTPATIENT_CLINIC_OR_DEPARTMENT_OTHER): Payer: Medicare Other | Attending: General Surgery

## 2013-02-22 DIAGNOSIS — I10 Essential (primary) hypertension: Secondary | ICD-10-CM | POA: Insufficient documentation

## 2013-02-22 DIAGNOSIS — Z79899 Other long term (current) drug therapy: Secondary | ICD-10-CM | POA: Insufficient documentation

## 2013-02-22 DIAGNOSIS — F0390 Unspecified dementia without behavioral disturbance: Secondary | ICD-10-CM | POA: Insufficient documentation

## 2013-02-22 DIAGNOSIS — I4891 Unspecified atrial fibrillation: Secondary | ICD-10-CM | POA: Insufficient documentation

## 2013-02-22 DIAGNOSIS — Z86711 Personal history of pulmonary embolism: Secondary | ICD-10-CM | POA: Insufficient documentation

## 2013-02-22 DIAGNOSIS — L89309 Pressure ulcer of unspecified buttock, unspecified stage: Secondary | ICD-10-CM | POA: Insufficient documentation

## 2013-02-22 DIAGNOSIS — E119 Type 2 diabetes mellitus without complications: Secondary | ICD-10-CM | POA: Insufficient documentation

## 2013-02-22 DIAGNOSIS — L8990 Pressure ulcer of unspecified site, unspecified stage: Secondary | ICD-10-CM | POA: Insufficient documentation

## 2013-02-23 NOTE — H&P (Signed)
NAMEROCQUEL, ASKREN NO.:  [id number]  MEDICAL RECORD NO.:  [id number]  LOCATION:  FOOT                         FACILITY:  [facility]  PHYSICIAN:  Joanne Gavel, M.D.        DATE OF BIRTH:  1924-10-28  DATE OF ADMISSION:  02/22/2013 DATE OF DISCHARGE:                             HISTORY & PHYSICAL   CHIEF COMPLAINT:  Sores on buttocks.  HISTORY OF PRESENT ILLNESS:  An 79 year old female, bed and chair ridden.  She does walk with a lot of assistance.  Eight months ago, she developed a wound on one of her buttocks.  This was waxed and waned, occasionally healing, but always come back.  For the past few weeks, she has developed wound on her other buttock and a tiny wound near her sacrum.  This is occasionally painful, there is not much discharge.  PAST MEDICAL HISTORY:  Significant for hypertension, type 2 diabetes, history of pulmonary embolism, atrial fibrillation, anemia, some senile dementia, and low back pain.  PAST SURGICAL HISTORY:  Hysterectomy, cholecystectomy, bladder surgery, and knee surgery.  Cigarettes none.  Alcohol none.  MEDICATIONS:  Glimepiride, alprazolam, iron, and vitamin B12, Janumet, tramadol, __________ Zyrtec, pravastatin, diltiazem, Diprolene, mirtazapine, Klor-Con, losartan, furosemide, and Coumadin.  ALLERGIES:  DETROL, CEPHALEXIN, VIOXX, LATEX, and HUMIBID.  REVIEW OF SYSTEMS:  Essentially as above.  PHYSICAL EXAMINATION:  VITAL SIGNS:  Temperature is 97.6, pulse 93 and irregular, respirations 18, blood pressure 136/74, blood glucose is 536. Please note that we have asked the patient's daughter to check her sugar after 2 hours since this is a nonfasting specimen, and call her family doctor if this remains elevated. GENERAL APPEARANCE:  Well developed, somewhat pale. CHEST:  Clear. HEART:  Heart is in regular rhythm. EXTREMITIES:  Not examined.  On the left buttock, there is a 0.4 x 0.3 superficial wound.  The right buttock is 1.6 x  0.5, very superficial wound and near the sacrum, 0.9 x 0.3 x 0.1.  These wounds are basically superficial abrasions surrounded by some purple tissue.  There is no fluctuance.  No particular tenderness.  IMPRESSION:  Multiple decubitus ulcers.  PLAN OF TREATMENT:  We will start treatment with DuoDerm.  We will try to get the patient ambulating more.  We will seek to get a special mattress and a cushion for her chair.  We will see her in 7 days.     Joanne Gavel, M.D.     RA/MEDQ  D:  02/22/2013  T:  02/23/2013  Job:  [id number]

## 2013-02-28 ENCOUNTER — Emergency Department (HOSPITAL_COMMUNITY): Payer: Medicare Other

## 2013-02-28 ENCOUNTER — Inpatient Hospital Stay (HOSPITAL_COMMUNITY)
Admission: EM | Admit: 2013-02-28 | Discharge: 2013-03-03 | DRG: 638 | Disposition: A | Discharge status: Home Health | Payer: Medicare Other | Attending: Internal Medicine | Admitting: Internal Medicine

## 2013-02-28 ENCOUNTER — Encounter (HOSPITAL_COMMUNITY): Payer: Self-pay | Admitting: Emergency Medicine

## 2013-02-28 DIAGNOSIS — R339 Retention of urine, unspecified: Secondary | ICD-10-CM

## 2013-02-28 DIAGNOSIS — I2510 Atherosclerotic heart disease of native coronary artery without angina pectoris: Secondary | ICD-10-CM | POA: Diagnosis present

## 2013-02-28 DIAGNOSIS — N183 Chronic kidney disease, stage 3 unspecified: Secondary | ICD-10-CM | POA: Diagnosis present

## 2013-02-28 DIAGNOSIS — Z7901 Long term (current) use of anticoagulants: Secondary | ICD-10-CM

## 2013-02-28 DIAGNOSIS — E1101 Type 2 diabetes mellitus with hyperosmolarity with coma: Secondary | ICD-10-CM | POA: Diagnosis present

## 2013-02-28 DIAGNOSIS — N179 Acute kidney failure, unspecified: Secondary | ICD-10-CM | POA: Diagnosis present

## 2013-02-28 DIAGNOSIS — I129 Hypertensive chronic kidney disease with stage 1 through stage 4 chronic kidney disease, or unspecified chronic kidney disease: Secondary | ICD-10-CM | POA: Diagnosis present

## 2013-02-28 DIAGNOSIS — E1110 Type 2 diabetes mellitus with ketoacidosis without coma: Secondary | ICD-10-CM | POA: Insufficient documentation

## 2013-02-28 DIAGNOSIS — K219 Gastro-esophageal reflux disease without esophagitis: Secondary | ICD-10-CM | POA: Diagnosis present

## 2013-02-28 DIAGNOSIS — R600 Localized edema: Secondary | ICD-10-CM | POA: Diagnosis present

## 2013-02-28 DIAGNOSIS — F17200 Nicotine dependence, unspecified, uncomplicated: Secondary | ICD-10-CM | POA: Diagnosis present

## 2013-02-28 DIAGNOSIS — Z86718 Personal history of other venous thrombosis and embolism: Secondary | ICD-10-CM

## 2013-02-28 DIAGNOSIS — IMO0002 Reserved for concepts with insufficient information to code with codable children: Secondary | ICD-10-CM | POA: Diagnosis present

## 2013-02-28 DIAGNOSIS — I4891 Unspecified atrial fibrillation: Secondary | ICD-10-CM | POA: Diagnosis present

## 2013-02-28 DIAGNOSIS — Z86711 Personal history of pulmonary embolism: Secondary | ICD-10-CM | POA: Diagnosis present

## 2013-02-28 DIAGNOSIS — I872 Venous insufficiency (chronic) (peripheral): Secondary | ICD-10-CM | POA: Diagnosis present

## 2013-02-28 DIAGNOSIS — E119 Type 2 diabetes mellitus without complications: Secondary | ICD-10-CM

## 2013-02-28 DIAGNOSIS — E1165 Type 2 diabetes mellitus with hyperglycemia: Secondary | ICD-10-CM

## 2013-02-28 DIAGNOSIS — E669 Obesity, unspecified: Secondary | ICD-10-CM

## 2013-02-28 DIAGNOSIS — Z79899 Other long term (current) drug therapy: Secondary | ICD-10-CM

## 2013-02-28 DIAGNOSIS — E785 Hyperlipidemia, unspecified: Secondary | ICD-10-CM | POA: Diagnosis present

## 2013-02-28 DIAGNOSIS — E1100 Type 2 diabetes mellitus with hyperosmolarity without nonketotic hyperglycemic-hyperosmolar coma (NKHHC): Principal | ICD-10-CM | POA: Diagnosis present

## 2013-02-28 LAB — PRO B NATRIURETIC PEPTIDE: Pro B Natriuretic peptide (BNP): 478.4 pg/mL — ABNORMAL HIGH (ref 0–450)

## 2013-02-28 LAB — POCT I-STAT 3, VENOUS BLOOD GAS (G3P V)
Acid-base deficit: 5 mmol/L — ABNORMAL HIGH (ref 0.0–2.0)
Bicarbonate: 22 mEq/L (ref 20.0–24.0)
O2 Saturation: 69 %
TCO2: 23 mmol/L (ref 0–100)
pO2, Ven: 41 mmHg (ref 30.0–45.0)

## 2013-02-28 LAB — CBC
HCT: 31 % — ABNORMAL LOW (ref 36.0–46.0)
MCV: 80.5 fL (ref 78.0–100.0)
RBC: 3.85 MIL/uL — ABNORMAL LOW (ref 3.87–5.11)
WBC: 10.4 10*3/uL (ref 4.0–10.5)

## 2013-02-28 LAB — URINALYSIS, ROUTINE W REFLEX MICROSCOPIC
Protein, ur: NEGATIVE mg/dL
Urobilinogen, UA: 0.2 mg/dL (ref 0.0–1.0)

## 2013-02-28 LAB — COMPREHENSIVE METABOLIC PANEL
BUN: 31 mg/dL — ABNORMAL HIGH (ref 6–23)
CO2: 22 mEq/L (ref 19–32)
Chloride: 92 mEq/L — ABNORMAL LOW (ref 96–112)
Creatinine, Ser: 1.68 mg/dL — ABNORMAL HIGH (ref 0.50–1.10)
GFR calc non Af Amer: 26 mL/min — ABNORMAL LOW (ref >90)
Total Bilirubin: 0.2 mg/dL — ABNORMAL LOW (ref 0.3–1.2)

## 2013-02-28 LAB — URINE MICROSCOPIC-ADD ON

## 2013-02-28 LAB — PROTIME-INR: Prothrombin Time: 25.1 seconds — ABNORMAL HIGH (ref 11.6–15.2)

## 2013-02-28 LAB — TROPONIN I: Troponin I: <0.3 ng/mL (ref <0.30)

## 2013-02-28 MED ORDER — LIDOCAINE HCL 2 % EX GEL
Freq: Once | CUTANEOUS | Status: AC
  Administered 2013-02-28: 20 via TOPICAL
  Filled 2013-02-28: qty 20

## 2013-02-28 MED ORDER — SODIUM CHLORIDE 0.9 % IV SOLN
1000.0000 mL | INTRAVENOUS | Status: DC
  Administered 2013-02-28: 1000 mL via INTRAVENOUS

## 2013-02-28 MED ORDER — SODIUM CHLORIDE 0.9 % IV SOLN
INTRAVENOUS | Status: DC
  Administered 2013-02-28: 4.6 [IU]/h via INTRAVENOUS
  Administered 2013-02-28: 5.4 [IU]/h via INTRAVENOUS
  Filled 2013-02-28: qty 1

## 2013-02-28 MED ORDER — SODIUM CHLORIDE 0.9 % IV BOLUS (SEPSIS)
500.0000 mL | Freq: Once | INTRAVENOUS | Status: AC
  Administered 2013-02-28: 500 mL via INTRAVENOUS

## 2013-02-28 NOTE — ED Notes (Signed)
Daughter was called by the MD and was told to bring her to the ED due to her sugar being 758 (had blood drawn today)

## 2013-02-28 NOTE — ED Notes (Signed)
Pt blood sugar is elevated this evening. Pt takes 2 pills for blood sugar- non insulin dependent.

## 2013-02-28 NOTE — ED Notes (Signed)
Dr. Romeo Apple to change Pt superpubic catheter. Changed from 30f 6.82mm to 105f 6.53mm. Pt tolerated well.

## 2013-02-28 NOTE — ED Notes (Signed)
Pts catheter has been clamped.  Will attempt to draw urine off same.

## 2013-02-28 NOTE — ED Provider Notes (Signed)
CSN: [id number]     Arrival date & time 02/28/13  1850 History   First MD Initiated Contact with Patient 02/28/13 1949     Chief Complaint  Patient presents with  . Hyperglycemia   (Consider location/radiation/quality/duration/timing/severity/associated sxs/prior Treatment) Patient is a 79 y.o. female presenting with hyperglycemia. The history is provided by the patient.  Hyperglycemia Severity:  Moderate Onset quality:  Gradual Duration: unsure. Timing:  Constant Progression:  Unchanged Chronicity:  New Diabetes status:  Controlled with oral medications Relieved by:  Nothing Ineffective treatments:  None tried Associated symptoms: no abdominal pain, no chest pain, no dizziness, no dysuria, no fatigue, no fever, no nausea, no shortness of breath and no vomiting     Past Medical History  Diagnosis Date  . Hypertension   . GERD (gastroesophageal reflux disease)   . Diabetes mellitus   . History of atrial fibrillation EPISODE YRS AGO  . Urinary retention     s/p suprapubic catheter November 2013  . Coronary artery disease cardiologist- dr Maylon Cos--  lov 09-12-2010 in epic    non-obstructinve min. cad  . Foley catheter in place   . Edema of lower extremity BILATERAL LEG  . Hyperlipidemia   . History of pulmonary embolus (PE) 2010--  TAKES COUMADIN  . History of DVT of lower extremity   . Anticoagulated on Coumadin   . Generalized weakness AGE RELATED--  USES WALKER  . Vertigo   . OA (osteoarthritis)   . Thin skin FRAGILE  . Hypothyroidism NO MEDS  . Impaired hearing BILATERAL AIDS  . Chronic diarrhea INTERMITTANT   Past Surgical History  Procedure Laterality Date  . Knee surgery    . Tubal ligation    . Abdominal hysterectomy    . Cardiac catheterization  09-27-2004  DR TENNENT    NORMAL LVF/ MILD IRREGULARITIES IN MID LAD W/ NO SIGNIFICANT OBSTRUCTIVE DISEASE  . Abdominal hysterectomy  1994  (APPROX)    W/ BILATERAL SALPINGO-OOPHORECTOMY  . Cholecystectomy  2000   . Knee arthroscopy  1998  (APPROX)  . Bladder suspension  2002  (APPROX)    W/ ANTERIOR AND POSTERIOR REPAIR  . Cataract extraction w/ intraocular lens  implant, bilateral    . Transthoracic echocardiogram  02-23-2009  (DX W/ PE & PAF)    EF 65-70%/ MILD MR/ MODERATE TR/ MILDLY DILATED RA  &  LA/  RV SYSTOLIC FUNCTION MODERATLY REDUCED   . Insertion of suprapubic catheter  04/02/2012    Procedure: INSERTION OF SUPRAPUBIC CATHETER;  Surgeon: Garnett Farm, MD;  Location: [facility];  Service: Urology;  Laterality: N/A;  SUPRAPUBIC TUBE PLACEMENT  . Cystoscopy  04/02/2012    Procedure: CYSTOSCOPY;  Surgeon: Garnett Farm, MD;  Location: [facility];  Service: Urology;;   Family History  Problem Relation Age of Onset  . Heart disease Mother   . Hypertension Mother    History  Substance Use Topics  . Smoking status: Never Smoker   . Smokeless tobacco: Current User    Types: Snuff     Comment: DIPPED TOBACCO SINCE AGE 63  . Alcohol Use: No   OB History   Grav Para Term Preterm Abortions TAB SAB Ect Mult Living                 Review of Systems  Constitutional: Negative for fever and fatigue.  HENT: Negative for congestion and drooling.   Eyes: Negative for pain.  Respiratory: Negative for cough and shortness of  breath.   Cardiovascular: Negative for chest pain.  Gastrointestinal: Negative for nausea, vomiting, abdominal pain and diarrhea.  Genitourinary: Negative for dysuria and hematuria.  Musculoskeletal: Negative for back pain, gait problem and neck pain.  Skin: Negative for color change.  Neurological: Positive for weakness (generalized). Negative for dizziness and headaches.  Hematological: Negative for adenopathy.  Psychiatric/Behavioral: Negative for behavioral problems.  All other systems reviewed and are negative.    Allergies  Latex; Detrol; Humibid la; and Vioxx  Home Medications   Current Outpatient Rx  Name  Route  Sig   Dispense  Refill  . ALPRAZolam (XANAX) 0.25 MG tablet   Oral   Take 0.125-0.25 mg by mouth 2 (two) times daily as needed for sleep or anxiety (0.5-1 tablet).          . cephALEXin (KEFLEX) 500 MG capsule   Oral   Take 1 capsule (500 mg total) by mouth 2 (two) times daily.   14 capsule   0   . cetirizine (ZYRTEC) 10 MG chewable tablet   Oral   Chew 10 mg by mouth daily.         Marland Kitchen diltiazem (CARDIZEM) 120 MG tablet   Oral   Take 120 mg by mouth 2 (two) times daily.         . ferrous sulfate 325 (65 FE) MG tablet   Oral   Take 325 mg by mouth at bedtime.          Marland Kitchen Fesoterodine Fumarate (TOVIAZ PO)   Oral   Take 8 mg by mouth 2 (two) times daily.         . Fluticasone-Salmeterol (ADVAIR) 250-50 MCG/DOSE AEPB   Inhalation   Inhale 1 puff into the lungs every 12 (twelve) hours.         . furosemide (LASIX) 40 MG tablet   Oral   Take 60 mg by mouth daily. Take 1.5 tablets   1 tablet      . glimepiride (AMARYL) 4 MG tablet   Oral   Take 4 mg by mouth daily.          Marland Kitchen losartan (COZAAR) 100 MG tablet   Oral   Take 100 mg by mouth daily.         . metoCLOPramide (REGLAN) 5 MG tablet   Oral   Take 2.5 mg by mouth 4 (four) times daily. 1/2 tablet 4 times daily         . mirtazapine (REMERON) 15 MG tablet   Oral   Take 15 mg by mouth at bedtime.         . pravastatin (PRAVACHOL) 20 MG tablet   Oral   Take 20 mg by mouth daily.         . sitaGLIPtin (JANUVIA) 50 MG tablet   Oral   Take 50 mg by mouth daily.         . traMADol (ULTRAM) 50 MG tablet   Oral   Take 50 mg by mouth every 6 (six) hours as needed. Pain         . vitamin B-12 (CYANOCOBALAMIN) 1000 MCG tablet   Oral   Take 1,000 mcg by mouth daily.         Marland Kitchen warfarin (COUMADIN) 2 MG tablet   Oral   Take 2 mg by mouth See admin instructions. Tuesday and Friday takes 1.5 (3mg ) tablet. All other days takes 1 tablet.          BP 150/51  Temp(Src) 97.7 F (36.5 C) (Oral)   Resp 20  Ht 5\' 5"  (1.651 m)  Wt 199 lb 11.2 oz (90.583 kg)  BMI 33.23 kg/m2  SpO2 96% Physical Exam  Nursing note and vitals reviewed. Constitutional: She is oriented to person, place, and time. She appears well-developed and well-nourished.  HENT:  Head: Normocephalic.  Mouth/Throat: Oropharynx is clear and moist. No oropharyngeal exudate.  Eyes: Conjunctivae and EOM are normal. Pupils are equal, round, and reactive to light.  Neck: Normal range of motion. Neck supple.  Cardiovascular: Normal rate, regular rhythm, normal heart sounds and intact distal pulses.  Exam reveals no gallop and no friction rub.   No murmur heard. Pulmonary/Chest: Effort normal and breath sounds normal. No respiratory distress. She has no wheezes.  Abdominal: Soft. Bowel sounds are normal. There is no tenderness. There is no rebound and no guarding.  Small amount of blood at meatus to suprapubic catheter.   Musculoskeletal: Normal range of motion. She exhibits edema (mild to mod bilateral LE edema that extends to thighs). She exhibits no tenderness.  Neurological: She is alert and oriented to person, place, and time.  Skin: Skin is warm and dry.  Small wound to right lower anterior shin. Hemostatic. Mild erythema.   Psychiatric: She has a normal mood and affect. Her behavior is normal.    ED Course  Procedures (including critical care time) Labs Review Labs Reviewed  GLUCOSE, CAPILLARY - Abnormal; Notable for the following:    Glucose-Capillary >600 (*)    All other components within normal limits  CBC - Abnormal; Notable for the following:    RBC 3.85 (*)    Hemoglobin 10.9 (*)    HCT 31.0 (*)    All other components within normal limits  COMPREHENSIVE METABOLIC PANEL - Abnormal; Notable for the following:    Sodium 128 (*)    Chloride 92 (*)    BUN 31 (*)    Creatinine, Ser 1.68 (*)    Calcium 8.2 (*)    Albumin 3.2 (*)    Alkaline Phosphatase 171 (*)    Total Bilirubin 0.2 (*)    GFR calc  non Af Amer 26 (*)    GFR calc Af Amer 31 (*)    All other components within normal limits  URINALYSIS, ROUTINE W REFLEX MICROSCOPIC  TROPONIN I  PRO B NATRIURETIC PEPTIDE  URINALYSIS W MICROSCOPIC + REFLEX CULTURE  BLOOD GAS, VENOUS   Imaging Review Dg Chest 2 View  02/28/2013   CLINICAL DATA:  Chest pain with shortness of breath.  EXAM: CHEST  2 VIEW  COMPARISON:  [facility] radiograph 07/06/2012.  FINDINGS: Low lung volumes. Cardiomegaly. Calcified tortuous aorta. Left lower chest lipoma appears stable. No infiltrates or failure. Osseous demineralization. No pneumothorax. Similar appearance to priors except for low lung volumes.  IMPRESSION: No active cardiopulmonary disease.   Electronically Signed   By: Davonna Belling M.D.   On: 02/28/2013 22:28    Date: 03/01/2013  Rate: 74  Rhythm: normal sinus rhythm  QRS Axis: normal  Intervals: normal  ST/T Wave abnormalities: normal  Conduction Disutrbances:none  Narrative Interpretation: No ST or T wave changes consistent with ischemia.    Old EKG Reviewed: unchanged    MDM   1. DKA (diabetic ketoacidoses)   2. ARF (acute renal failure)   3. Diabetes mellitus type 2, uncontrolled   4. Lower extremity edema    8:17 PM 79 y.o. female w hx of DVT on coumadin, CHF, DM  who presents with elevated blood sugar which was found today. The family states that her PCP called and informed them that her blood sugar was in the 700s. The patient states that she has generalized weakness but denies any nausea, vomiting, diarrhea, chest pain, or shortness of breath. She is afebrile and vital signs are unremarkable here. She has a chronic indwelling suprapubic catheter. Will screen for infection and start insulin drip in the meantime. Will get a 500 cc bolus and start maintenance fluids after that. Will start fluids conservatively at this time due to the history of heart failure.  Suprapubic foley changed by me. 53fr, 6.40mm, silicone Rusch changed  to a 51fr, 6.7 mm, silicone Rusch. This was changed to get a clean urine sample to r/o UTI. Family notes that the catheter was supposed to be changed the next day regardless.   Will admit to hospitalist.   Junius Argyle, MD 03/01/13 1113

## 2013-03-01 ENCOUNTER — Encounter (HOSPITAL_COMMUNITY): Payer: Self-pay | Admitting: Internal Medicine

## 2013-03-01 DIAGNOSIS — E1165 Type 2 diabetes mellitus with hyperglycemia: Secondary | ICD-10-CM | POA: Diagnosis present

## 2013-03-01 DIAGNOSIS — IMO0001 Reserved for inherently not codable concepts without codable children: Secondary | ICD-10-CM

## 2013-03-01 DIAGNOSIS — R609 Edema, unspecified: Secondary | ICD-10-CM

## 2013-03-01 DIAGNOSIS — Z86711 Personal history of pulmonary embolism: Secondary | ICD-10-CM | POA: Diagnosis present

## 2013-03-01 DIAGNOSIS — N179 Acute kidney failure, unspecified: Secondary | ICD-10-CM

## 2013-03-01 DIAGNOSIS — E1110 Type 2 diabetes mellitus with ketoacidosis without coma: Secondary | ICD-10-CM

## 2013-03-01 DIAGNOSIS — I4891 Unspecified atrial fibrillation: Secondary | ICD-10-CM | POA: Diagnosis present

## 2013-03-01 DIAGNOSIS — R600 Localized edema: Secondary | ICD-10-CM | POA: Diagnosis present

## 2013-03-01 LAB — BASIC METABOLIC PANEL
BUN: 28 mg/dL — ABNORMAL HIGH (ref 6–23)
Calcium: 8.2 mg/dL — ABNORMAL LOW (ref 8.4–10.5)
Creatinine, Ser: 1.48 mg/dL — ABNORMAL HIGH (ref 0.50–1.10)
GFR calc Af Amer: 36 mL/min — ABNORMAL LOW (ref >90)
GFR calc non Af Amer: 31 mL/min — ABNORMAL LOW (ref >90)
Glucose, Bld: 282 mg/dL — ABNORMAL HIGH (ref 70–99)
Potassium: 3.9 mEq/L (ref 3.5–5.1)

## 2013-03-01 LAB — GLUCOSE, CAPILLARY
Glucose-Capillary: 215 mg/dL — ABNORMAL HIGH (ref 70–99)
Glucose-Capillary: 274 mg/dL — ABNORMAL HIGH (ref 70–99)
Glucose-Capillary: 249 mg/dL — ABNORMAL HIGH (ref 70–99)
Glucose-Capillary: 323 mg/dL — ABNORMAL HIGH (ref 70–99)
Glucose-Capillary: 366 mg/dL — ABNORMAL HIGH (ref 70–99)
Glucose-Capillary: 442 mg/dL — ABNORMAL HIGH (ref 70–99)

## 2013-03-01 LAB — BASIC METABOLIC PANEL WITH GFR
BUN: 26 mg/dL — ABNORMAL HIGH (ref 6–23)
CO2: 23 meq/L (ref 19–32)
Calcium: 8.1 mg/dL — ABNORMAL LOW (ref 8.4–10.5)
Chloride: 101 meq/L (ref 96–112)
Creatinine, Ser: 1.47 mg/dL — ABNORMAL HIGH (ref 0.50–1.10)
GFR calc Af Amer: 36 mL/min — ABNORMAL LOW
GFR calc non Af Amer: 31 mL/min — ABNORMAL LOW
Glucose, Bld: 261 mg/dL — ABNORMAL HIGH (ref 70–99)
Potassium: 3.6 meq/L (ref 3.5–5.1)
Sodium: 134 meq/L — ABNORMAL LOW (ref 135–145)

## 2013-03-01 LAB — CBC
HCT: 27.6 % — ABNORMAL LOW (ref 36.0–46.0)
MCH: 27.2 pg (ref 26.0–34.0)
MCV: 79.1 fL (ref 78.0–100.0)
Platelets: 236 10*3/uL (ref 150–400)
RBC: 3.49 MIL/uL — ABNORMAL LOW (ref 3.87–5.11)
RDW: 13.8 % (ref 11.5–15.5)

## 2013-03-01 LAB — PROTIME-INR
INR: 2.68 — ABNORMAL HIGH (ref 0.00–1.49)
Prothrombin Time: 27.6 seconds — ABNORMAL HIGH (ref 11.6–15.2)

## 2013-03-01 MED ORDER — INSULIN ASPART 100 UNIT/ML ~~LOC~~ SOLN
0.0000 [IU] | Freq: Three times a day (TID) | SUBCUTANEOUS | Status: DC
  Administered 2013-03-01: 9 [IU] via SUBCUTANEOUS
  Administered 2013-03-01: 3 [IU] via SUBCUTANEOUS
  Administered 2013-03-01: 12:00:00 7 [IU] via SUBCUTANEOUS
  Administered 2013-03-02 (×3): 5 [IU] via SUBCUTANEOUS
  Administered 2013-03-03: 7 [IU] via SUBCUTANEOUS
  Administered 2013-03-03: 3 [IU] via SUBCUTANEOUS

## 2013-03-01 MED ORDER — VITAMIN B-12 1000 MCG PO TABS
1000.0000 ug | ORAL_TABLET | Freq: Every day | ORAL | Status: DC
  Administered 2013-03-01 – 2013-03-03 (×3): 1000 ug via ORAL
  Filled 2013-03-01 (×3): qty 1

## 2013-03-01 MED ORDER — LORATADINE 10 MG PO TABS
10.0000 mg | ORAL_TABLET | Freq: Every day | ORAL | Status: DC
  Administered 2013-03-01 – 2013-03-03 (×3): 10 mg via ORAL
  Filled 2013-03-01 (×3): qty 1

## 2013-03-01 MED ORDER — METOCLOPRAMIDE HCL 5 MG PO TABS
2.5000 mg | ORAL_TABLET | Freq: Four times a day (QID) | ORAL | Status: DC
Start: 2013-03-01 — End: 2013-03-03
  Administered 2013-03-01 – 2013-03-03 (×10): 2.5 mg via ORAL
  Filled 2013-03-01 (×12): qty 0.5

## 2013-03-01 MED ORDER — VANCOMYCIN HCL IN DEXTROSE 750-5 MG/150ML-% IV SOLN
750.0000 mg | Freq: Once | INTRAVENOUS | Status: AC
  Administered 2013-03-01: 02:00:00 750 mg via INTRAVENOUS
  Filled 2013-03-01: qty 150

## 2013-03-01 MED ORDER — INSULIN ASPART 100 UNIT/ML ~~LOC~~ SOLN
10.0000 [IU] | Freq: Once | SUBCUTANEOUS | Status: AC
  Administered 2013-03-01: 23:00:00 10 [IU] via SUBCUTANEOUS

## 2013-03-01 MED ORDER — LIDOCAINE 5 % EX PTCH
1.0000 | MEDICATED_PATCH | Freq: Every day | CUTANEOUS | Status: DC | PRN
  Filled 2013-03-01: qty 1

## 2013-03-01 MED ORDER — INSULIN REGULAR BOLUS VIA INFUSION
0.0000 [IU] | Freq: Three times a day (TID) | INTRAVENOUS | Status: DC
  Filled 2013-03-01: qty 10

## 2013-03-01 MED ORDER — GLIMEPIRIDE 4 MG PO TABS
4.0000 mg | ORAL_TABLET | Freq: Every day | ORAL | Status: DC
  Administered 2013-03-01: 10:00:00 4 mg via ORAL
  Filled 2013-03-01 (×2): qty 1

## 2013-03-01 MED ORDER — ONDANSETRON HCL 4 MG PO TABS: 4.0000 mg | ORAL_TABLET | Freq: Four times a day (QID) | ORAL | Status: DC | PRN

## 2013-03-01 MED ORDER — INSULIN GLARGINE 100 UNIT/ML ~~LOC~~ SOLN
5.0000 [IU] | Freq: Every day | SUBCUTANEOUS | Status: DC
  Administered 2013-03-01: 03:00:00 5 [IU] via SUBCUTANEOUS
  Filled 2013-03-01: qty 0.05

## 2013-03-01 MED ORDER — SODIUM CHLORIDE 0.9 % IJ SOLN
3.0000 mL | Freq: Two times a day (BID) | INTRAMUSCULAR | Status: DC
  Administered 2013-03-02 – 2013-03-03 (×3): 3 mL via INTRAVENOUS

## 2013-03-01 MED ORDER — SODIUM CHLORIDE 0.9 % IV SOLN
INTRAVENOUS | Status: DC
  Administered 2013-03-01: 02:00:00 3.1 [IU]/h via INTRAVENOUS
  Filled 2013-03-01 (×2): qty 1

## 2013-03-01 MED ORDER — DEXTROSE 50 % IV SOLN: 25.0000 mL | INTRAVENOUS | Status: DC | PRN

## 2013-03-01 MED ORDER — INSULIN GLARGINE 100 UNIT/ML ~~LOC~~ SOLN
10.0000 [IU] | Freq: Every day | SUBCUTANEOUS | Status: DC
  Administered 2013-03-01: 23:00:00 10 [IU] via SUBCUTANEOUS
  Filled 2013-03-01 (×2): qty 0.1

## 2013-03-01 MED ORDER — TRAMADOL HCL 50 MG PO TABS
50.0000 mg | ORAL_TABLET | Freq: Four times a day (QID) | ORAL | Status: DC | PRN
  Administered 2013-03-03: 50 mg via ORAL
  Filled 2013-03-01: qty 1

## 2013-03-01 MED ORDER — ACETAMINOPHEN 650 MG RE SUPP: 650.0000 mg | Freq: Four times a day (QID) | RECTAL | Status: DC | PRN

## 2013-03-01 MED ORDER — ACETAMINOPHEN 325 MG PO TABS
650.0000 mg | ORAL_TABLET | Freq: Four times a day (QID) | ORAL | Status: DC | PRN
  Administered 2013-03-03: 650 mg via ORAL
  Filled 2013-03-01: qty 2

## 2013-03-01 MED ORDER — LINAGLIPTIN 5 MG PO TABS
5.0000 mg | ORAL_TABLET | Freq: Every day | ORAL | Status: DC
  Administered 2013-03-01 – 2013-03-03 (×3): 5 mg via ORAL
  Filled 2013-03-01 (×3): qty 1

## 2013-03-01 MED ORDER — SIMVASTATIN 5 MG PO TABS
5.0000 mg | ORAL_TABLET | Freq: Every day | ORAL | Status: DC
  Administered 2013-03-01 – 2013-03-02 (×2): 5 mg via ORAL
  Filled 2013-03-01 (×3): qty 1

## 2013-03-01 MED ORDER — FERROUS SULFATE 325 (65 FE) MG PO TABS
325.0000 mg | ORAL_TABLET | Freq: Every day | ORAL | Status: DC
  Administered 2013-03-01 – 2013-03-02 (×2): 325 mg via ORAL
  Filled 2013-03-01 (×3): qty 1

## 2013-03-01 MED ORDER — PANTOPRAZOLE SODIUM 40 MG PO TBEC
40.0000 mg | DELAYED_RELEASE_TABLET | Freq: Every day | ORAL | Status: DC
  Administered 2013-03-01 – 2013-03-03 (×3): 40 mg via ORAL
  Filled 2013-03-01 (×3): qty 1

## 2013-03-01 MED ORDER — SODIUM CHLORIDE 0.9 % IV SOLN
INTRAVENOUS | Status: AC
  Administered 2013-03-01: 01:00:00 via INTRAVENOUS

## 2013-03-01 MED ORDER — ONDANSETRON HCL 4 MG/2ML IJ SOLN: 4.0000 mg | Freq: Four times a day (QID) | INTRAMUSCULAR | Status: DC | PRN

## 2013-03-01 MED ORDER — MIRTAZAPINE 15 MG PO TABS
15.0000 mg | ORAL_TABLET | Freq: Every day | ORAL | Status: DC
  Administered 2013-03-01 – 2013-03-02 (×2): 15 mg via ORAL
  Filled 2013-03-01 (×4): qty 1

## 2013-03-01 MED ORDER — LOSARTAN POTASSIUM 50 MG PO TABS
100.0000 mg | ORAL_TABLET | Freq: Every day | ORAL | Status: DC
  Administered 2013-03-01 – 2013-03-03 (×3): 100 mg via ORAL
  Filled 2013-03-01 (×3): qty 2

## 2013-03-01 MED ORDER — WARFARIN - PHARMACIST DOSING INPATIENT: Freq: Every day | Status: DC

## 2013-03-01 MED ORDER — DILTIAZEM HCL 60 MG PO TABS
120.0000 mg | ORAL_TABLET | Freq: Two times a day (BID) | ORAL | Status: DC
  Administered 2013-03-01 – 2013-03-03 (×6): 120 mg via ORAL
  Filled 2013-03-01 (×7): qty 2

## 2013-03-01 MED ORDER — DOCUSATE SODIUM 100 MG PO CAPS
100.0000 mg | ORAL_CAPSULE | Freq: Two times a day (BID) | ORAL | Status: DC | PRN
  Filled 2013-03-01: qty 1

## 2013-03-01 MED ORDER — VANCOMYCIN HCL IN DEXTROSE 750-5 MG/150ML-% IV SOLN
750.0000 mg | INTRAVENOUS | Status: DC
  Administered 2013-03-01: 750 mg via INTRAVENOUS
  Filled 2013-03-01 (×2): qty 150

## 2013-03-01 MED ORDER — OXYBUTYNIN CHLORIDE ER 5 MG PO TB24
5.0000 mg | ORAL_TABLET | Freq: Every day | ORAL | Status: DC
  Administered 2013-03-01 – 2013-03-03 (×3): 5 mg via ORAL
  Filled 2013-03-01 (×3): qty 1

## 2013-03-01 MED ORDER — MOMETASONE FURO-FORMOTEROL FUM 100-5 MCG/ACT IN AERO
2.0000 | INHALATION_SPRAY | Freq: Two times a day (BID) | RESPIRATORY_TRACT | Status: DC
  Administered 2013-03-01 – 2013-03-03 (×4): 2 via RESPIRATORY_TRACT
  Filled 2013-03-01: qty 8.8

## 2013-03-01 MED ORDER — ALPRAZOLAM 0.25 MG PO TABS: 0.1250 mg | ORAL_TABLET | Freq: Two times a day (BID) | ORAL | Status: DC | PRN

## 2013-03-01 MED ORDER — SODIUM CHLORIDE 0.9 % IV SOLN
INTRAVENOUS | Status: AC
  Administered 2013-03-01: 15:00:00 75 mL/h via INTRAVENOUS

## 2013-03-01 MED ORDER — WARFARIN SODIUM 5 MG PO TABS
5.0000 mg | ORAL_TABLET | Freq: Every day | ORAL | Status: DC
  Administered 2013-03-01 (×2): 5 mg via ORAL
  Filled 2013-03-01 (×3): qty 1

## 2013-03-01 NOTE — Evaluation (Addendum)
Physical Therapy Evaluation Patient Details Name: Krystal [name] MRN: [id number] DOB: [birth year] Today's Date: 03/01/2013 Time: 4098-1191 PT Time Calculation (min): 28 min  PT Assessment / Plan / Recommendation History of Present Illness  Krystal [name] is a 79 y.o. female with known history of diabetes mellitus type 2, atrial fibrillation and PE on Coumadin, chronic lower extremity edema, hypertension had gone for routine follow with her PCP yesterday for checking INR went patient collection was found to be more than 700 and was referred to the ER  Clinical Impression  Pt is a pleasant lady who is HOH and determined to return to apt. Dgtr present and states they may be able to arrange family coming by more frequently and more hours from CNA but 24hr care not available. Pt with below deficits including cognition and weakness and will benefit from acute therapy to maximize mobility and balance as well as further ST-SNF therapy prior to return home. Dgtr states pt doesn't generally wake til 10 or 11am and is generally confused and groggy as she was throughout today's eval during that time and requests therapy visit later in the day for next visits. Do not feel pt is safe to be living alone at this time and dgtr aware and agreeable to ST-SNF but pt did not seem agreeable but was accepting of increased assist and HHPT should she refuse SNF. Will follow. Pt educated for pressure relief and need to sleep in bed to reduce pressure on sacrum.    PT Assessment  Patient needs continued PT services    Follow Up Recommendations  SNF;Supervision for mobility/OOB    Does the patient have the potential to tolerate intense rehabilitation      Barriers to Discharge Decreased caregiver support      Equipment Recommendations   (hospital bed)    Recommendations for Other Services OT consult   Frequency Min 3X/week    Precautions / Restrictions Precautions Precautions: Fall Restrictions Weight Bearing  Restrictions: No   Pertinent Vitals/Pain No pain HR 68-73      Mobility  Bed Mobility Bed Mobility: Rolling Right;Right Sidelying to Sit;Sitting - Scoot to Edge of Bed Rolling Right: 4: Min assist Right Sidelying to Sit: 3: Mod assist;HOB elevated;With rails Sitting - Scoot to Edge of Bed: 3: Mod assist Details for Bed Mobility Assistance: pt required cueing and assist to reach for rail, elevate trunk and scoot to EOB with use of pad Transfers Transfers: Sit to Stand;Stand to Sit Sit to Stand: 4: Min assist;From bed Stand to Sit: 4: Min assist;To chair/3-in-1;With armrests Details for Transfer Assistance: cueing for hand placement and safety Ambulation/Gait Ambulation/Gait Assistance: 4: Min guard Ambulation Distance (Feet): 20 Feet Assistive device: Rolling walker Ambulation/Gait Assistance Details: cueing for posture, position in Rw and safety Gait Pattern: Trunk flexed;Shuffle Gait velocity: decreased Stairs: No    Exercises     PT Diagnosis: Difficulty walking;Generalized weakness  PT Problem List: Decreased strength;Decreased cognition;Decreased activity tolerance;Decreased mobility;Decreased balance;Decreased knowledge of use of DME;Obesity PT Treatment Interventions: Gait training;Functional mobility training;Therapeutic activities;Therapeutic exercise;Patient/family education;DME instruction     PT Goals(Current goals can be found in the care plan section) Acute Rehab PT Goals Patient Stated Goal: return to her apt PT Goal Formulation: With patient/family Time For Goal Achievement: 03/15/13 Potential to Achieve Goals: Fair  Visit Information  Last PT Received On: 03/01/13 Assistance Needed: +1 History of Present Illness: Krystal [name] is a 79 y.o. female with known history of diabetes mellitus type 2, atrial fibrillation  and PE on Coumadin, chronic lower extremity edema, hypertension had gone for routine follow with her PCP yesterday for checking INR went patient  collection was found to be more than 700 and was referred to the ER       Prior Functioning  Home Living Family/patient expects to be discharged to:: Private residence Living Arrangements: Alone Available Help at Discharge: Family;Personal care attendant Type of Home: Apartment Home Access: Elevator Home Layout: One level Home Equipment: Environmental consultant - 2 wheels;Tub bench;Bedside commode Prior Function Level of Independence: Needs assistance Gait / Transfers Assistance Needed: pt has had difficulty getting into and out of bed and has been sleeping in her recliner and walking short distances with RW ADL's / Homemaking Assistance Needed: aide comes 3hrs/ day 7days/wk to make lunch and help pt to bathe and dress. Dgtrs alternate checking on pt and fix her dinner Comments: pt does not drive and family checks in but is alone the majority of the day Communication Communication: HOH    Cognition  Cognition Arousal/Alertness: Awake/alert Behavior During Therapy: Flat affect Overall Cognitive Status: Impaired/Different from baseline Area of Impairment: Orientation;Safety/judgement Orientation Level: Time;Situation Memory: Decreased recall of precautions;Decreased short-term memory Safety/Judgement: Decreased awareness of safety General Comments: pt unable to state how to contact fire dept if there were a fire     Extremity/Trunk Assessment Upper Extremity Assessment Upper Extremity Assessment: Generalized weakness Lower Extremity Assessment Lower Extremity Assessment: Generalized weakness Cervical / Trunk Assessment Cervical / Trunk Assessment: Kyphotic   Balance Balance Balance Assessed: Yes Static Sitting Balance Static Sitting - Balance Support: Feet supported;No upper extremity supported Static Sitting - Level of Assistance: 5: Stand by assistance Static Sitting - Comment/# of Minutes: 4 Static Standing Balance Static Standing - Balance Support: Bilateral upper extremity  supported Static Standing - Level of Assistance: 5: Stand by assistance Static Standing - Comment/# of Minutes: 2  End of Session PT - End of Session Equipment Utilized During Treatment: Gait belt Activity Tolerance: Patient limited by fatigue Patient left: in chair;with call bell/phone within reach;with family/visitor present;with nursing/sitter in room Nurse Communication: Mobility status  GP     Delorse Lek 03/01/2013, 1:36 PM Delaney Meigs, PT [phone]

## 2013-03-01 NOTE — Progress Notes (Signed)
Agree with the above assessment and plan. INR relatively stable. Will continue home dose of warfarin for now. Recheck INR in am, if continues to be stable will consider changing to 3xweek checks.  Sheppard Coil PharmD., BCPS Clinical Pharmacist Pager [phone] 03/01/2013 11:29 AM

## 2013-03-01 NOTE — Progress Notes (Signed)
Admitted pt from ED via stretcher, oriented to room, call bell placed within reach, pt on insulin drip @ 3.1cc/hr. Admission assessment done, orders carried out. SR on heart monitor, denied pain at this time. Daughter at bedside. Filed Vitals:   03/01/13 0014  BP: 130/44  Pulse: 72  Temp: 98 F (36.7 C)  Resp: 18   Krystal [name], [address]

## 2013-03-01 NOTE — Progress Notes (Signed)
Pt CBG 442 this pm. K Schorr notified and new order for 10 units novolog and re-check CBG at 3AM. Pt with fluids going, edema to legs and fine crackles in lungs. New order to d/c fluid

## 2013-03-01 NOTE — H&P (Signed)
[facility] History and Physical  SCHAE CANDO ZOX:[id number] DOB: 1924-08-23 DOA: 02/28/2013  Referring physician: ER physician. PCP: Mickie Hillier, MD   Chief Complaint: Elevated blood sugar.  HPI: Krystal [name] is a 79 y.o. female with known history of diabetes mellitus type 2, atrial fibrillation and PE on Coumadin, chronic lower extremity edema, hypertension had gone for routine follow with her PCP yesterday for checking INR went patient collection was found to be more than 700 and was referred to the ER. In the ER patient's blood sugar was found to be around 790 and IV insulin infusion was started. VBG shows pH of 7.29 but patient's anion gap is only 14. Repeat metabolic panel and ketones were ordered. Patient has been admitted for further management. On exam patient has taken mouth of both lower extremities. On discussion with patient's daughter, patient daughter feels that the erythema may have been more than usual. Patient otherwise did not have any fever chills shortness of breath productive cough nausea vomiting abdominal pain or diarrhea.   Review of Systems: As presented in the history of presenting illness, rest negative.  Past Medical History  Diagnosis Date  . Hypertension   . GERD (gastroesophageal reflux disease)   . Diabetes mellitus   . History of atrial fibrillation EPISODE YRS AGO  . Urinary retention     s/p suprapubic catheter November 2013  . Coronary artery disease cardiologist- dr Maylon Cos--  lov 09-12-2010 in epic    non-obstructinve min. cad  . Foley catheter in place   . Edema of lower extremity BILATERAL LEG  . Hyperlipidemia   . History of pulmonary embolus (PE) 2010--  TAKES COUMADIN  . History of DVT of lower extremity   . Anticoagulated on Coumadin   . Generalized weakness AGE RELATED--  USES WALKER  . Vertigo   . OA (osteoarthritis)   . Thin skin FRAGILE  . Hypothyroidism NO MEDS  . Impaired hearing BILATERAL AIDS  . Chronic diarrhea  INTERMITTANT   Past Surgical History  Procedure Laterality Date  . Knee surgery    . Tubal ligation    . Abdominal hysterectomy    . Cardiac catheterization  09-27-2004  DR TENNENT    NORMAL LVF/ MILD IRREGULARITIES IN MID LAD W/ NO SIGNIFICANT OBSTRUCTIVE DISEASE  . Abdominal hysterectomy  1994  (APPROX)    W/ BILATERAL SALPINGO-OOPHORECTOMY  . Cholecystectomy  2000  . Knee arthroscopy  1998  (APPROX)  . Bladder suspension  2002  (APPROX)    W/ ANTERIOR AND POSTERIOR REPAIR  . Cataract extraction w/ intraocular lens  implant, bilateral    . Transthoracic echocardiogram  02-23-2009  (DX W/ PE & PAF)    EF 65-70%/ MILD MR/ MODERATE TR/ MILDLY DILATED RA  &  LA/  RV SYSTOLIC FUNCTION MODERATLY REDUCED   . Insertion of suprapubic catheter  04/02/2012    Procedure: INSERTION OF SUPRAPUBIC CATHETER;  Surgeon: Garnett Farm, MD;  Location: [facility];  Service: Urology;  Laterality: N/A;  SUPRAPUBIC TUBE PLACEMENT  . Cystoscopy  04/02/2012    Procedure: CYSTOSCOPY;  Surgeon: Garnett Farm, MD;  Location: [facility];  Service: Urology;;   Social History:  reports that she has never smoked. Her smokeless tobacco use includes Snuff. She reports that she does not drink alcohol or use illicit drugs. Where does patient live home. Can patient participate in ADLs? Yes.  Allergies  Allergen Reactions  . Latex Hives  . Detrol [Tolterodine Tartrate] Hives  .  Humibid La [Guaifenesin] Other (See Comments)    UNKNOWN  . Keflex [Cephalexin]     hives  . Vioxx [Rofecoxib] Hives    Family History:  Family History  Problem Relation Age of Onset  . Heart disease Mother   . Hypertension Mother       Prior to Admission medications   Medication Sig Start Date End Date Taking? Authorizing Provider  ALPRAZolam (XANAX) 0.25 MG tablet Take 0.125-0.25 mg by mouth 2 (two) times daily as needed for sleep or anxiety (0.5-1 tablet).    Yes Historical Provider, MD   cetirizine (ZYRTEC) 10 MG chewable tablet Chew 10 mg by mouth daily.   Yes Historical Provider, MD  diltiazem (CARDIZEM) 120 MG tablet Take 120 mg by mouth 2 (two) times daily.   Yes Historical Provider, MD  docusate sodium (COLACE) 100 MG capsule Take 100 mg by mouth 2 (two) times daily as needed for constipation.   Yes Historical Provider, MD  ferrous sulfate 325 (65 FE) MG tablet Take 325 mg by mouth at bedtime.    Yes Historical Provider, MD  Fluticasone-Salmeterol (ADVAIR) 250-50 MCG/DOSE AEPB Inhale 1 puff into the lungs every 12 (twelve) hours.   Yes Historical Provider, MD  furosemide (LASIX) 40 MG tablet Take 60 mg by mouth daily. Take 1.5 tablets 09/27/12  Yes Tonny Bollman, MD  glimepiride (AMARYL) 4 MG tablet Take 4 mg by mouth daily.    Yes Historical Provider, MD  lidocaine (LIDODERM) 5 % Place 1 patch onto the skin daily as needed. Remove & Discard patch within 12 hours or as directed by MD   Yes Historical Provider, MD  losartan (COZAAR) 100 MG tablet Take 100 mg by mouth daily.   Yes Historical Provider, MD  metoCLOPramide (REGLAN) 5 MG tablet Take 2.5 mg by mouth 4 (four) times daily. 1/2 tablet 4 times daily   Yes Historical Provider, MD  mirtazapine (REMERON) 15 MG tablet Take 15 mg by mouth at bedtime.   Yes Historical Provider, MD  omeprazole (PRILOSEC) 40 MG capsule Take 40 mg by mouth daily.   Yes Historical Provider, MD  oxybutynin (DITROPAN-XL) 5 MG 24 hr tablet Take 5 mg by mouth daily.   Yes Historical Provider, MD  pravastatin (PRAVACHOL) 20 MG tablet Take 10 mg by mouth daily.    Yes Historical Provider, MD  sitaGLIPtin (JANUVIA) 50 MG tablet Take 50 mg by mouth daily.   Yes Historical Provider, MD  traMADol (ULTRAM) 50 MG tablet Take 50 mg by mouth every 6 (six) hours as needed. Pain   Yes Historical Provider, MD  vitamin B-12 (CYANOCOBALAMIN) 1000 MCG tablet Take 1,000 mcg by mouth daily.   Yes Historical Provider, MD  warfarin (COUMADIN) 5 MG tablet Take 5 mg by  mouth daily.   Yes Historical Provider, MD    Physical Exam: Filed Vitals:   02/28/13 2145 02/28/13 2230 02/28/13 2300 03/01/13 0014  BP: 129/54 137/55 114/49 130/44  Pulse: 81 80 73 72  Temp:    98 F (36.7 C)  TempSrc:    Oral  Resp: 15 17 19 18   Height:    5\' 1"  (1.549 m)  Weight:    90.4 kg (199 lb 4.7 oz)  SpO2: 97% 97% 98% 97%     General:  Well-developed and nourished.  Eyes: Anicteric no pallor.  ENT: No discharge from ears eyes nose mouth.  Neck: No mass felt.  Cardiovascular: S1-S2 heard.  Respiratory: No rhonchi or crepitations.  Abdomen: Soft nontender  bowel sounds present.  Skin: Erythema of the both lower extremities.  Musculoskeletal: Mild edema of the both lower extremities. With mild oozing of fluid.  Psychiatric: Appears normal.  Neurologic: Alert awake oriented to time place and person. Moves all extremities.  Labs on Admission:  Basic Metabolic Panel:  Recent Labs Lab 02/28/13 1916  NA 128*  K 4.4  CL 92*  CO2 22  GLUCOSE 790*  BUN 31*  CREATININE 1.68*  CALCIUM 8.2*   Liver Function Tests:  Recent Labs Lab 02/28/13 1916  AST 9  ALT 7  ALKPHOS 171*  BILITOT 0.2*  PROT 6.7  ALBUMIN 3.2*   No results found for this basename: LIPASE, AMYLASE,  in the last 168 hours No results found for this basename: AMMONIA,  in the last 168 hours CBC:  Recent Labs Lab 02/28/13 1916  WBC 10.4  HGB 10.9*  HCT 31.0*  MCV 80.5  PLT 278   Cardiac Enzymes:  Recent Labs Lab 02/28/13 2030  TROPONINI <0.30    BNP (last 3 results)  Recent Labs  03/25/12 1040 07/12/12 1228 02/28/13 2030  PROBNP 287.5 62.0 478.4*   CBG:  Recent Labs Lab 02/22/13 0845 02/28/13 1906 02/28/13 2118 02/28/13 2222 02/28/13 2325  GLUCAP 536* >600* >600* 519* 369*    Radiological Exams on Admission: Dg Chest 2 View  02/28/2013   CLINICAL DATA:  Chest pain with shortness of breath.  EXAM: CHEST  2 VIEW  COMPARISON:  [facility]  radiograph 07/06/2012.  FINDINGS: Low lung volumes. Cardiomegaly. Calcified tortuous aorta. Left lower chest lipoma appears stable. No infiltrates or failure. Osseous demineralization. No pneumothorax. Similar appearance to priors except for low lung volumes.  IMPRESSION: No active cardiopulmonary disease.   Electronically Signed   By: Davonna Belling M.D.   On: 02/28/2013 22:28     Assessment/Plan Active Problems:   Diabetes mellitus type 2, uncontrolled   Lower extremity edema   Atrial fibrillation   History of pulmonary embolism   ARF (acute renal failure)   1. Uncontrolled diabetes mellitus type 2 - I don't think patient is in DKA. Repeat metabolic panel and ketones have been ordered to check for any worsening of anion gap. UA is pending. Not sure what precipitated patient's blood sugar to go so high. Patient's lower extremity does look erythematous and suspect cellulitis for which I have placed patient on vancomycin. 2. Acute on chronic renal failure  - last creatinine was around 1.3. Patient's present creatinine is 1.6. Patient did receive IV fluids. We'll gently hydrate for another 12 hours and discontinue fluids. For now we'll hold off Lasix. Which can be continued once patient blood sugar has improved and creatinine shows no further decline. 3. History of atrial fibrillation and PE  - on Coumadin. Coumadin per pharmacy. Rate is controlled presently. 4. Lower extremity edema and possible cellulitis - patient has been placed on vancomycin. Closely observe clinically.    Code Status:  full code.  Family Communication:  patient's daughter at the bedside.   Disposition Plan:  admit to inpatient.     KAKRAKANDY,ARSHAD N. [facility] Pager [phone].  If 7PM-7AM, please contact night-coverage [email or web address] Password [email or web address] 03/01/2013, 12:30 AM

## 2013-03-01 NOTE — Progress Notes (Signed)
ANTICOAGULATION and ANTIBIOTIC CONSULT NOTE - Initial Consult  Pharmacy Consult for Coumadin and Vancocin Indication: atrial fibrillation and cellulitis  Allergies  Allergen Reactions  . Latex Hives  . Detrol [Tolterodine Tartrate] Hives  . Humibid La [Guaifenesin] Other (See Comments)    UNKNOWN  . Keflex [Cephalexin]     hives  . Vioxx [Rofecoxib] Hives    Patient Measurements: Height: 5\' 1"  (154.9 cm) Weight: 199 lb 4.7 oz (90.4 kg) (bed) IBW/kg (Calculated) : 47.8  Vital Signs: Temp: 98 F (36.7 C) (10/14 0014) Temp src: Oral (10/14 0014) BP: 130/44 mmHg (10/14 0014) Pulse Rate: 72 (10/14 0014)  Labs:  Recent Labs  02/28/13 1916 02/28/13 2030  HGB 10.9*  --   HCT 31.0*  --   PLT 278  --   LABPROT  --  25.1*  INR  --  2.37*  CREATININE 1.68*  --   TROPONINI  --  <0.30    Estimated Creatinine Clearance: 24.6 ml/min (by C-G formula based on Cr of 1.68).   Medical History: Past Medical History  Diagnosis Date  . Hypertension   . GERD (gastroesophageal reflux disease)   . Diabetes mellitus   . History of atrial fibrillation EPISODE YRS AGO  . Urinary retention     s/p suprapubic catheter November 2013  . Coronary artery disease cardiologist- dr Maylon Cos--  lov 09-12-2010 in epic    non-obstructinve min. cad  . Foley catheter in place   . Edema of lower extremity BILATERAL LEG  . Hyperlipidemia   . History of pulmonary embolus (PE) 2010--  TAKES COUMADIN  . History of DVT of lower extremity   . Anticoagulated on Coumadin   . Generalized weakness AGE RELATED--  USES WALKER  . Vertigo   . OA (osteoarthritis)   . Thin skin FRAGILE  . Hypothyroidism NO MEDS  . Impaired hearing BILATERAL AIDS  . Chronic diarrhea INTERMITTANT    Medications:  Prescriptions prior to admission  Medication Sig Dispense Refill  . ALPRAZolam (XANAX) 0.25 MG tablet Take 0.125-0.25 mg by mouth 2 (two) times daily as needed for sleep or anxiety (0.5-1 tablet).       .  cetirizine (ZYRTEC) 10 MG chewable tablet Chew 10 mg by mouth daily.      Marland Kitchen diltiazem (CARDIZEM) 120 MG tablet Take 120 mg by mouth 2 (two) times daily.      Marland Kitchen docusate sodium (COLACE) 100 MG capsule Take 100 mg by mouth 2 (two) times daily as needed for constipation.      . ferrous sulfate 325 (65 FE) MG tablet Take 325 mg by mouth at bedtime.       . Fluticasone-Salmeterol (ADVAIR) 250-50 MCG/DOSE AEPB Inhale 1 puff into the lungs every 12 (twelve) hours.      . furosemide (LASIX) 40 MG tablet Take 60 mg by mouth daily. Take 1.5 tablets  1 tablet    . glimepiride (AMARYL) 4 MG tablet Take 4 mg by mouth daily.       Marland Kitchen lidocaine (LIDODERM) 5 % Place 1 patch onto the skin daily as needed. Remove & Discard patch within 12 hours or as directed by MD      . losartan (COZAAR) 100 MG tablet Take 100 mg by mouth daily.      . metoCLOPramide (REGLAN) 5 MG tablet Take 2.5 mg by mouth 4 (four) times daily. 1/2 tablet 4 times daily      . mirtazapine (REMERON) 15 MG tablet Take 15 mg by mouth  at bedtime.      Marland Kitchen omeprazole (PRILOSEC) 40 MG capsule Take 40 mg by mouth daily.      Marland Kitchen oxybutynin (DITROPAN-XL) 5 MG 24 hr tablet Take 5 mg by mouth daily.      . pravastatin (PRAVACHOL) 20 MG tablet Take 10 mg by mouth daily.       . sitaGLIPtin (JANUVIA) 50 MG tablet Take 50 mg by mouth daily.      . traMADol (ULTRAM) 50 MG tablet Take 50 mg by mouth every 6 (six) hours as needed. Pain      . vitamin B-12 (CYANOCOBALAMIN) 1000 MCG tablet Take 1,000 mcg by mouth daily.      Marland Kitchen warfarin (COUMADIN) 5 MG tablet Take 5 mg by mouth daily.       Scheduled:  . diltiazem  120 mg Oral BID  . ferrous sulfate  325 mg Oral QHS  . glimepiride  4 mg Oral Daily  . insulin regular  0-10 Units Intravenous TID WC  . linagliptin  5 mg Oral Daily  . loratadine  10 mg Oral Daily  . losartan  100 mg Oral Daily  . metoCLOPramide  2.5 mg Oral QID  . mirtazapine  15 mg Oral QHS  . mometasone-formoterol  2 puff Inhalation BID  .  oxybutynin  5 mg Oral Daily  . pantoprazole  40 mg Oral Daily  . simvastatin  5 mg Oral q1800  . sodium chloride  3 mL Intravenous Q12H  . vitamin B-12  1,000 mcg Oral Daily   Infusions:  . sodium chloride    . insulin (NOVOLIN-R) infusion      Assessment: 79yo female was sent to ED by PCP for BS at 758, up to 790 in ED, concern for infection causing hyperglycemia in non-insulin-dependent DM2, to begin IV ABX for possible cellulitis as well as continue Coumadin for Afib and h/o DVT/PE, admitted w/ therapeutic INR.  Goal of Therapy:  INR 2-3 Vanc trough 10-15   Plan:  Will begin vancomycin 750mg  IV Q24H and monitor CBC, Cx, levels prn; will continue home Coumadin dose of 5mg  daily and monitor INR for adjustments.  Vernard Gambles, PharmD, BCPS  03/01/2013,12:45 AM

## 2013-03-01 NOTE — Progress Notes (Signed)
Pt's CBG=215, Dr. Toniann Fail notified and ordered to discontinue insulin drip, 5 units of lantus given and CBG check ACHS. Will continue to monitor.

## 2013-03-01 NOTE — Progress Notes (Signed)
Pt O3x, Pt setting in chair. No complaints of pain or SOB. Pt states she wants to go home. Will continue to monitor

## 2013-03-01 NOTE — Progress Notes (Signed)
Patient seen and examined. Admitted after midnight secondary to hyperglycemia and feeling lightheaded. On arrival CBG's > 700; after IVF's and insulin drip CBG's now in the 240-260's range. A1C 12.4. Will start lantus QHS and continue SSI. Will need to evaluate insulin needs and address initial regimen prior to discharge. Patient also deconditioned, PT/OT asked to evaluate and treat. Please referred to H&P by Dr. Toniann Fail for further info/details on admission and assessment and plan.  Chloeann Alfred [phone]

## 2013-03-01 NOTE — Progress Notes (Signed)
ANTICOAGULATION CONSULT NOTE - Follow up Consult  Pharmacy Consult for Coumadin Indication: atrial fibrillation   Allergies  Allergen Reactions  . Latex Hives  . Detrol [Tolterodine Tartrate] Hives  . Humibid La [Guaifenesin] Other (See Comments)    UNKNOWN  . Keflex [Cephalexin]     hives  . Vioxx [Rofecoxib] Hives    Patient Measurements: Height: 5\' 1"  (154.9 cm) Weight: 199 lb 4.7 oz (90.4 kg) IBW/kg (Calculated) : 47.8  Vital Signs: Temp: 98.1 F (36.7 C) (10/14 0551) Temp src: Oral (10/14 0551) BP: 107/54 mmHg (10/14 0937) Pulse Rate: 72 (10/14 0937)  Labs:  Recent Labs  02/28/13 1916 02/28/13 2030 03/01/13 0030 03/01/13 0456  HGB 10.9*  --   --  9.5*  HCT 31.0*  --   --  27.6*  PLT 278  --   --  236  LABPROT  --  25.1*  --  27.6*  INR  --  2.37*  --  2.68*  CREATININE 1.68*  --  1.48* 1.47*  TROPONINI  --  <0.30  --   --     Estimated Creatinine Clearance: 28.1 ml/min (by C-G formula based on Cr of 1.47).  Assessment: 79yo female on coumadin for Afib and h/o DVT/PE. INR remains stable and therapeutic. No signs of bleeding noted.   Goal of Therapy:  INR 2-3   Plan:  Coumadin dose of 5mg  daily and monitor INR for adjustments.  Vinnie Level, PharmD.  [place] License #[id number] Application for [place] reciprocity pending  Clinical Pharmacist Pager [phone]

## 2013-03-01 NOTE — Progress Notes (Signed)
Utilization Review Completed Kacey Dysert J. Tylyn Stankovich, RN, BSN, NCM [phone]  

## 2013-03-01 NOTE — Progress Notes (Signed)
Inpatient Diabetes Program Recommendations  AACE/ADA: New Consensus Statement on Inpatient Glycemic Control (2013)  Target Ranges:  Prepandial:   less than 140 mg/dL      Peak postprandial:   less than 180 mg/dL (1-2 hours)      Critically ill patients:  140 - 180 mg/dL  Results for IVONNA, KINNICK (MRN [id number]) as of 03/01/2013 14:17  Ref. Range 02/28/2013 23:25 03/01/2013 00:40 03/01/2013 01:40 03/01/2013 05:57 03/01/2013 11:40  Glucose-Capillary Latest Range: 70-99 mg/dL 147 (H) 829 (H) 562 (H) 249 (H) 323 (H)   A1C=12.4  MD, will patient be discharged home on insulin?  Please address. Thank you  Piedad Climes BSN, RN,CDE Inpatient Diabetes Coordinator [phone] (team pager)

## 2013-03-02 DIAGNOSIS — E1101 Type 2 diabetes mellitus with hyperosmolarity with coma: Secondary | ICD-10-CM | POA: Diagnosis present

## 2013-03-02 DIAGNOSIS — I4891 Unspecified atrial fibrillation: Secondary | ICD-10-CM

## 2013-03-02 DIAGNOSIS — E119 Type 2 diabetes mellitus without complications: Secondary | ICD-10-CM

## 2013-03-02 LAB — GLUCOSE, CAPILLARY
Glucose-Capillary: 273 mg/dL — ABNORMAL HIGH (ref 70–99)
Glucose-Capillary: 271 mg/dL — ABNORMAL HIGH (ref 70–99)
Glucose-Capillary: 295 mg/dL — ABNORMAL HIGH (ref 70–99)
Glucose-Capillary: 331 mg/dL — ABNORMAL HIGH (ref 70–99)

## 2013-03-02 LAB — BASIC METABOLIC PANEL
BUN: 26 mg/dL — ABNORMAL HIGH (ref 6–23)
Calcium: 7.9 mg/dL — ABNORMAL LOW (ref 8.4–10.5)
Creatinine, Ser: 1.54 mg/dL — ABNORMAL HIGH (ref 0.50–1.10)
GFR calc Af Amer: 34 mL/min — ABNORMAL LOW (ref >90)
GFR calc non Af Amer: 29 mL/min — ABNORMAL LOW (ref >90)
Glucose, Bld: 265 mg/dL — ABNORMAL HIGH (ref 70–99)
Potassium: 3.8 mEq/L (ref 3.5–5.1)

## 2013-03-02 LAB — URINE CULTURE: Colony Count: >=100000

## 2013-03-02 LAB — PROTIME-INR: Prothrombin Time: 30.2 seconds — ABNORMAL HIGH (ref 11.6–15.2)

## 2013-03-02 MED ORDER — WARFARIN SODIUM 2.5 MG PO TABS
2.5000 mg | ORAL_TABLET | Freq: Once | ORAL | Status: AC
  Administered 2013-03-02: 17:00:00 2.5 mg via ORAL
  Filled 2013-03-02: qty 1

## 2013-03-02 MED ORDER — INSULIN GLARGINE 100 UNIT/ML ~~LOC~~ SOLN
25.0000 [IU] | Freq: Every day | SUBCUTANEOUS | Status: DC
  Administered 2013-03-02: 21:00:00 25 [IU] via SUBCUTANEOUS
  Filled 2013-03-02 (×2): qty 0.25

## 2013-03-02 MED ORDER — BD GETTING STARTED TAKE HOME KIT: 1/2ML X 30G SYRINGES
1.0000 | Freq: Once | Status: DC
  Filled 2013-03-02: qty 1

## 2013-03-02 MED ORDER — BD GETTING STARTED TAKE HOME KIT: 3/10ML X 30G SYRINGES
1.0000 | Freq: Once | Status: AC
  Administered 2013-03-02: 12:00:00 1
  Filled 2013-03-02: qty 1

## 2013-03-02 NOTE — Progress Notes (Signed)
ANTICOAGULATION CONSULT NOTE - Follow up Consult  Pharmacy Consult for Coumadin Indication: atrial fibrillation   Allergies  Allergen Reactions  . Latex Hives  . Detrol [Tolterodine Tartrate] Hives  . Humibid La [Guaifenesin] Other (See Comments)    UNKNOWN  . Keflex [Cephalexin]     hives  . Vioxx [Rofecoxib] Hives    Patient Measurements: Height: 5\' 1"  (154.9 cm) Weight: 203 lb 11.3 oz (92.4 kg) (Bed Scale ) IBW/kg (Calculated) : 47.8  Vital Signs: Temp: 97.9 F (36.6 C) (10/15 0607) Temp src: Oral (10/15 0607) BP: 137/71 mmHg (10/15 1058) Pulse Rate: 60 (10/15 0607)  Labs:  Recent Labs  02/28/13 1916 02/28/13 2030 03/01/13 0030 03/01/13 0456 03/02/13 0540  HGB 10.9*  --   --  9.5*  --   HCT 31.0*  --   --  27.6*  --   PLT 278  --   --  236  --   LABPROT  --  25.1*  --  27.6* 30.2*  INR  --  2.37*  --  2.68* 3.02*  CREATININE 1.68*  --  1.48* 1.47* 1.54*  TROPONINI  --  <0.30  --   --   --     Estimated Creatinine Clearance: 27.2 ml/min (by C-G formula based on Cr of 1.54).  Assessment: 79yo female on coumadin for Afib and h/o DVT/PE. INR remains stable and essentially therapeutic. No signs of bleeding noted.   Goal of Therapy:  INR 2-3   Plan:  Coumadin dose of 2.5 mg once tonight Monitor INR for adjustments.  Vinnie Level, PharmD.  [place] License #[id number] Application for [place] reciprocity pending  Clinical Pharmacist Pager [phone]

## 2013-03-02 NOTE — Progress Notes (Signed)
[facility] PROGRESS NOTE  Assessment/Plan:  Diabetes mellitus type 2, uncontrolled/HHNS: - Started on insulin drip on admission. - Transition to long acting. Plus SSI. - d/c amaryl, cont tradjenta.  Lower extremity edema due to chronic venous insufficiency: - redness and hypertrophic skin not new.  Atrial fibrillation: - rate controlled. - INR thx.   History of pulmonary embolism - on coumadin.  CKD stage III: - Cr at baseline.   Code Status: full code.  Family Communication: patient's daughter at the bedside.  Disposition Plan: admit to inpatient.     Consultants:  none  Procedures:  non  Antibiotics:  vanc 10.15.2014 stopped on same date  HPI/Subjective: No compalins.  Objective: Filed Vitals:   03/01/13 2044 03/01/13 2103 03/02/13 0607 03/02/13 0900  BP:  112/50 114/44   Pulse:   60   Temp:   97.9 F (36.6 C)   TempSrc:   Oral   Resp:   19   Height:      Weight:   92.4 kg (203 lb 11.3 oz)   SpO2: 98%  96% 96%    Intake/Output Summary (Last 24 hours) at 03/02/13 0956 Last data filed at 03/02/13 0953  Gross per 24 hour  Intake 2349.57 ml  Output   1525 ml  Net 824.57 ml   Filed Weights   03/01/13 0014 03/01/13 0551 03/02/13 0607  Weight: 90.4 kg (199 lb 4.7 oz) 90.4 kg (199 lb 4.7 oz) 92.4 kg (203 lb 11.3 oz)    Exam:  General: Alert, awake, oriented x3, in no acute distress.  HEENT: No bruits, no goiter.  Heart: Regular rate and rhythm, without murmurs, rubs, gallops.  Lungs: Good air movement, clear to auscultation. Abdomen: Soft, nontender, nondistended, positive bowel sounds.  Neuro: Grossly intact, nonfocal.   Data Reviewed: Basic Metabolic Panel:  Recent Labs Lab 02/28/13 1916 03/01/13 0030 03/01/13 0456 03/02/13 0540  NA 128* 136 134* 134*  K 4.4 3.9 3.6 3.8  CL 92* 101 101 102  CO2 22 22 23 20   GLUCOSE 790* 282* 261* 265*  BUN 31* 28* 26* 26*  CREATININE 1.68* 1.48* 1.47* 1.54*  CALCIUM 8.2* 8.2* 8.1*  7.9*   Liver Function Tests:  Recent Labs Lab 02/28/13 1916  AST 9  ALT 7  ALKPHOS 171*  BILITOT 0.2*  PROT 6.7  ALBUMIN 3.2*   No results found for this basename: LIPASE, AMYLASE,  in the last 168 hours No results found for this basename: AMMONIA,  in the last 168 hours CBC:  Recent Labs Lab 02/28/13 1916 03/01/13 0456  WBC 10.4 9.1  HGB 10.9* 9.5*  HCT 31.0* 27.6*  MCV 80.5 79.1  PLT 278 236   Cardiac Enzymes:  Recent Labs Lab 02/28/13 2030  TROPONINI <0.30   BNP (last 3 results)  Recent Labs  03/25/12 1040 07/12/12 1228 02/28/13 2030  PROBNP 287.5 62.0 478.4*   CBG:  Recent Labs Lab 03/01/13 1140 03/01/13 1621 03/01/13 2202 03/02/13 0427 03/02/13 0559  GLUCAP 323* 366* 442* 273* 271*    No results found for this or any previous visit (from the past 240 hour(s)).   Studies: Dg Chest 2 View  02/28/2013   CLINICAL DATA:  Chest pain with shortness of breath.  EXAM: CHEST  2 VIEW  COMPARISON:  [facility] radiograph 07/06/2012.  FINDINGS: Low lung volumes. Cardiomegaly. Calcified tortuous aorta. Left lower chest lipoma appears stable. No infiltrates or failure. Osseous demineralization. No pneumothorax. Similar appearance to priors except for low lung  volumes.  IMPRESSION: No active cardiopulmonary disease.   Electronically Signed   By: Davonna Belling M.D.   On: 02/28/2013 22:28    Scheduled Meds: . bd getting started take home kit  1 kit Other Once  . diltiazem  120 mg Oral BID  . ferrous sulfate  325 mg Oral QHS  . glimepiride  4 mg Oral Daily  . insulin aspart  0-9 Units Subcutaneous TID WC  . insulin glargine  25 Units Subcutaneous QHS  . linagliptin  5 mg Oral Daily  . loratadine  10 mg Oral Daily  . losartan  100 mg Oral Daily  . metoCLOPramide  2.5 mg Oral QID  . mirtazapine  15 mg Oral QHS  . mometasone-formoterol  2 puff Inhalation BID  . oxybutynin  5 mg Oral Daily  . pantoprazole  40 mg Oral Daily  . simvastatin  5 mg  Oral q1800  . sodium chloride  3 mL Intravenous Q12H  . vancomycin  750 mg Intravenous Q24H  . vitamin B-12  1,000 mcg Oral Daily  . warfarin  5 mg Oral q1800  . Warfarin - Pharmacist Dosing Inpatient   Does not apply q1800   Continuous Infusions:    Marinda Elk  [facility] Pager [phone]. If 8PM-8AM, please contact night-coverage at [email or web address], password [facility] 03/02/2013, 9:56 AM  LOS: 2 days

## 2013-03-02 NOTE — Progress Notes (Signed)
Pt daughter instructed on how to use educational videos on television in room. Pt given sheet with Diabetes videos highlighted. Daughter states pt will watch videos tomorrow when awake. Baron Hamper, RN

## 2013-03-02 NOTE — Evaluation (Signed)
Occupational Therapy Evaluation Patient Details Name: Krystal [name] MRN: [id number] DOB: November 28, 1924 Today's Date: 03/02/2013 Time: 4098-1191 OT Time Calculation (min): 33 min  OT Assessment / Plan / Recommendation History of present illness Krystal [name] is a 79 y.o. female with known history of diabetes mellitus type 2, atrial fibrillation and PE on Coumadin, chronic lower extremity edema, hypertension had gone for routine follow with her PCP yesterday for checking INR went patient collection was found to be more than 700 and was referred to the ER   Clinical Impression   Pt presents with below problem list. Pt will benefit from acute OT to increase safety and independence prior to d/c. Daughter states no one will be with pt 24/7, so recommending SNF for rehab, which pt does not want to do. Recommend HHOT if pt refuses SNF.   OT Assessment  Patient needs continued OT Services    Follow Up Recommendations  SNF;Supervision/Assistance - 24 hour    Barriers to Discharge      Equipment Recommendations  Other (comment) (defer to next venue)    Recommendations for Other Services    Frequency  Min 2X/week    Precautions / Restrictions Precautions Precautions: Fall Restrictions Weight Bearing Restrictions: No   Pertinent Vitals/Pain No pain reported.     ADL  Grooming: Minimal assistance (daughter reports she gets assist with curling hair) Where Assessed - Grooming: Supported sitting Upper Body Bathing: Set up;Supervision/safety Where Assessed - Upper Body Bathing: Supported sitting Lower Body Bathing: Moderate assistance Where Assessed - Lower Body Bathing: Supported sit to stand Upper Body Dressing: Set up Where Assessed - Upper Body Dressing: Supported sitting Lower Body Dressing: Maximal assistance Where Assessed - Lower Body Dressing: Supported sit to Pharmacist, hospital: Hydrographic surveyor Method: Sit to Barista: Other (comment) (from  recliner chair) Tub/Shower Transfer Method: Not assessed Equipment Used: Gait belt;Rolling walker;Long-handled sponge Transfers/Ambulation Related to ADLs: Min guard  ADL Comments: Daughter present during session. Educated to remove rugs and she states there are some in bathroom so I recommended putting non-skid rugs in there. Educated on techniques to assist with medication management as she has memory deficits. Educated to keep carry items in bag/basket on walker to be sure hands are on walker at all times and not carrying items. Donned underwear with assist. Educated on elastic shoe laces and gave some to daughter. Pt has sockaid at home and daughter states she does well with that.  Educated on use of long handled sponge for LB bathing. Educated and demonstrated on pressure relief techniques and told pt to do this several times throughout day. Ambulated in hallway.    OT Diagnosis: Generalized weakness;Cognitive deficits  OT Problem List: Decreased strength;Decreased activity tolerance;Decreased safety awareness;Decreased knowledge of use of DME or AE;Decreased knowledge of precautions;Increased edema OT Treatment Interventions: Self-care/ADL training;Therapeutic exercise;DME and/or AE instruction;Therapeutic activities;Patient/family education;Balance training;Cognitive remediation/compensation   OT Goals(Current goals can be found in the care plan section) Acute Rehab OT Goals Patient Stated Goal: go home OT Goal Formulation: With patient/family Time For Goal Achievement: 03/09/13 Potential to Achieve Goals: Good ADL Goals Pt Will Perform Lower Body Bathing: with min assist;with adaptive equipment;sit to/from stand Pt Will Perform Lower Body Dressing: with min assist;with adaptive equipment;sit to/from stand Pt Will Transfer to Toilet: with modified independence;ambulating;bedside commode (3 in 1 over commode) Pt Will Perform Toileting - Clothing Manipulation and hygiene: with modified  independence;sit to/from stand Additional ADL Goal #1: Pt will demonstrate pressure relief  techniques independently.  Visit Information  Last OT Received On: 03/02/13 Assistance Needed: +1 History of Present Illness: Krystal [name] is a 79 y.o. female with known history of diabetes mellitus type 2, atrial fibrillation and PE on Coumadin, chronic lower extremity edema, hypertension had gone for routine follow with her PCP yesterday for checking INR went patient collection was found to be more than 700 and was referred to the ER       Prior Functioning     Home Living Family/patient expects to be discharged to:: Private residence Living Arrangements: Alone Available Help at Discharge: Family;Personal care attendant Type of Home: Apartment Home Access: Elevator Home Layout: One level Home Equipment: Environmental consultant - 2 wheels;Tub bench;Bedside commode Prior Function Level of Independence: Needs assistance Gait / Transfers Assistance Needed: pt has had difficulty getting into and out of bed and has been sleeping in her recliner and walking short distances with RW ADL's / Homemaking Assistance Needed: aide comes 3hrs/ day 7days/wk to make lunch and help pt to bathe and dress. Dgtrs alternate checking on pt and fix her dinner Comments: pt does not drive and family checks in but is alone the majority of the day Communication Communication: HOH         Vision/Perception Vision - History Baseline Vision: Wears glasses only for reading   Cognition  Cognition Arousal/Alertness: Awake/alert Behavior During Therapy: WFL for tasks assessed/performed Overall Cognitive Status: History of cognitive impairments - at baseline Area of Impairment: Orientation;Safety/judgement Orientation Level: Time Safety/Judgement: Decreased awareness of safety    Extremity/Trunk Assessment Upper Extremity Assessment Upper Extremity Assessment: Overall WFL for tasks assessed Lower Extremity Assessment Lower  Extremity Assessment: Defer to PT evaluation     Mobility Bed Mobility Bed Mobility: Supine to Sit;Sit to Supine Supine to Sit: 3: Mod assist Sit to Supine: 5: Supervision Details for Bed Mobility Assistance: A to lift legs into bed.  Educated on safe technique for transfers sit <> stand to/from bed with walker  Transfers Transfers: Sit to Stand;Stand to Sit Sit to Stand: 4: Min guard;With upper extremity assist;From bed;From chair/3-in-1 Stand to Sit: 4: Min guard;With upper extremity assist;To bed;To chair/3-in-1 Details for Transfer Assistance: Cues for hand placement.     Exercise     Balance     End of Session OT - End of Session Equipment Utilized During Treatment: Gait belt;Rolling walker Activity Tolerance: Patient tolerated treatment well Patient left: in chair;with call bell/phone within reach;with family/visitor present  GO     Earlie Raveling OTR/L [phone] 03/02/2013, 6:09 PM

## 2013-03-02 NOTE — Plan of Care (Signed)
Problem: Phase I Progression Outcomes Goal: Initial discharge plan identified Outcome: Progressing Per daughter, pt to go to facility for rehab  Problem: Phase II Progression Outcomes Goal: CBGs stable on SQ insulin Outcome: Progressing CBGs between 200-350 on subq insulin Goal: Progress activity as tolerated unless otherwise ordered Outcome: Completed/Met Date Met:  03/02/13 Pt OOB to bathroom and chair with asstx1

## 2013-03-02 NOTE — Progress Notes (Signed)
Pt and daughter educated on insulin shots. Pt attempted to give herself insulin shot, pt states she cannot see the needle/where to stick. Pt unable to give herself insulin shot this PM. Baron Hamper, RN

## 2013-03-02 NOTE — Progress Notes (Signed)
Pt states she does not feel like she can give herself insulin shots. Pt daughter states she can not give shots either, daughter states that pt may not remember to take insulin at home. Pt educated on importance of taking insulin at home. Pt and daughter verbalized understanding and stated that they will try to give lantis tonight. Will educate pt and daughter on proper technique for administering insulin. Baron Hamper, RN

## 2013-03-02 NOTE — Progress Notes (Signed)
Agree with the above assessment and plan. INR at upper end of therapeutic. Patient takes 5mg  daily pta, will give reduced dose of 3mg  tonight as INR is trending upward. Recheck INR in am.  Sheppard Coil PharmD., BCPS Clinical Pharmacist Pager [phone] 03/02/2013 1:48 PM

## 2013-03-02 NOTE — Plan of Care (Signed)
Problem: Phase I Progression Outcomes Goal: Monitor hydration status Outcome: Completed/Met Date Met:  03/02/13 Pt has +1 edema to legs, MD ordered to stop Fluids.  Goal: Nausea/vomiting controlled with antiemetics Outcome: Completed/Met Date Met:  03/02/13 Pt with no complaints of nausea and vomiting Goal: Pain controlled with appropriate interventions Outcome: Completed/Met Date Met:  03/02/13 Pt with no complaints of pain Goal: OOB as tolerated unless otherwise ordered Outcome: Completed/Met Date Met:  03/02/13 Pt OOB to chair with 1 asst Goal: Voiding-avoid urinary catheter unless indicated Outcome: Not Met (add Reason) Pt has suprapubic catheter from home

## 2013-03-03 DIAGNOSIS — E1101 Type 2 diabetes mellitus with hyperosmolarity with coma: Secondary | ICD-10-CM

## 2013-03-03 LAB — GLUCOSE, CAPILLARY
Glucose-Capillary: 302 mg/dL — ABNORMAL HIGH (ref 70–99)
Glucose-Capillary: 209 mg/dL — ABNORMAL HIGH (ref 70–99)
Glucose-Capillary: 303 mg/dL — ABNORMAL HIGH (ref 70–99)

## 2013-03-03 LAB — PROTIME-INR
INR: 3.46 — ABNORMAL HIGH (ref 0.00–1.49)
Prothrombin Time: 33.5 seconds — ABNORMAL HIGH (ref 11.6–15.2)

## 2013-03-03 MED ORDER — WARFARIN SODIUM 1 MG PO TABS
1.0000 mg | ORAL_TABLET | Freq: Once | ORAL | Status: DC
  Filled 2013-03-03: qty 1

## 2013-03-03 MED ORDER — INSULIN GLARGINE 100 UNIT/ML ~~LOC~~ SOLN
10.0000 [IU] | Freq: Once | SUBCUTANEOUS | Status: AC
  Administered 2013-03-03: 11:00:00 10 [IU] via SUBCUTANEOUS
  Filled 2013-03-03 (×2): qty 0.1

## 2013-03-03 MED ORDER — INSULIN GLARGINE 100 UNIT/ML ~~LOC~~ SOLN: 35.0000 [IU] | Freq: Every day | SUBCUTANEOUS | Status: DC

## 2013-03-03 MED ORDER — INSULIN GLARGINE 100 UNIT/ML ~~LOC~~ SOLN
35.0000 [IU] | Freq: Every day | SUBCUTANEOUS | Status: DC
  Filled 2013-03-03: qty 0.35

## 2013-03-03 MED ORDER — INFLUENZA VAC SPLIT QUAD 0.5 ML IM SUSP
0.5000 mL | INTRAMUSCULAR | Status: AC
  Administered 2013-03-03: 0.5 mL via INTRAMUSCULAR
  Filled 2013-03-03: qty 0.5

## 2013-03-03 MED ORDER — INSULIN GLARGINE 100 UNIT/ML ~~LOC~~ SOLN: 45.0000 [IU] | Freq: Every day | SUBCUTANEOUS | Status: DC

## 2013-03-03 MED ORDER — INSULIN GLARGINE 100 UNIT/ML ~~LOC~~ SOLN: 40.0000 [IU] | Freq: Every day | SUBCUTANEOUS | Status: DC

## 2013-03-03 NOTE — Care Management Note (Addendum)
  Page 2 of 2   03/03/2013     1:51:12 PM   CARE MANAGEMENT NOTE 03/03/2013  Patient:  Krystal [name], Krystal [name]   Account Number:  [id number]  Date Initiated:  03/01/2013  Documentation initiated by:  Oletta Cohn  Subjective/Objective Assessment:   79 y.o. female  history of diabetes mellitus type 2, atrial fibrillation and PE on Coumadin, chronic lower extremity edema, hypertension admitted with CBG >700/ home alone has HH aid ([facility])     Action/Plan:   IV insulin/return hm with self care   Anticipated DC Date:  03/03/2013   Anticipated DC Plan:  HOME W HOME HEALTH SERVICES      DC Planning Services  CM consult      [facility] Choice  Resumption Of Svcs/PTA Provider   Choice offered to / List presented to:          [facility] arranged  HH-1 RN  HH-10 DISEASE MANAGEMENT      HH agency  [facility]   Status of service:  Completed, signed off Medicare Important Message given?   (If response is "NO", the following Medicare IM given date fields will be blank) Date Medicare IM given:   Date Additional Medicare IM given:    Discharge Disposition:    Per UR Regulation:    If discussed at Long Length of Stay Meetings, dates discussed:    Comments:  03/03/13 Oletta Cohn, RN, BSN, NCM [phone] Spoke with pt at bedside regarding discharge planning for Home Health Services. Offered pt list of home health agencies to choose from.  [facility] chose [facility]  to render services. Hilda Lias of [facility] notified.  No DME needs identified at this time.   03/01/13 Oletta Cohn, RN, BSN, [place] [phone]   Contact Information   Name                        Relation Home               Work                             Mobile   [place]       Daughter [phone]  [phone]                [phone]   Jeannine Kitten            Daughter [phone]   [phone]

## 2013-03-03 NOTE — Progress Notes (Signed)
Pt being d/c to home with home health, daughter educated on how to administer insulin to pt, d/c instructions given to pt and daughter, medications and follow ups reviewed with pt, pt and daughter verbalized understanding, pt leaving via wheelchair, pt stable

## 2013-03-03 NOTE — Progress Notes (Addendum)
Physical Therapy Treatment Patient Details Name: Krystal [name] MRN: [id number] DOB: April 11, 1925 Today's Date: 03/03/2013 Time: 4098-1191 PT Time Calculation (min): 24 min  PT Assessment / Plan / Recommendation  History of Present Illness Krystal [name] is a 79 y.o. female with known history of diabetes mellitus type 2, atrial fibrillation and PE on Coumadin, chronic lower extremity edema, hypertension had gone for routine follow with her PCP yesterday for checking INR went patient collection was found to be more than 700 and was referred to the ER   PT Comments   Pt admitted with above. Pt currently with functional limitations due to the continued balance and endurance deficits.  Pt will benefit from skilled PT to increase their independence and safety with mobility to allow discharge to the venue listed below.   Follow Up Recommendations  SNF;Supervision for mobility/OOB                 Equipment Recommendations  None recommended by PT    Recommendations for Other Services OT consult  Frequency Min 3X/week   Progress towards PT Goals Progress towards PT goals: Progressing toward goals  Plan Current plan remains appropriate    Precautions / Restrictions Precautions Precautions: Fall Restrictions Weight Bearing Restrictions: No   Pertinent Vitals/Pain VSS, No pain    Mobility  Bed Mobility Bed Mobility: Not assessed Transfers Transfers: Sit to Stand;Stand to Sit Sit to Stand: 4: Min guard;With upper extremity assist;From bed;From chair/3-in-1 Stand to Sit: 4: Min guard;With upper extremity assist;To bed;To chair/3-in-1 Details for Transfer Assistance: Cues for hand placement. Ambulation/Gait Ambulation/Gait Assistance: 4: Min guard Ambulation Distance (Feet): 165 Feet Assistive device: Rolling walker Ambulation/Gait Assistance Details: Pt needed cues for posture, position in RW and for safety.   Gait Pattern: Trunk flexed;Shuffle Gait velocity: decreased Stairs:  No Wheelchair Mobility Wheelchair Mobility: No   Exercises:  Ankle pumps 10 reps, LAQ 10 reps, Hip flexion 10 reps.      PT Goals (current goals can now be found in the care plan section)    Visit Information  Last PT Received On: 03/03/13 Assistance Needed: +1 History of Present Illness: Krystal [name] is a 79 y.o. female with known history of diabetes mellitus type 2, atrial fibrillation and PE on Coumadin, chronic lower extremity edema, hypertension had gone for routine follow with her PCP yesterday for checking INR went patient collection was found to be more than 700 and was referred to the ER    Subjective Data  Subjective: "I feel better."   Cognition  Cognition Arousal/Alertness: Awake/alert Behavior During Therapy: WFL for tasks assessed/performed Overall Cognitive Status: History of cognitive impairments - at baseline Area of Impairment: Orientation;Safety/judgement Orientation Level: Time Memory: Decreased recall of precautions;Decreased short-term memory Safety/Judgement: Decreased awareness of safety    Balance  Static Sitting Balance Static Sitting - Balance Support: Feet supported;No upper extremity supported Static Sitting - Level of Assistance: 5: Stand by assistance Static Sitting - Comment/# of Minutes: 3 Static Standing Balance Static Standing - Balance Support: Bilateral upper extremity supported;During functional activity Static Standing - Level of Assistance: 5: Stand by assistance Static Standing - Comment/# of Minutes: 2  End of Session PT - End of Session Equipment Utilized During Treatment: Gait belt Activity Tolerance: Patient limited by fatigue Patient left: in chair;with call bell/phone within reach;with family/visitor present;with nursing/sitter in room Nurse Communication: Mobility status       INGOLD,Quang Thorpe 03/03/2013, 2:34 PM  [facility] Acute Rehabilitation [phone] (pager)

## 2013-03-03 NOTE — Progress Notes (Signed)
ANTICOAGULATION CONSULT NOTE - Follow up Consult  Pharmacy Consult for Coumadin Indication: atrial fibrillation   Allergies  Allergen Reactions  . Latex Hives  . Detrol [Tolterodine Tartrate] Hives  . Humibid La [Guaifenesin] Other (See Comments)    UNKNOWN  . Keflex [Cephalexin]     hives  . Vioxx [Rofecoxib] Hives    Patient Measurements: Height: 5\' 1"  (154.9 cm) Weight: 203 lb 0.7 oz (92.1 kg) IBW/kg (Calculated) : 47.8  Vital Signs: Temp: 97 F (36.1 C) (10/16 1419) Temp src: Oral (10/16 1419) BP: 122/67 mmHg (10/16 1419) Pulse Rate: 77 (10/16 1419)  Labs:  Recent Labs  02/28/13 1916  02/28/13 2030 03/01/13 0030 03/01/13 0456 03/02/13 0540 03/03/13 0605  HGB 10.9*  --   --   --  9.5*  --   --   HCT 31.0*  --   --   --  27.6*  --   --   PLT 278  --   --   --  236  --   --   LABPROT  --   < > 25.1*  --  27.6* 30.2* 33.5*  INR  --   < > 2.37*  --  2.68* 3.02* 3.46*  CREATININE 1.68*  --   --  1.48* 1.47* 1.54*  --   TROPONINI  --   --  <0.30  --   --   --   --   < > = values in this interval not displayed.  Estimated Creatinine Clearance: 27.1 ml/min (by C-G formula based on Cr of 1.54).  Assessment: 79yo female on coumadin for Afib and h/o DVT/PE. INR up a some to 3.46 after 5-5-2.5 mg doses.  Home dose is 5 mg daily.  No signs of bleeding noted.   Goal of Therapy:  INR 2-3   Plan:  1. Coumadin 1 mg po x 1 dose today 2. Daily INR Herby Abraham, Pharm.D. [phone] 03/03/2013 2:39 PM

## 2013-03-03 NOTE — Progress Notes (Signed)
Pt c/o HA PRN ultram given, pt stated a few hours later her head still hurt BP 117/31 CBG 303, PRN tylenol given, will continue to monitor

## 2013-03-03 NOTE — Discharge Summary (Signed)
Physician Discharge Summary  Krystal [name]:[id number] DOB: [birth year] DOA: 02/28/2013  PCP: Mickie Hillier, MD  Admit date: 02/28/2013 Discharge date: 03/03/2013  Time spent: 35 minutes  Recommendations for Outpatient Follow-up:  1. Follow up with PCP in 2 week 2. Pt for home. 3. Home health  Discharge Diagnoses:  Active Problems:   Diabetes mellitus type 2, uncontrolled   Lower extremity edema   Atrial fibrillation   History of pulmonary embolism   ARF (acute renal failure)   HHNC (hyperglycemic hyperosmolar nonketotic coma)   Discharge Condition: stable  Diet recommendation: carb modified  Filed Weights   03/01/13 0551 03/02/13 0607 03/03/13 0550  Weight: 90.4 kg (199 lb 4.7 oz) 92.4 kg (203 lb 11.3 oz) 92.1 kg (203 lb 0.7 oz)    History of present illness:  79 y.o. female with known history of diabetes mellitus type 2, atrial fibrillation and PE on Coumadin, chronic lower extremity edema, hypertension had gone for routine follow with her PCP yesterday for checking INR went patient collection was found to be more than 700 and was referred to the ER. In the ER patient's blood sugar was found to be around 790 and IV insulin infusion was started. VBG shows pH of 7.29 but patient's anion gap is only 14. Repeat metabolic panel and ketones were ordered. Patient has been admitted for further management. On exam patient has taken mouth of both lower extremities. On discussion with patient's daughter, patient daughter feels that the erythema may have been more than usual. Patient otherwise did not have any fever chills shortness of breath productive cough nausea vomiting abdominal pain or diarrhea.   Hospital Course:  Diabetes mellitus type 2, uncontrolled/HHNS:  - Started on insulin drip on admission.  - Transition to long acting. Plus SSI.  - d/c amaryl, cont tradjenta.  - CBG's ACHS at home follow up with pcp might need long acting insulin.  Lower extremity edema due to  chronic venous insufficiency:  - redness and hypertrophic skin not new.  - afebrile, no leukocytosis.  Atrial fibrillation:  - rate controlled.  - INR thx.   History of pulmonary embolism  - on coumadin.   CKD stage III:  - Cr at baseline.   Procedures:  none (i.e. Studies not automatically included, echos, thoracentesis, etc; not x-rays)  Consultations:  none  Discharge Exam: Filed Vitals:   03/03/13 1419  BP: 122/67  Pulse: 77  Temp: 97 F (36.1 C)  Resp: 18    General: A&O x3 Cardiovascular: RRR Respiratory: good air movement CTA B/L  Discharge Instructions      Discharge Orders   Future Orders Complete By Expires   Diet - low sodium heart healthy  As directed    Face-to-face encounter (required for Medicare/Medicaid patients)  As directed    Comments:     I David Stall, ABRAHAM certify that this patient is under my care and that I, or a nurse practitioner or physician's assistant working with me, had a face-to-face encounter that meets the physician face-to-face encounter requirements with this patient on 03/03/2013. The encounter with the patient was in whole, or in part for the following medical condition(s) which is the primary reason for home health care (List medical condition): deconditioning   Questions:     The encounter with the patient was in whole, or in part, for the following medical condition, which is the primary reason for home health care:  decondition   I certify that, based on my findings, the  following services are medically necessary home health services:  Nursing   Physical therapy   My clinical findings support the need for the above services:  Unsafe ambulation due to balance issues   Further, I certify that my clinical findings support that this patient is homebound due to:  Pain interferes with ambulation/mobility   Reason for Medically Necessary Home Health Services:  Therapy- Investment banker, operational, Patent examiner    Home Health  As directed    Questions:     To provide the following care/treatments:  PT   OT   RN   Home Health Aide   Increase activity slowly  As directed        Medication List    STOP taking these medications       glimepiride 4 MG tablet  Commonly known as:  AMARYL      TAKE these medications       ALPRAZolam 0.25 MG tablet  Commonly known as:  XANAX  Take 0.125-0.25 mg by mouth 2 (two) times daily as needed for sleep or anxiety (0.5-1 tablet).     cetirizine 10 MG chewable tablet  Commonly known as:  ZYRTEC  Chew 10 mg by mouth daily.     diltiazem 120 MG tablet  Commonly known as:  CARDIZEM  Take 120 mg by mouth 2 (two) times daily.     docusate sodium 100 MG capsule  Commonly known as:  COLACE  Take 100 mg by mouth 2 (two) times daily as needed for constipation.     ferrous sulfate 325 (65 FE) MG tablet  Take 325 mg by mouth at bedtime.     Fluticasone-Salmeterol 250-50 MCG/DOSE Aepb  Commonly known as:  ADVAIR  Inhale 1 puff into the lungs every 12 (twelve) hours.     furosemide 40 MG tablet  Commonly known as:  LASIX  Take 60 mg by mouth daily. Take 1.5 tablets     insulin glargine 100 UNIT/ML injection  Commonly known as:  LANTUS  Inject 0.45 mLs (45 Units total) into the skin at bedtime.     lidocaine 5 %  Commonly known as:  LIDODERM  Place 1 patch onto the skin daily as needed. Remove & Discard patch within 12 hours or as directed by MD     losartan 100 MG tablet  Commonly known as:  COZAAR  Take 100 mg by mouth daily.     metoCLOPramide 5 MG tablet  Commonly known as:  REGLAN  Take 2.5 mg by mouth 4 (four) times daily. 1/2 tablet 4 times daily     mirtazapine 15 MG tablet  Commonly known as:  REMERON  Take 15 mg by mouth at bedtime.     omeprazole 40 MG capsule  Commonly known as:  PRILOSEC  Take 40 mg by mouth daily.     oxybutynin 5 MG 24 hr tablet  Commonly known as:  DITROPAN-XL  Take 5 mg by mouth daily.     pravastatin  20 MG tablet  Commonly known as:  PRAVACHOL  Take 10 mg by mouth daily.     sitaGLIPtin 50 MG tablet  Commonly known as:  JANUVIA  Take 50 mg by mouth daily.     traMADol 50 MG tablet  Commonly known as:  ULTRAM  Take 50 mg by mouth every 6 (six) hours as needed. Pain     vitamin B-12 1000 MCG tablet  Commonly known as:  CYANOCOBALAMIN  Take 1,000 mcg by  mouth daily.     warfarin 5 MG tablet  Commonly known as:  COUMADIN  Take 5 mg by mouth daily.       Allergies  Allergen Reactions  . Latex Hives  . Detrol [Tolterodine Tartrate] Hives  . Humibid La [Guaifenesin] Other (See Comments)    UNKNOWN  . Keflex [Cephalexin]     hives  . Vioxx [Rofecoxib] Hives   Follow-up Information   Follow up with Mickie Hillier, MD In 2 weeks.   Specialty:  Family Medicine   Contact information:   [address] [place] [address] [phone]       Follow up with [facility]. (RN)    Contact information:   [address] [place] [address] [phone]        The results of significant diagnostics from this hospitalization (including imaging, microbiology, ancillary and laboratory) are listed below for reference.    Significant Diagnostic Studies: Dg Chest 2 View  02/28/2013   CLINICAL DATA:  Chest pain with shortness of breath.  EXAM: CHEST  2 VIEW  COMPARISON:  [facility] radiograph 07/06/2012.  FINDINGS: Low lung volumes. Cardiomegaly. Calcified tortuous aorta. Left lower chest lipoma appears stable. No infiltrates or failure. Osseous demineralization. No pneumothorax. Similar appearance to priors except for low lung volumes.  IMPRESSION: No active cardiopulmonary disease.   Electronically Signed   By: Davonna Belling M.D.   On: 02/28/2013 22:28    Microbiology: Recent Results (from the past 240 hour(s))  URINE CULTURE     Status: None   Collection Time    03/01/13  3:34 PM      Result Value Range Status   Specimen  Description URINE, CATHETERIZED   Final   Special Requests NONE   Final   Culture  Setup Time     Final   Value: 03/01/2013 15:48     Performed at [facility] Count     Final   Value: >=100,000 COLONIES/ML     Performed at [facility]   Culture     Final   Value: SERRATIA MARCESCENS     Performed at [facility]   Report Status 03/02/2013 FINAL   Final   Organism ID, Bacteria SERRATIA MARCESCENS   Final     Labs: Basic Metabolic Panel:  Recent Labs Lab 02/28/13 1916 03/01/13 0030 03/01/13 0456 03/02/13 0540  NA 128* 136 134* 134*  K 4.4 3.9 3.6 3.8  CL 92* 101 101 102  CO2 22 22 23 20   GLUCOSE 790* 282* 261* 265*  BUN 31* 28* 26* 26*  CREATININE 1.68* 1.48* 1.47* 1.54*  CALCIUM 8.2* 8.2* 8.1* 7.9*   Liver Function Tests:  Recent Labs Lab 02/28/13 1916  AST 9  ALT 7  ALKPHOS 171*  BILITOT 0.2*  PROT 6.7  ALBUMIN 3.2*   No results found for this basename: LIPASE, AMYLASE,  in the last 168 hours No results found for this basename: AMMONIA,  in the last 168 hours CBC:  Recent Labs Lab 02/28/13 1916 03/01/13 0456  WBC 10.4 9.1  HGB 10.9* 9.5*  HCT 31.0* 27.6*  MCV 80.5 79.1  PLT 278 236   Cardiac Enzymes:  Recent Labs Lab 02/28/13 2030  TROPONINI <0.30   BNP: BNP (last 3 results)  Recent Labs  03/25/12 1040 07/12/12 1228 02/28/13 2030  PROBNP 287.5 62.0 478.4*   CBG:  Recent Labs Lab 03/02/13 1614 03/02/13 2044 03/03/13 0548  03/03/13 1126 03/03/13 1344  GLUCAP 295* 331* 302* 209* 303*       Signed:  Marinda Elk  [facility] 03/03/2013, 3:32 PM

## 2013-03-03 NOTE — Progress Notes (Signed)
Spoke with daughter, Bonita Quin via telephone regarding insulin injections for her mom.  Bonita Quin states that she knows how to give them, as she gives them to her husband.  Would like other sister to learn how to inject as well.  No other concerns stated at this time.

## 2013-03-03 NOTE — Progress Notes (Signed)
[facility]  Patient Status: Active (receiving services up to time of hospitalization)  [facility] is providing the following services: RN.  Per MD order, PT, OT and HHA are added at discharge.  If patient discharges after hours, please call [phone].   Wynelle Bourgeois 03/03/2013, 3:14 PM

## 2013-08-15 ENCOUNTER — Encounter (HOSPITAL_COMMUNITY): Payer: Self-pay | Admitting: Emergency Medicine

## 2013-08-15 ENCOUNTER — Emergency Department (HOSPITAL_COMMUNITY): Payer: Medicare Other

## 2013-08-15 ENCOUNTER — Inpatient Hospital Stay (HOSPITAL_COMMUNITY)
Admission: EM | Admit: 2013-08-15 | Discharge: 2013-08-18 | DRG: 698 | Disposition: A | Discharge status: Home / Self Care | Payer: Medicare Other | Attending: Internal Medicine | Admitting: Internal Medicine

## 2013-08-15 DIAGNOSIS — T83511A Infection and inflammatory reaction due to indwelling urethral catheter, initial encounter: Principal | ICD-10-CM | POA: Diagnosis present

## 2013-08-15 DIAGNOSIS — Z6841 Body Mass Index (BMI) 40.0 and over, adult: Secondary | ICD-10-CM

## 2013-08-15 DIAGNOSIS — E1165 Type 2 diabetes mellitus with hyperglycemia: Secondary | ICD-10-CM

## 2013-08-15 DIAGNOSIS — E785 Hyperlipidemia, unspecified: Secondary | ICD-10-CM | POA: Diagnosis present

## 2013-08-15 DIAGNOSIS — Z7901 Long term (current) use of anticoagulants: Secondary | ICD-10-CM

## 2013-08-15 DIAGNOSIS — IMO0001 Reserved for inherently not codable concepts without codable children: Secondary | ICD-10-CM | POA: Diagnosis present

## 2013-08-15 DIAGNOSIS — K5900 Constipation, unspecified: Secondary | ICD-10-CM | POA: Diagnosis present

## 2013-08-15 DIAGNOSIS — R339 Retention of urine, unspecified: Secondary | ICD-10-CM | POA: Diagnosis present

## 2013-08-15 DIAGNOSIS — I10 Essential (primary) hypertension: Secondary | ICD-10-CM | POA: Diagnosis present

## 2013-08-15 DIAGNOSIS — R600 Localized edema: Secondary | ICD-10-CM | POA: Diagnosis present

## 2013-08-15 DIAGNOSIS — Z86711 Personal history of pulmonary embolism: Secondary | ICD-10-CM | POA: Diagnosis present

## 2013-08-15 DIAGNOSIS — A4152 Sepsis due to Pseudomonas: Secondary | ICD-10-CM | POA: Diagnosis present

## 2013-08-15 DIAGNOSIS — Z91040 Latex allergy status: Secondary | ICD-10-CM

## 2013-08-15 DIAGNOSIS — I4891 Unspecified atrial fibrillation: Secondary | ICD-10-CM | POA: Diagnosis present

## 2013-08-15 DIAGNOSIS — K219 Gastro-esophageal reflux disease without esophagitis: Secondary | ICD-10-CM | POA: Diagnosis present

## 2013-08-15 DIAGNOSIS — Z7982 Long term (current) use of aspirin: Secondary | ICD-10-CM

## 2013-08-15 DIAGNOSIS — Z794 Long term (current) use of insulin: Secondary | ICD-10-CM

## 2013-08-15 DIAGNOSIS — K469 Unspecified abdominal hernia without obstruction or gangrene: Secondary | ICD-10-CM | POA: Diagnosis present

## 2013-08-15 DIAGNOSIS — M1990 Unspecified osteoarthritis, unspecified site: Secondary | ICD-10-CM | POA: Diagnosis present

## 2013-08-15 DIAGNOSIS — F17200 Nicotine dependence, unspecified, uncomplicated: Secondary | ICD-10-CM | POA: Diagnosis present

## 2013-08-15 DIAGNOSIS — E0590 Thyrotoxicosis, unspecified without thyrotoxic crisis or storm: Secondary | ICD-10-CM | POA: Diagnosis present

## 2013-08-15 DIAGNOSIS — Y846 Urinary catheterization as the cause of abnormal reaction of the patient, or of later complication, without mention of misadventure at the time of the procedure: Secondary | ICD-10-CM | POA: Diagnosis present

## 2013-08-15 DIAGNOSIS — H9190 Unspecified hearing loss, unspecified ear: Secondary | ICD-10-CM | POA: Diagnosis present

## 2013-08-15 DIAGNOSIS — N390 Urinary tract infection, site not specified: Secondary | ICD-10-CM | POA: Diagnosis present

## 2013-08-15 DIAGNOSIS — N179 Acute kidney failure, unspecified: Secondary | ICD-10-CM | POA: Diagnosis present

## 2013-08-15 DIAGNOSIS — A419 Sepsis, unspecified organism: Secondary | ICD-10-CM | POA: Diagnosis present

## 2013-08-15 DIAGNOSIS — IMO0002 Reserved for concepts with insufficient information to code with codable children: Secondary | ICD-10-CM | POA: Diagnosis present

## 2013-08-15 DIAGNOSIS — Y92009 Unspecified place in unspecified non-institutional (private) residence as the place of occurrence of the external cause: Secondary | ICD-10-CM

## 2013-08-15 DIAGNOSIS — Z86718 Personal history of other venous thrombosis and embolism: Secondary | ICD-10-CM

## 2013-08-15 DIAGNOSIS — N3289 Other specified disorders of bladder: Secondary | ICD-10-CM | POA: Diagnosis present

## 2013-08-15 DIAGNOSIS — Z66 Do not resuscitate: Secondary | ICD-10-CM | POA: Diagnosis not present

## 2013-08-15 DIAGNOSIS — R609 Edema, unspecified: Secondary | ICD-10-CM

## 2013-08-15 DIAGNOSIS — I2510 Atherosclerotic heart disease of native coronary artery without angina pectoris: Secondary | ICD-10-CM | POA: Diagnosis present

## 2013-08-15 LAB — COMPREHENSIVE METABOLIC PANEL
ALBUMIN: 3.4 g/dL — AB (ref 3.5–5.2)
ALT: 8 U/L (ref 0–35)
AST: 13 U/L (ref 0–37)
Alkaline Phosphatase: 128 U/L — ABNORMAL HIGH (ref 39–117)
BUN: 64 mg/dL — AB (ref 6–23)
CALCIUM: 9.1 mg/dL (ref 8.4–10.5)
CO2: 24 mEq/L (ref 19–32)
CREATININE: 2.81 mg/dL — AB (ref 0.50–1.10)
Chloride: 97 mEq/L (ref 96–112)
GFR calc Af Amer: 16 mL/min — ABNORMAL LOW (ref >90)
GFR calc non Af Amer: 14 mL/min — ABNORMAL LOW (ref >90)
Glucose, Bld: 198 mg/dL — ABNORMAL HIGH (ref 70–99)
Potassium: 5 mEq/L (ref 3.7–5.3)
Sodium: 138 mEq/L (ref 137–147)
TOTAL PROTEIN: 7.6 g/dL (ref 6.0–8.3)
Total Bilirubin: 0.3 mg/dL (ref 0.3–1.2)

## 2013-08-15 LAB — CREATININE, SERUM
CREATININE: 2.83 mg/dL — AB (ref 0.50–1.10)
GFR calc Af Amer: 16 mL/min — ABNORMAL LOW (ref >90)
GFR calc non Af Amer: 14 mL/min — ABNORMAL LOW (ref >90)

## 2013-08-15 LAB — URINALYSIS, ROUTINE W REFLEX MICROSCOPIC
Bilirubin Urine: NEGATIVE
Glucose, UA: NEGATIVE mg/dL
Ketones, ur: NEGATIVE mg/dL
NITRITE: POSITIVE — AB
Protein, ur: 30 mg/dL — AB
SPECIFIC GRAVITY, URINE: 1.014 (ref 1.005–1.030)
Urobilinogen, UA: 0.2 mg/dL (ref 0.0–1.0)
pH: 6.5 (ref 5.0–8.0)

## 2013-08-15 LAB — CBC WITH DIFFERENTIAL/PLATELET
BASOS PCT: 0 % (ref 0–1)
Basophils Absolute: 0 10*3/uL (ref 0.0–0.1)
EOS ABS: 0.3 10*3/uL (ref 0.0–0.7)
Eosinophils Relative: 2 % (ref 0–5)
HEMATOCRIT: 33.5 % — AB (ref 36.0–46.0)
HEMOGLOBIN: 10.9 g/dL — AB (ref 12.0–15.0)
Lymphocytes Relative: 11 % — ABNORMAL LOW (ref 12–46)
Lymphs Abs: 1.7 10*3/uL (ref 0.7–4.0)
MCH: 27.1 pg (ref 26.0–34.0)
MCHC: 32.5 g/dL (ref 30.0–36.0)
MCV: 83.3 fL (ref 78.0–100.0)
MONO ABS: 1.3 10*3/uL — AB (ref 0.1–1.0)
MONOS PCT: 8 % (ref 3–12)
NEUTROS PCT: 79 % — AB (ref 43–77)
Neutro Abs: 13.1 10*3/uL — ABNORMAL HIGH (ref 1.7–7.7)
Platelets: 370 10*3/uL (ref 150–400)
RBC: 4.02 MIL/uL (ref 3.87–5.11)
RDW: 14.7 % (ref 11.5–15.5)
WBC: 16.4 10*3/uL — ABNORMAL HIGH (ref 4.0–10.5)

## 2013-08-15 LAB — URINE MICROSCOPIC-ADD ON

## 2013-08-15 LAB — CBC
HEMATOCRIT: 30.8 % — AB (ref 36.0–46.0)
HEMOGLOBIN: 10.1 g/dL — AB (ref 12.0–15.0)
MCH: 27.1 pg (ref 26.0–34.0)
MCHC: 32.8 g/dL (ref 30.0–36.0)
MCV: 82.6 fL (ref 78.0–100.0)
Platelets: 318 10*3/uL (ref 150–400)
RBC: 3.73 MIL/uL — ABNORMAL LOW (ref 3.87–5.11)
RDW: 14.7 % (ref 11.5–15.5)
WBC: 11.7 10*3/uL — ABNORMAL HIGH (ref 4.0–10.5)

## 2013-08-15 LAB — LIPID PANEL
CHOL/HDL RATIO: 2.9 ratio
Cholesterol: 112 mg/dL (ref 0–200)
HDL: 39 mg/dL — ABNORMAL LOW (ref >39)
LDL Cholesterol: 57 mg/dL (ref 0–99)
Triglycerides: 81 mg/dL (ref <150)
VLDL: 16 mg/dL (ref 0–40)

## 2013-08-15 LAB — PRO B NATRIURETIC PEPTIDE: PRO B NATRI PEPTIDE: 284.1 pg/mL (ref 0–450)

## 2013-08-15 LAB — PROTIME-INR
INR: 3.02 — AB (ref 0.00–1.49)
Prothrombin Time: 30.2 seconds — ABNORMAL HIGH (ref 11.6–15.2)

## 2013-08-15 LAB — TROPONIN I: Troponin I: <0.3 ng/mL (ref <0.30)

## 2013-08-15 LAB — APTT: APTT: 44 s — AB (ref 24–37)

## 2013-08-15 LAB — LIPASE, BLOOD: LIPASE: 18 U/L (ref 11–59)

## 2013-08-15 LAB — GLUCOSE, CAPILLARY: Glucose-Capillary: 145 mg/dL — ABNORMAL HIGH (ref 70–99)

## 2013-08-15 LAB — LACTIC ACID, PLASMA: Lactic Acid, Venous: 1 mmol/L (ref 0.5–2.2)

## 2013-08-15 MED ORDER — ACETAMINOPHEN 325 MG PO TABS
650.0000 mg | ORAL_TABLET | Freq: Four times a day (QID) | ORAL | Status: DC | PRN
Start: 2013-08-15 — End: 2013-08-18
  Administered 2013-08-16 – 2013-08-17 (×2): 650 mg via ORAL
  Filled 2013-08-15 (×2): qty 2

## 2013-08-15 MED ORDER — MAGNESIUM CITRATE PO SOLN
0.5000 | Freq: Once | ORAL | Status: AC
  Administered 2013-08-16: 0.5 via ORAL
  Filled 2013-08-15: qty 296

## 2013-08-15 MED ORDER — IOHEXOL 300 MG/ML  SOLN
25.0000 mL | INTRAMUSCULAR | Status: AC
  Administered 2013-08-15 (×2): 25 mL via ORAL

## 2013-08-15 MED ORDER — ONDANSETRON HCL 4 MG PO TABS: 4.0000 mg | ORAL_TABLET | Freq: Four times a day (QID) | ORAL | Status: DC | PRN

## 2013-08-15 MED ORDER — DEXTROSE 5 % IV SOLN
500.0000 mg | Freq: Three times a day (TID) | INTRAVENOUS | Status: DC
  Filled 2013-08-15 (×2): qty 0.5

## 2013-08-15 MED ORDER — MORPHINE SULFATE 2 MG/ML IJ SOLN
1.0000 mg | INTRAMUSCULAR | Status: DC | PRN
  Administered 2013-08-15 – 2013-08-18 (×8): 1 mg via INTRAVENOUS
  Filled 2013-08-15 (×9): qty 1

## 2013-08-15 MED ORDER — LEVOFLOXACIN IN D5W 750 MG/150ML IV SOLN
750.0000 mg | Freq: Once | INTRAVENOUS | Status: AC
  Administered 2013-08-15: 750 mg via INTRAVENOUS
  Filled 2013-08-15: qty 150

## 2013-08-15 MED ORDER — DEXTROSE 5 % IV SOLN
1.0000 g | Freq: Once | INTRAVENOUS | Status: AC
  Administered 2013-08-15: 1 g via INTRAVENOUS
  Filled 2013-08-15: qty 1

## 2013-08-15 MED ORDER — LINAGLIPTIN 5 MG PO TABS
5.0000 mg | ORAL_TABLET | Freq: Every day | ORAL | Status: DC
  Administered 2013-08-16: 5 mg via ORAL
  Filled 2013-08-15: qty 1

## 2013-08-15 MED ORDER — METHIMAZOLE 5 MG PO TABS
5.0000 mg | ORAL_TABLET | Freq: Every day | ORAL | Status: DC
  Administered 2013-08-16 – 2013-08-18 (×3): 5 mg via ORAL
  Filled 2013-08-15 (×4): qty 1

## 2013-08-15 MED ORDER — MINERAL OIL RE ENEM
1.0000 | ENEMA | Freq: Once | RECTAL | Status: DC
  Filled 2013-08-15: qty 1

## 2013-08-15 MED ORDER — INSULIN ASPART 100 UNIT/ML ~~LOC~~ SOLN
0.0000 [IU] | SUBCUTANEOUS | Status: DC
  Administered 2013-08-16: 3 [IU] via SUBCUTANEOUS
  Administered 2013-08-16 (×2): 4 [IU] via SUBCUTANEOUS
  Administered 2013-08-16: 3 [IU] via SUBCUTANEOUS
  Administered 2013-08-17: 7 [IU] via SUBCUTANEOUS
  Administered 2013-08-17 (×2): 3 [IU] via SUBCUTANEOUS
  Administered 2013-08-17: 7 [IU] via SUBCUTANEOUS
  Administered 2013-08-17: 4 [IU] via SUBCUTANEOUS
  Administered 2013-08-18: 3 [IU] via SUBCUTANEOUS
  Administered 2013-08-18: 7 [IU] via SUBCUTANEOUS

## 2013-08-15 MED ORDER — MIRABEGRON ER 25 MG PO TB24: 25.0000 mg | ORAL_TABLET | Freq: Every day | ORAL | Status: DC

## 2013-08-15 MED ORDER — ENOXAPARIN SODIUM 40 MG/0.4ML ~~LOC~~ SOLN: 40.0000 mg | SUBCUTANEOUS | Status: DC

## 2013-08-15 MED ORDER — ACETAMINOPHEN 650 MG RE SUPP
650.0000 mg | Freq: Four times a day (QID) | RECTAL | Status: DC | PRN
Start: 2013-08-15 — End: 2013-08-18

## 2013-08-15 MED ORDER — MOMETASONE FURO-FORMOTEROL FUM 100-5 MCG/ACT IN AERO
2.0000 | INHALATION_SPRAY | Freq: Two times a day (BID) | RESPIRATORY_TRACT | Status: DC
  Administered 2013-08-15 – 2013-08-17 (×4): 2 via RESPIRATORY_TRACT
  Filled 2013-08-15: qty 8.8

## 2013-08-15 MED ORDER — MIRTAZAPINE 15 MG PO TABS
15.0000 mg | ORAL_TABLET | Freq: Every day | ORAL | Status: DC
  Administered 2013-08-15 – 2013-08-17 (×3): 15 mg via ORAL
  Filled 2013-08-15 (×4): qty 1

## 2013-08-15 MED ORDER — METOCLOPRAMIDE HCL 5 MG PO TABS
2.5000 mg | ORAL_TABLET | Freq: Four times a day (QID) | ORAL | Status: DC
  Administered 2013-08-15 – 2013-08-18 (×11): 2.5 mg via ORAL
  Filled 2013-08-15 (×13): qty 0.5

## 2013-08-15 MED ORDER — ONDANSETRON HCL 4 MG/2ML IJ SOLN
4.0000 mg | Freq: Four times a day (QID) | INTRAMUSCULAR | Status: DC | PRN
  Administered 2013-08-16: 4 mg via INTRAVENOUS
  Filled 2013-08-15: qty 2

## 2013-08-15 MED ORDER — WARFARIN - PHARMACIST DOSING INPATIENT: Freq: Every day | Status: DC

## 2013-08-15 MED ORDER — OXYBUTYNIN CHLORIDE 5 MG PO TABS
5.0000 mg | ORAL_TABLET | Freq: Three times a day (TID) | ORAL | Status: DC
  Administered 2013-08-15 – 2013-08-16 (×2): 5 mg via ORAL
  Filled 2013-08-15 (×4): qty 1

## 2013-08-15 MED ORDER — FERROUS SULFATE 325 (65 FE) MG PO TABS
325.0000 mg | ORAL_TABLET | Freq: Every day | ORAL | Status: DC
  Administered 2013-08-15 – 2013-08-17 (×3): 325 mg via ORAL
  Filled 2013-08-15 (×4): qty 1

## 2013-08-15 MED ORDER — DILTIAZEM HCL ER 60 MG PO CP12
120.0000 mg | ORAL_CAPSULE | Freq: Two times a day (BID) | ORAL | Status: DC
  Administered 2013-08-15 – 2013-08-18 (×6): 120 mg via ORAL
  Filled 2013-08-15 (×7): qty 2

## 2013-08-15 MED ORDER — DILTIAZEM HCL 60 MG PO TABS: 120.0000 mg | ORAL_TABLET | Freq: Two times a day (BID) | ORAL | Status: DC

## 2013-08-15 MED ORDER — DOCUSATE SODIUM 100 MG PO CAPS
100.0000 mg | ORAL_CAPSULE | Freq: Two times a day (BID) | ORAL | Status: DC
  Administered 2013-08-15 – 2013-08-16 (×2): 100 mg via ORAL
  Filled 2013-08-15 (×3): qty 1

## 2013-08-15 MED ORDER — FUROSEMIDE 20 MG PO TABS: 60.0000 mg | ORAL_TABLET | Freq: Every day | ORAL | Status: DC

## 2013-08-15 MED ORDER — DOCUSATE SODIUM 283 MG RE ENEM
1.0000 | ENEMA | Freq: Once | RECTAL | Status: AC
  Administered 2013-08-15: 283 mg via RECTAL
  Filled 2013-08-15: qty 1

## 2013-08-15 MED ORDER — SIMVASTATIN 10 MG PO TABS
10.0000 mg | ORAL_TABLET | Freq: Every day | ORAL | Status: DC
  Administered 2013-08-16 – 2013-08-17 (×3): 10 mg via ORAL
  Filled 2013-08-15 (×5): qty 1

## 2013-08-15 MED ORDER — OXYBUTYNIN CHLORIDE ER 5 MG PO TB24: 5.0000 mg | ORAL_TABLET | Freq: Three times a day (TID) | ORAL | Status: DC

## 2013-08-15 MED ORDER — VITAMIN B-12 1000 MCG PO TABS
1000.0000 ug | ORAL_TABLET | Freq: Every day | ORAL | Status: DC
  Administered 2013-08-16 – 2013-08-18 (×3): 1000 ug via ORAL
  Filled 2013-08-15 (×3): qty 1

## 2013-08-15 MED ORDER — SODIUM CHLORIDE 0.9 % IV SOLN
INTRAVENOUS | Status: DC
  Administered 2013-08-15: 21:00:00 via INTRAVENOUS
  Administered 2013-08-16 – 2013-08-17 (×2): 75 mL/h via INTRAVENOUS
  Administered 2013-08-18: 07:00:00 via INTRAVENOUS

## 2013-08-15 MED ORDER — DOCUSATE SODIUM 283 MG RE ENEM
1.0000 | ENEMA | Freq: Once | RECTAL | Status: DC
  Filled 2013-08-15: qty 1

## 2013-08-15 MED ORDER — LOSARTAN POTASSIUM 50 MG PO TABS: 100.0000 mg | ORAL_TABLET | Freq: Every day | ORAL | Status: DC

## 2013-08-15 MED ORDER — LEVOFLOXACIN IN D5W 500 MG/100ML IV SOLN
500.0000 mg | INTRAVENOUS | Status: DC
  Filled 2013-08-15: qty 100

## 2013-08-15 NOTE — ED Provider Notes (Signed)
Pt received in sign out from PA [place] at shift change.  80 y.o. With abdominal pain and no BM x 5 days despite supportive treatment.  Suprapubic catheter in place, followed by Dr. Vernie Ammonsttelin-- urine nitrite +, culture pending with AKI present.  Pt started on azactam and levaquin.    Plan: CT pending, admit.  Results for orders placed during the hospital encounter of 08/15/13  CBC WITH DIFFERENTIAL      Result Value Ref Range   WBC 16.4 (*) 4.0 - 10.5 K/uL   RBC 4.02  3.87 - 5.11 MIL/uL   Hemoglobin 10.9 (*) 12.0 - 15.0 g/dL   HCT 16.133.5 (*) 09.636.0 - 04.546.0 %   MCV 83.3  78.0 - 100.0 fL   MCH 27.1  26.0 - 34.0 pg   MCHC 32.5  30.0 - 36.0 g/dL   RDW 40.914.7  81.111.5 - 91.415.5 %   Platelets 370  150 - 400 K/uL   Neutrophils Relative % 79 (*) 43 - 77 %   Neutro Abs 13.1 (*) 1.7 - 7.7 K/uL   Lymphocytes Relative 11 (*) 12 - 46 %   Lymphs Abs 1.7  0.7 - 4.0 K/uL   Monocytes Relative 8  3 - 12 %   Monocytes Absolute 1.3 (*) 0.1 - 1.0 K/uL   Eosinophils Relative 2  0 - 5 %   Eosinophils Absolute 0.3  0.0 - 0.7 K/uL   Basophils Relative 0  0 - 1 %   Basophils Absolute 0.0  0.0 - 0.1 K/uL  COMPREHENSIVE METABOLIC PANEL      Result Value Ref Range   Sodium 138  137 - 147 mEq/L   Potassium 5.0  3.7 - 5.3 mEq/L   Chloride 97  96 - 112 mEq/L   CO2 24  19 - 32 mEq/L   Glucose, Bld 198 (*) 70 - 99 mg/dL   BUN 64 (*) 6 - 23 mg/dL   Creatinine, Ser 7.822.81 (*) 0.50 - 1.10 mg/dL   Calcium 9.1  8.4 - 95.610.5 mg/dL   Total Protein 7.6  6.0 - 8.3 g/dL   Albumin 3.4 (*) 3.5 - 5.2 g/dL   AST 13  0 - 37 U/L   ALT 8  0 - 35 U/L   Alkaline Phosphatase 128 (*) 39 - 117 U/L   Total Bilirubin 0.3  0.3 - 1.2 mg/dL   GFR calc non Af Amer 14 (*) >90 mL/min   GFR calc Af Amer 16 (*) >90 mL/min  LIPASE, BLOOD      Result Value Ref Range   Lipase 18  11 - 59 U/L  URINALYSIS, ROUTINE W REFLEX MICROSCOPIC      Result Value Ref Range   Color, Urine YELLOW  YELLOW   APPearance HAZY (*) CLEAR   Specific Gravity, Urine 1.014   1.005 - 1.030   pH 6.5  5.0 - 8.0   Glucose, UA NEGATIVE  NEGATIVE mg/dL   Hgb urine dipstick LARGE (*) NEGATIVE   Bilirubin Urine NEGATIVE  NEGATIVE   Ketones, ur NEGATIVE  NEGATIVE mg/dL   Protein, ur 30 (*) NEGATIVE mg/dL   Urobilinogen, UA 0.2  0.0 - 1.0 mg/dL   Nitrite POSITIVE (*) NEGATIVE   Leukocytes, UA LARGE (*) NEGATIVE  PROTIME-INR      Result Value Ref Range   Prothrombin Time 30.2 (*) 11.6 - 15.2 seconds   INR 3.02 (*) 0.00 - 1.49  APTT      Result Value Ref Range  aPTT 44 (*) 24 - 37 seconds  URINE MICROSCOPIC-ADD ON      Result Value Ref Range   Squamous Epithelial / LPF MANY (*) RARE   WBC, UA TOO NUMEROUS TO COUNT  <3 WBC/hpf   RBC / HPF 7-10  <3 RBC/hpf   Bacteria, UA MANY (*) RARE  TROPONIN I      Result Value Ref Range   Troponin I <0.30  <0.30 ng/mL   Ct Abdomen Pelvis Wo Contrast  08/15/2013   CLINICAL DATA:  Abdominal pain.  No recent bowel movement.  EXAM: CT ABDOMEN AND PELVIS WITHOUT CONTRAST  TECHNIQUE: Multidetector CT imaging of the abdomen and pelvis was performed following the standard protocol without intravenous contrast.  COMPARISON:  12/30/2012 CT.  FINDINGS: Moderate stool rectosigmoid region. Scattered colonic diverticula without extra luminal bowel inflammatory process, free fluid or free air.  Lateral to the right rectus muscle there is bulge/ mild Spigelian hernia containing fat and small bowel loops. This does not cause obstruction.  Suprapubic catheter in place.  Left adrenal nodule consistent with small adenoma stable.  Post cholecystectomy and hysterectomy.  Taking into account limitation by non contrast imaging, No worrisome hepatic, splenic, renal, right renal or pancreatic lesion.  Atherosclerotic type changes coronary arteries, aorta and aortic branch vessels. No abdominal aortic aneurysm.  Chronic L1 anterior wedge compression deformity.  IMPRESSION: Moderate stool rectosigmoid region. Scattered colonic diverticula without extra luminal  bowel inflammatory process, free fluid or free air.  Please see above.   Electronically Signed   By: Bridgett Larsson M.D.   On: 08/15/2013 16:54   CT with large stool burden, no SBO.  Consulted hospitalist, Dr. Joseph Art who will admit.  VS remain stable.  Garlon Hatchet, PA-C 08/15/13 2353

## 2013-08-15 NOTE — ED Notes (Signed)
Per PA reporting to hold off on mineral oil enema. Will change order to soap suds.

## 2013-08-15 NOTE — H&P (Signed)
[facility] History and Physical  Krystal SmokerLouise P [place] ZOX:[id number] DOB: [birth year] DOA: 08/15/2013  Referring physician:  PCP: Mickie HillierLITTLE,[name], MD  Specialists:   Chief Complaint:   HPI: Krystal SmokerLouise P [name]  80yo WF PMHx hyperthyroidism?, hypertension, GERD, diabetes uncontrolled, hyperlipidemia, coronary artery disease, vertigo presenting to the ED with abdominal pain that is been ongoing for the past several days. As per daughter's report, reported that patient is been having right lower quadrant discomfort without radiation. Reported that patient has not had a bowel movement in the past 5 days. Stated that milk of magnesia, laxatives, increase in hydration have been given to the patient with no relief. Daughter reported that patient had a similar episode approximately 5 years ago where she needs to be admitted to the hospital. Reported that patient was by herself with a home health aid-patient is suprapubic catheter. Denied back pain, chest pain, shortness of breath, difficulty breathing, headache, dizziness, changes to eating, weakness. Disorder denied altered mental status.    Review of Systems: The patient denies anorexia, fever, weight loss,, vision loss, decreased hearing, hoarseness, chest pain, syncope, dyspnea on exertion, peripheral edema, balance deficits, hemoptysis, abdominal pain, melena, hematochezia, severe indigestion/heartburn, hematuria, incontinence, genital sores, muscle weakness, suspicious skin lesions, transient blindness, difficulty walking, depression, unusual weight change, abnormal bleeding, enlarged lymph nodes, angioedema, and breast masses.    TRAVEL HISTORY:   Procedure CT abdomen pelvis without contrast 08/15/2048 -Moderate stool rectosigmoid region.  -Scattered colonic diverticula without extra luminal bowel inflammatory process, free fluid or free  air. -(Hernia) Lateral to the right rectus muscle there is bulge/ mild Spigelian  hernia containing fat and  small bowel loops. This does not cause obstruction.   Antibiotics Aztreonam 3/30>> Levofloxacin 3/30>>   Consultation    Past Medical History  Diagnosis Date  . Hypertension   . GERD (gastroesophageal reflux disease)   . Diabetes mellitus   . History of atrial fibrillation EPISODE YRS AGO  . Urinary retention     s/p suprapubic catheter November 2013  . Coronary artery disease cardiologist- dr Maylon Costennent--  lov 09-12-2010 in epic    non-obstructinve min. cad  . Foley catheter in place   . Edema of lower extremity BILATERAL LEG  . Hyperlipidemia   . History of pulmonary embolus (PE) 2010--  TAKES COUMADIN  . History of DVT of lower extremity   . Anticoagulated on Coumadin   . Generalized weakness AGE RELATED--  USES WALKER  . Vertigo   . OA (osteoarthritis)   . Thin skin FRAGILE  . Hypothyroidism NO MEDS  . Impaired hearing BILATERAL AIDS  . Chronic diarrhea INTERMITTANT   Past Surgical History  Procedure Laterality Date  . Knee surgery    . Tubal ligation    . Abdominal hysterectomy    . Cardiac catheterization  09-27-2004  DR TENNENT    NORMAL LVF/ MILD IRREGULARITIES IN MID LAD W/ NO SIGNIFICANT OBSTRUCTIVE DISEASE  . Abdominal hysterectomy  1994  (APPROX)    W/ BILATERAL SALPINGO-OOPHORECTOMY  . Cholecystectomy  2000  . Knee arthroscopy  1998  (APPROX)  . Bladder suspension  2002  (APPROX)    W/ ANTERIOR AND POSTERIOR REPAIR  . Cataract extraction w/ intraocular lens  implant, bilateral    . Transthoracic echocardiogram  02-23-2009  (DX W/ PE & PAF)    EF 65-70%/ MILD MR/ MODERATE TR/ MILDLY DILATED RA  &  LA/  RV SYSTOLIC FUNCTION MODERATLY REDUCED   . Insertion of suprapubic catheter  04/02/2012  Procedure: INSERTION OF SUPRAPUBIC CATHETER;  Surgeon: Garnett Farm, MD;  Location: [facility];  Service: Urology;  Laterality: N/A;  SUPRAPUBIC TUBE PLACEMENT  . Cystoscopy  04/02/2012    Procedure: CYSTOSCOPY;  Surgeon: Garnett Farm, MD;   Location: [facility];  Service: Urology;;   Social History:  reports that she has never smoked. Her smokeless tobacco use includes Snuff. She reports that she does not drink alcohol or use illicit drugs. where does patient live--home, ALF, SNF? Home alone  Can patient participate in ADLs? Yes  Allergies  Allergen Reactions  . Humibid La [Guaifenesin] Shortness Of Breath  . Latex Hives  . Detrol [Tolterodine Tartrate] Hives  . Keflex [Cephalexin]     hives  . Vioxx [Rofecoxib] Hives    Family History  Problem Relation Age of Onset  . Heart disease Mother   . Hypertension Mother      Prior to Admission medications   Medication Sig Start Date End Date Taking? Authorizing Provider  ALPRAZolam (XANAX) 0.25 MG tablet Take 0.125-0.25 mg by mouth 2 (two) times daily as needed for sleep or anxiety (0.5-1 tablet).    Yes Historical Provider, MD  cetirizine (ZYRTEC) 10 MG chewable tablet Chew 10 mg by mouth daily.   Yes Historical Provider, MD  diltiazem (CARDIZEM) 120 MG tablet Take 120 mg by mouth 2 (two) times daily.   Yes Historical Provider, MD  ferrous sulfate 325 (65 FE) MG tablet Take 325 mg by mouth at bedtime.    Yes Historical Provider, MD  Fluticasone-Salmeterol (ADVAIR) 250-50 MCG/DOSE AEPB Inhale 1 puff into the lungs 2 (two) times daily as needed (shortness of breath).    Yes Historical Provider, MD  furosemide (LASIX) 40 MG tablet Take 60 mg by mouth daily. Take 1.5 tablets 09/27/12  Yes Tonny Bollman, MD  insulin glargine (LANTUS) 100 UNIT/ML injection Inject 0.45 mLs (45 Units total) into the skin at bedtime. 03/03/13  Yes Marinda Elk, MD  losartan (COZAAR) 100 MG tablet Take 100 mg by mouth daily.   Yes Historical Provider, MD  methimazole (TAPAZOLE) 10 MG tablet Take 5 mg by mouth daily.   Yes Historical Provider, MD  metoCLOPramide (REGLAN) 5 MG tablet Take 2.5 mg by mouth 4 (four) times daily. 1/2 tablet 4 times daily   Yes Historical Provider, MD   mirabegron ER (MYRBETRIQ) 25 MG TB24 tablet Take 25 mg by mouth daily.   Yes Historical Provider, MD  mirtazapine (REMERON) 15 MG tablet Take 15 mg by mouth at bedtime.   Yes Historical Provider, MD  oxybutynin (DITROPAN-XL) 5 MG 24 hr tablet Take 5 mg by mouth 3 (three) times daily.    Yes Historical Provider, MD  Polyethyl Glycol-Propyl Glycol (SYSTANE OP) Apply 1 drop to eye daily as needed (dry eye).   Yes Historical Provider, MD  potassium chloride SA (K-DUR,KLOR-CON) 20 MEQ tablet Take 10 mEq by mouth 2 (two) times daily.   Yes Historical Provider, MD  pravastatin (PRAVACHOL) 20 MG tablet Take 20 mg by mouth daily.    Yes Historical Provider, MD  sitaGLIPtin (JANUVIA) 50 MG tablet Take 50 mg by mouth daily.   Yes Historical Provider, MD  vitamin B-12 (CYANOCOBALAMIN) 1000 MCG tablet Take 1,000 mcg by mouth daily.   Yes Historical Provider, MD  warfarin (COUMADIN) 2 MG tablet Take 2-3 mg by mouth daily. 2 mg on sun, tue, thur and 3 mg all other days   Yes Historical Provider, MD  Physical Exam: Filed Vitals:   08/15/13 1829 08/15/13 1844 08/15/13 1845 08/15/13 1904  BP: 121/32 121/32 116/55 118/59  Pulse: 73 67 68 71  Temp:  97.4 F (36.3 C)    TempSrc:  Oral    Resp: 13 14 17 12   Height:      Weight:      SpO2: 98% 97% 97% 96%     General:  Patient alert, mildly confused, follows all commands, complaints of right lower quadrant abdominal pain/inguinal pain  Eyes: He was equal reactive to light and accommodation  Neck: Negative JVD, negative lymphadenopathy  Cardiovascular: Irregular irregular rhythm and rate  Respiratory: Clear to auscultation  Abdomen: Pain to palpation RLQ> RLQ  Musculoskeletal: Pain to palpation right inguinal area, Passive Flexation RLE at Hip Inc pain inguinal area/right lower quadrant abdomen, bilateral pedal edema, bilateral lower extremity erythema (chronic per daughter)   Neurologic: Cranial nerve II through XII intact  Labs on Admission:   Basic Metabolic Panel:  Recent Labs Lab 08/15/13 1205  NA 138  K 5.0  CL 97  CO2 24  GLUCOSE 198*  BUN 64*  CREATININE 2.81*  CALCIUM 9.1   Liver Function Tests:  Recent Labs Lab 08/15/13 1205  AST 13  ALT 8  ALKPHOS 128*  BILITOT 0.3  PROT 7.6  ALBUMIN 3.4*    Recent Labs Lab 08/15/13 1205  LIPASE 18   No results found for this basename: AMMONIA,  in the last 168 hours CBC:  Recent Labs Lab 08/15/13 1205  WBC 16.4*  NEUTROABS 13.1*  HGB 10.9*  HCT 33.5*  MCV 83.3  PLT 370   Cardiac Enzymes:  Recent Labs Lab 08/15/13 1610  TROPONINI <0.30    BNP (last 3 results)  Recent Labs  02/28/13 2030  PROBNP 478.4*   CBG: No results found for this basename: GLUCAP,  in the last 168 hours  Radiological Exams on Admission: Ct Abdomen Pelvis Wo Contrast  08/15/2013   CLINICAL DATA:  Abdominal pain.  No recent bowel movement.  EXAM: CT ABDOMEN AND PELVIS WITHOUT CONTRAST  TECHNIQUE: Multidetector CT imaging of the abdomen and pelvis was performed following the standard protocol without intravenous contrast.  COMPARISON:  12/30/2012 CT.  FINDINGS: Moderate stool rectosigmoid region. Scattered colonic diverticula without extra luminal bowel inflammatory process, free fluid or free air.  Lateral to the right rectus muscle there is bulge/ mild Spigelian hernia containing fat and small bowel loops. This does not cause obstruction.  Suprapubic catheter in place.  Left adrenal nodule consistent with small adenoma stable.  Post cholecystectomy and hysterectomy.  Taking into account limitation by non contrast imaging, No worrisome hepatic, splenic, renal, right renal or pancreatic lesion.  Atherosclerotic type changes coronary arteries, aorta and aortic branch vessels. No abdominal aortic aneurysm.  Chronic L1 anterior wedge compression deformity.  IMPRESSION: Moderate stool rectosigmoid region. Scattered colonic diverticula without extra luminal bowel inflammatory process,  free fluid or free air.  Please see above.   Electronically Signed   By: Bridgett Larsson M.D.   On: 08/15/2013 16:54    EKG: Pending  Assessment/Plan Active Problems:   Urinary retention   Diabetes mellitus type 2, uncontrolled   Lower extremity edema   Atrial fibrillation   History of pulmonary embolism   HTN (hypertension)   Abdominal hernia   UTI (urinary tract infection)   Constipation    Urosepsis /urinary retention requiring suprapubic cath/overactive bladder -Continue antibiotics per pharmacy medications -Urine culture pending -Blood culture pending  Constipation -  Fleets enema -0.5 bottle magnesium citrate PO  Diabetes type 2 uncontrolled -Substitute agent for Januvia -Resistant SSI -May have to restart some of patient's home dose of Lantus -Obtain hemoglobin A1c -Obtain lipid panel -Carb modified diet  Atrial fibrillation -Obtain EKG -Obtain troponin x3 -Obtain proBNP -Cardizem 120 mg daily -Coumadin per pharmacy  HTN -Cardizem 120 mg  Daily -Hold Cozaar secondary to patient's increasing creatinine -Currently patient within AHA guidelines if required would use hydralazine or beta blocker -  Acute on chronic renal failure -Gently hydrate -Hold Lasix, Cozaar  Abdominal hernia -Consult GI in the a.m. -CT read as non-incarcerated hernia -Obtain lactic acid  Hx pulmonary embolism -Patient on chronic anticoagulation  Hyperthyroidism? -Daily methimazole -Obtain TSH/free T4/free T3        Code Status:DNR Family Communication: Primary power of attorney Derrel Nip [phone],. Secondary POA Jeannine Kitten [phone]  Disposition Plan: Resolution of urosepsis  Time spent: 90 minutes  Drema [place] [facility] Pager [phone]  If 7PM-7AM, please contact night-coverage [email or web address] Password [email or web address] 08/15/2013, 7:12 PM

## 2013-08-15 NOTE — ED Notes (Signed)
Pt reports she has finished her contrast. CT made aware. Pt was to be a 2 hr drinker.

## 2013-08-15 NOTE — ED Provider Notes (Signed)
CSN: [id number]     Arrival date & time 08/15/13  1129 History   First MD Initiated Contact with Patient 08/15/13 1157     Chief Complaint  Patient presents with  . Abdominal Pain     (Consider location/radiation/quality/duration/timing/severity/associated sxs/prior Treatment) The history is provided by a relative. No language interpreter was used.  Krystal [name] is a 80 year old female with past medical history of hypertension, GERD, diabetes, hyperlipidemia, coronary artery disease, vertigo presenting to the ED with abdominal pain that is been ongoing for the past several days. As per daughter's report, reported that patient is been having right lower quadrant discomfort without radiation. Reported that patient has not had a bowel movement in the past 5 days. Stated that milk of magnesia, laxatives, increase in hydration have been given to the patient with no relief. Daughter reported that patient had a similar episode approximately 5 years ago where she needs to be admitted to the hospital. Reported that patient was by herself with a home health aid-patient is suprapubic catheter. Denied back pain, chest pain, shortness of breath, difficulty breathing, headache, dizziness, changes to eating, weakness. Disorder denied altered mental status.  Past Medical History  Diagnosis Date  . Hypertension   . GERD (gastroesophageal reflux disease)   . Diabetes mellitus   . History of atrial fibrillation EPISODE YRS AGO  . Urinary retention     s/p suprapubic catheter November 2013  . Coronary artery disease cardiologist- dr Maylon Cos--  lov 09-12-2010 in epic    non-obstructinve min. cad  . Foley catheter in place   . Edema of lower extremity BILATERAL LEG  . Hyperlipidemia   . History of pulmonary embolus (PE) 2010--  TAKES COUMADIN  . History of DVT of lower extremity   . Anticoagulated on Coumadin   . Generalized weakness AGE RELATED--  USES WALKER  . Vertigo   . OA (osteoarthritis)   . Thin  skin FRAGILE  . Hypothyroidism NO MEDS  . Impaired hearing BILATERAL AIDS  . Chronic diarrhea INTERMITTANT   Past Surgical History  Procedure Laterality Date  . Knee surgery    . Tubal ligation    . Abdominal hysterectomy    . Cardiac catheterization  09-27-2004  DR TENNENT    NORMAL LVF/ MILD IRREGULARITIES IN MID LAD W/ NO SIGNIFICANT OBSTRUCTIVE DISEASE  . Abdominal hysterectomy  1994  (APPROX)    W/ BILATERAL SALPINGO-OOPHORECTOMY  . Cholecystectomy  2000  . Knee arthroscopy  1998  (APPROX)  . Bladder suspension  2002  (APPROX)    W/ ANTERIOR AND POSTERIOR REPAIR  . Cataract extraction w/ intraocular lens  implant, bilateral    . Transthoracic echocardiogram  02-23-2009  (DX W/ PE & PAF)    EF 65-70%/ MILD MR/ MODERATE TR/ MILDLY DILATED RA  &  LA/  RV SYSTOLIC FUNCTION MODERATLY REDUCED   . Insertion of suprapubic catheter  04/02/2012    Procedure: INSERTION OF SUPRAPUBIC CATHETER;  Surgeon: Garnett Farm, MD;  Location: [facility];  Service: Urology;  Laterality: N/A;  SUPRAPUBIC TUBE PLACEMENT  . Cystoscopy  04/02/2012    Procedure: CYSTOSCOPY;  Surgeon: Garnett Farm, MD;  Location: [facility];  Service: Urology;;   Family History  Problem Relation Age of Onset  . Heart disease Mother   . Hypertension Mother    History  Substance Use Topics  . Smoking status: Never Smoker   . Smokeless tobacco: Current User    Types: Snuff  Comment: DIPPED TOBACCO SINCE AGE 78  . Alcohol Use: No   OB History   Grav Para Term Preterm Abortions TAB SAB Ect Mult Living                 Review of Systems  Constitutional: Negative for fever and chills.  Respiratory: Negative for chest tightness and shortness of breath.   Cardiovascular: Negative for chest pain.  Gastrointestinal: Positive for abdominal pain, constipation and abdominal distention. Negative for nausea, vomiting, diarrhea, blood in stool and anal bleeding.  Musculoskeletal: Negative  for back pain.  Neurological: Negative for dizziness and weakness.  All other systems reviewed and are negative.      Allergies  Humibid la; Latex; Detrol; Keflex; and Vioxx  Home Medications   Current Outpatient Rx  Name  Route  Sig  Dispense  Refill  . ALPRAZolam (XANAX) 0.25 MG tablet   Oral   Take 0.125-0.25 mg by mouth 2 (two) times daily as needed for sleep or anxiety (0.5-1 tablet).          . cetirizine (ZYRTEC) 10 MG chewable tablet   Oral   Chew 10 mg by mouth daily.         Marland Kitchen diltiazem (CARDIZEM) 120 MG tablet   Oral   Take 120 mg by mouth 2 (two) times daily.         . ferrous sulfate 325 (65 FE) MG tablet   Oral   Take 325 mg by mouth at bedtime.          . Fluticasone-Salmeterol (ADVAIR) 250-50 MCG/DOSE AEPB   Inhalation   Inhale 1 puff into the lungs 2 (two) times daily as needed (shortness of breath).          . furosemide (LASIX) 40 MG tablet   Oral   Take 60 mg by mouth daily. Take 1.5 tablets   1 tablet      . insulin glargine (LANTUS) 100 UNIT/ML injection   Subcutaneous   Inject 0.45 mLs (45 Units total) into the skin at bedtime.   10 mL   12   . losartan (COZAAR) 100 MG tablet   Oral   Take 100 mg by mouth daily.         . methimazole (TAPAZOLE) 10 MG tablet   Oral   Take 5 mg by mouth daily.         . metoCLOPramide (REGLAN) 5 MG tablet   Oral   Take 2.5 mg by mouth 4 (four) times daily. 1/2 tablet 4 times daily         . mirabegron ER (MYRBETRIQ) 25 MG TB24 tablet   Oral   Take 25 mg by mouth daily.         . mirtazapine (REMERON) 15 MG tablet   Oral   Take 15 mg by mouth at bedtime.         Marland Kitchen oxybutynin (DITROPAN-XL) 5 MG 24 hr tablet   Oral   Take 5 mg by mouth 3 (three) times daily.          [name] (SYSTANE OP)   Ophthalmic   Apply 1 drop to eye daily as needed (dry eye).         . potassium chloride SA (K-DUR,KLOR-CON) 20 MEQ tablet   Oral   Take 10 mEq by mouth 2  (two) times daily.         . pravastatin (PRAVACHOL) 20 MG tablet   Oral  Take 20 mg by mouth daily.          . sitaGLIPtin (JANUVIA) 50 MG tablet   Oral   Take 50 mg by mouth daily.         . vitamin B-12 (CYANOCOBALAMIN) 1000 MCG tablet   Oral   Take 1,000 mcg by mouth daily.         Marland Kitchen warfarin (COUMADIN) 2 MG tablet   Oral   Take 2-3 mg by mouth daily. 2 mg on sun, tue, thur and 3 mg all other days          BP 109/37  Pulse 70  Temp(Src) 97.4 F (36.3 C) (Oral)  Resp 14  Ht 5' (1.524 m)  Wt 215 lb (97.523 kg)  BMI 41.99 kg/m2  SpO2 96% Physical Exam  Nursing note and vitals reviewed. Constitutional: She is oriented to person, place, and time. She appears well-developed and well-nourished. No distress.  HENT:  Head: Normocephalic and atraumatic.  Mouth/Throat: No oropharyngeal exudate.  Dry mucus membranes  Eyes: Conjunctivae and EOM are normal. Pupils are equal, round, and reactive to light. Right eye exhibits no discharge. Left eye exhibits no discharge.  Neck: Normal range of motion. Neck supple.  Cardiovascular: Normal rate, regular rhythm and normal heart sounds.  Exam reveals no friction rub.   No murmur heard. Swelling to the lower extremities bilaterally with 2+ pitting edema from the dorsal aspects of the feet bilaterally to just below the knee bilaterally Mild erythema noted to the distal tib-fib region bilaterally-circumferential  Pulmonary/Chest: Effort normal and breath sounds normal. No respiratory distress. She has no wheezes. She has no rales.  Abdominal: She exhibits distension. There is tenderness.  Abdominal distention noted Abdomen hard upon palpation with discomfort generalized  Suprapubic catheter identified with erythematous surrounding area of incision site with thick white drainage with odor identified to the suprapubic region.  Musculoskeletal: Normal range of motion.  Neurological: She is alert and oriented to person, place, and  time.  Skin: Skin is warm and dry. No rash noted. She is not diaphoretic. No erythema.  Psychiatric: She has a normal mood and affect. Her behavior is normal. Thought content normal.    ED Course  Procedures (including critical care time)  4:11 PM Patient seen and assessed by attending physician. Attending recommend manual disimpaction to be performed with enema afterwards. Recommended patient to placed admitted to the hospital. Agreed to CT abdomen and pelvis to ule out possible SBO versus constipation.   4:21 PM Fecal disimpaction, manually, performed - very gently. Soft stool noted at per rectum with negative dark stools noted - negative blood on glove noted. Decent amount of stool.   Spoke with pharmacist, Barbara Cower, who recommended Levaquin for treatment - did not recommend Azactam.   Results for orders placed during the hospital encounter of 08/15/13  CBC WITH DIFFERENTIAL      Result Value Ref Range   WBC 16.4 (*) 4.0 - 10.5 K/uL   RBC 4.02  3.87 - 5.11 MIL/uL   Hemoglobin 10.9 (*) 12.0 - 15.0 g/dL   HCT 96.0 (*) 45.4 - 09.8 %   MCV 83.3  78.0 - 100.0 fL   MCH 27.1  26.0 - 34.0 pg   MCHC 32.5  30.0 - 36.0 g/dL   RDW 11.9  14.7 - 82.9 %   Platelets 370  150 - 400 K/uL   Neutrophils Relative % 79 (*) 43 - 77 %   Neutro Abs 13.1 (*) 1.7 -  7.7 K/uL   Lymphocytes Relative 11 (*) 12 - 46 %   Lymphs Abs 1.7  0.7 - 4.0 K/uL   Monocytes Relative 8  3 - 12 %   Monocytes Absolute 1.3 (*) 0.1 - 1.0 K/uL   Eosinophils Relative 2  0 - 5 %   Eosinophils Absolute 0.3  0.0 - 0.7 K/uL   Basophils Relative 0  0 - 1 %   Basophils Absolute 0.0  0.0 - 0.1 K/uL  COMPREHENSIVE METABOLIC PANEL      Result Value Ref Range   Sodium 138  137 - 147 mEq/L   Potassium 5.0  3.7 - 5.3 mEq/L   Chloride 97  96 - 112 mEq/L   CO2 24  19 - 32 mEq/L   Glucose, Bld 198 (*) 70 - 99 mg/dL   BUN 64 (*) 6 - 23 mg/dL   Creatinine, Ser 1.612.81 (*) 0.50 - 1.10 mg/dL   Calcium 9.1  8.4 - 09.610.5 mg/dL   Total Protein 7.6   6.0 - 8.3 g/dL   Albumin 3.4 (*) 3.5 - 5.2 g/dL   AST 13  0 - 37 U/L   ALT 8  0 - 35 U/L   Alkaline Phosphatase 128 (*) 39 - 117 U/L   Total Bilirubin 0.3  0.3 - 1.2 mg/dL   GFR calc non Af Amer 14 (*) >90 mL/min   GFR calc Af Amer 16 (*) >90 mL/min  LIPASE, BLOOD      Result Value Ref Range   Lipase 18  11 - 59 U/L  URINALYSIS, ROUTINE W REFLEX MICROSCOPIC      Result Value Ref Range   Color, Urine YELLOW  YELLOW   APPearance HAZY (*) CLEAR   Specific Gravity, Urine 1.014  1.005 - 1.030   pH 6.5  5.0 - 8.0   Glucose, UA NEGATIVE  NEGATIVE mg/dL   Hgb urine dipstick LARGE (*) NEGATIVE   Bilirubin Urine NEGATIVE  NEGATIVE   Ketones, ur NEGATIVE  NEGATIVE mg/dL   Protein, ur 30 (*) NEGATIVE mg/dL   Urobilinogen, UA 0.2  0.0 - 1.0 mg/dL   Nitrite POSITIVE (*) NEGATIVE   Leukocytes, UA LARGE (*) NEGATIVE  PROTIME-INR      Result Value Ref Range   Prothrombin Time 30.2 (*) 11.6 - 15.2 seconds   INR 3.02 (*) 0.00 - 1.49  APTT      Result Value Ref Range   aPTT 44 (*) 24 - 37 seconds  URINE MICROSCOPIC-ADD ON      Result Value Ref Range   Squamous Epithelial / LPF MANY (*) RARE   WBC, UA TOO NUMEROUS TO COUNT  <3 WBC/hpf   RBC / HPF 7-10  <3 RBC/hpf   Bacteria, UA MANY (*) RARE  TROPONIN I      Result Value Ref Range   Troponin I <0.30  <0.30 ng/mL     Labs Review Labs Reviewed  CBC WITH DIFFERENTIAL - Abnormal; Notable for the following:    WBC 16.4 (*)    Hemoglobin 10.9 (*)    HCT 33.5 (*)    Neutrophils Relative % 79 (*)    Neutro Abs 13.1 (*)    Lymphocytes Relative 11 (*)    Monocytes Absolute 1.3 (*)    All other components within normal limits  COMPREHENSIVE METABOLIC PANEL - Abnormal; Notable for the following:    Glucose, Bld 198 (*)    BUN 64 (*)    Creatinine, Ser 2.81 (*)  Albumin 3.4 (*)    Alkaline Phosphatase 128 (*)    GFR calc non Af Amer 14 (*)    GFR calc Af Amer 16 (*)    All other components within normal limits  URINALYSIS, ROUTINE W  REFLEX MICROSCOPIC - Abnormal; Notable for the following:    APPearance HAZY (*)    Hgb urine dipstick LARGE (*)    Protein, ur 30 (*)    Nitrite POSITIVE (*)    Leukocytes, UA LARGE (*)    All other components within normal limits  PROTIME-INR - Abnormal; Notable for the following:    Prothrombin Time 30.2 (*)    INR 3.02 (*)    All other components within normal limits  APTT - Abnormal; Notable for the following:    aPTT 44 (*)    All other components within normal limits  URINE MICROSCOPIC-ADD ON - Abnormal; Notable for the following:    Squamous Epithelial / LPF MANY (*)    Bacteria, UA MANY (*)    All other components within normal limits  URINE CULTURE  LIPASE, BLOOD  TROPONIN I   Imaging Review    EKG Interpretation None      Date: 08/15/2013  Rate: 70 Rhythm: normal sinus rhythm  QRS Axis: normal  Intervals: normal  ST/T Wave abnormalities: normal  Conduction Disutrbances:none  Narrative Interpretation: Ventricular tachycardia, unsustained, low voltage noted  Old EKG Reviewed: unchanged when compared to 12/30/2012 EKG analyzed and reviewed by attending physician and this provider.   MDM   Final diagnoses:  None   Medications  levofloxacin (LEVAQUIN) IVPB 500 mg (not administered)  aztreonam (AZACTAM) 500 mg in dextrose 5 % 50 mL IVPB (not administered)  iohexol (OMNIPAQUE) 300 MG/ML solution 25 mL (25 mLs Oral Contrast Given 08/15/13 1436)  levofloxacin (LEVAQUIN) IVPB 750 mg (0 mg Intravenous Stopped 08/15/13 1644)  aztreonam (AZACTAM) 1 g in dextrose 5 % 50 mL IVPB (1 g Intravenous New Bag/Given 08/15/13 1645)   Filed Vitals:   08/15/13 1300 08/15/13 1410 08/15/13 1534 08/15/13 1623  BP: 116/51 109/45 112/49 109/37  Pulse: 62 61 67 70  Temp:  97.8 F (36.6 C)  97.4 F (36.3 C)  TempSrc:  Oral  Oral  Resp:  14 14 14   Height:      Weight:      SpO2: 95% 99% 97% 96%    Patient presenting to the ED with abdominal pain that started several days ago  with no bowel movement for the past 5 days. Daughter who accompanies patient reported that she has been given, laxatives and increase in water and ruffage have been given to the patient with minimal relief. Patient reported that she's been having a pain to the right lower quadrant. Alert and oriented. GCS 15. Heart rate and rhythm normal. Lungs clear to auscultation to upper and lower lobes bilaterally. Decreased bowel sounds in all 4 quadrants with abdominal distention noted. Generalized abdominal tenderness upon palpation. Abdomen hard upon palpation. Suprapubic catheter identified with thick yellowish discharge and erythema surrounding the region. Pus in urine identified. Stool soft with negative melenic presentation - negative blood on glove - patient tolerated disimpaction well.  EKG noted normal sinus rhythm with heart rate 70 bpm. Troponin negative elevation. CBC noted elevation white blood cell count 16.4 with elevated neutrophils. Hgb noted to be 10.9 - appears to be a trend when compared to other levels, patient has been as low as 9.5 approximately 5 months ago. CMP noted elevated creatinine at  2.81 and BUN at 64 over the past 5 months. Elevated alkaline phosphatase of 120. Lipase negative elevation. PT and INR elevated-PT 30.2, INR 3.02. APTT 44. Urinalysis noted large hemoglobin with positive nitrites and large leukocytes white blood cells too numerous to count many bacteria. Plan is for CT abdomen and pelvis with PO contrast to be performed to rule out SBO versus constipation. Patient's Creatinine has elevated when compare to 5 months ago, has increased from 1.54 to 2.81 - almost double-fold. IV fluids have to be monitored secondary to patient with history of CHF, as well as swelling to the lower extremities to be monitored to prevent fluid overload. Patient started on antibiotics regarding UTI noted - suspicion to bo source of elevated WBC. Patient stable, afebrile - mildly lite pressures noted -  patient asymptomatic. Patient does not appear septic. Pulse ox within normal limits.  Discussed case with Sharilyn Sites, PA-C at change in shift. Transfer of care to Sharilyn Sites, PA-C at change in shift. Plan is for pending CT abdomen and pelvis and patient to be admitted to the hospital.   Raymon Mutton, PA-C 08/15/13 1841  Raymon Mutton, PA-C 08/15/13 1851  Raymon Mutton, PA-C 08/15/13 2100

## 2013-08-15 NOTE — ED Notes (Signed)
CT reporting that patient will be sent for at 1645 for CT scan. PA made aware.

## 2013-08-15 NOTE — ED Notes (Signed)
Pt has suprapubic catheter, buildup of residue around site and in skin fold. Foul odor noted. Pt clean with soap and water. Also, redness in between skin folds.

## 2013-08-15 NOTE — Progress Notes (Signed)
ANTIBIOTIC CONSULT NOTE - INITIAL  Pharmacy Consult for Levaquin and Aztreonam Indication: urosepsis  Allergies  Allergen Reactions  . Humibid La [Guaifenesin] Shortness Of Breath  . Latex Hives  . Detrol [Tolterodine Tartrate] Hives  . Keflex [Cephalexin]     hives  . Vioxx [Rofecoxib] Hives    Patient Measurements: Height: 5' (152.4 cm) Weight: 215 lb (97.523 kg) IBW/kg (Calculated) : 45.5  Vital Signs: Temp: 97.8 F (36.6 C) (03/30 1410) Temp src: Oral (03/30 1410) BP: 109/45 mmHg (03/30 1410) Pulse Rate: 61 (03/30 1410) Intake/Output from previous day:   Intake/Output from this shift:    Labs:  Recent Labs  08/15/13 1205  WBC 16.4*  HGB 10.9*  PLT 370  CREATININE 2.81*   Estimated Creatinine Clearance: 14.8 ml/min (by C-G formula based on Cr of 2.81). No results found for this basename: VANCOTROUGH, VANCOPEAK, VANCORANDOM, GENTTROUGH, GENTPEAK, GENTRANDOM, TOBRATROUGH, TOBRAPEAK, TOBRARND, AMIKACINPEAK, AMIKACINTROU, AMIKACIN,  in the last 72 hours   Microbiology: No results found for this or any previous visit (from the past 720 hour(s)).  Medical History: Past Medical History  Diagnosis Date  . Hypertension   . GERD (gastroesophageal reflux disease)   . Diabetes mellitus   . History of atrial fibrillation EPISODE YRS AGO  . Urinary retention     s/p suprapubic catheter November 2013  . Coronary artery disease cardiologist- dr Maylon Costennent--  lov 09-12-2010 in epic    non-obstructinve min. cad  . Foley catheter in place   . Edema of lower extremity BILATERAL LEG  . Hyperlipidemia   . History of pulmonary embolus (PE) 2010--  TAKES COUMADIN  . History of DVT of lower extremity   . Anticoagulated on Coumadin   . Generalized weakness AGE RELATED--  USES WALKER  . Vertigo   . OA (osteoarthritis)   . Thin skin FRAGILE  . Hypothyroidism NO MEDS  . Impaired hearing BILATERAL AIDS  . Chronic diarrhea INTERMITTANT    Medications:  See electronic med  rec  Assessment: 80 y.o. female presents with abd pain. Pt with chronic suprapubic cath since November 2013 for urinary retention. SCr 2.81, est CrCl 15 ml/min. Noted SCr 1.5 (5mos ago). WBC elevated. Afeb. To begin Aztreonam and Levaquin for urosepsis  Goal of Therapy:  resolution of infection  Plan:  1. Levaquin 750mg  IV now then 500mg  IV q48h 2. Aztreonam 1gm IV now then 500mg  IV q8h. 3. Will f/u micro data, renal function, pt's clinical condition 4. Narrow abx when able 5. F/u continuation of coumadin upon admission  Christoper Fabianaron Anush Wiedeman, PharmD, BCPS Clinical pharmacist, pager [phone] 08/15/2013,2:19 PM

## 2013-08-15 NOTE — ED Notes (Signed)
Pt family reports that she was recently placed on medication for UTI and was told that the medication could cause diarrhea. Family gave her some medication for the diarrhea and not has not had a bowel movement for the past couple of days. Pt has a hx of impaction and thinks that this is the same. Pt reports that she feels full

## 2013-08-15 NOTE — Progress Notes (Signed)
ANTICOAGULATION CONSULT NOTE - Initial Consult  Pharmacy Consult for warfarin Indication: atrial fibrillation  Allergies  Allergen Reactions  . Humibid La [Guaifenesin] Shortness Of Breath  . Latex Hives  . Detrol [Tolterodine Tartrate] Hives  . Keflex [Cephalexin]     hives  . Vioxx [Rofecoxib] Hives    Patient Measurements: Height: 5' (152.4 cm) Weight: 215 lb (97.523 kg) IBW/kg (Calculated) : 45.5  Vital Signs: Temp: 97.4 F (36.3 C) (03/30 1844) Temp src: Oral (03/30 1844) BP: 106/38 mmHg (03/30 1945) Pulse Rate: 66 (03/30 1945)  Labs:  Recent Labs  08/15/13 1205 08/15/13 1610  HGB 10.9*  --   HCT 33.5*  --   PLT 370  --   APTT 44*  --   LABPROT 30.2*  --   INR 3.02*  --   CREATININE 2.81*  --   TROPONINI  --  <0.30    Estimated Creatinine Clearance: 14.8 ml/min (by C-G formula based on Cr of 2.81).   Medical History: Past Medical History  Diagnosis Date  . Hypertension   . GERD (gastroesophageal reflux disease)   . Diabetes mellitus   . History of atrial fibrillation EPISODE YRS AGO  . Urinary retention     s/p suprapubic catheter November 2013  . Coronary artery disease cardiologist- dr Maylon Costennent--  lov 09-12-2010 in epic    non-obstructinve min. cad  . Foley catheter in place   . Edema of lower extremity BILATERAL LEG  . Hyperlipidemia   . History of pulmonary embolus (PE) 2010--  TAKES COUMADIN  . History of DVT of lower extremity   . Anticoagulated on Coumadin   . Generalized weakness AGE RELATED--  USES WALKER  . Vertigo   . OA (osteoarthritis)   . Thin skin FRAGILE  . Hypothyroidism NO MEDS  . Impaired hearing BILATERAL AIDS  . Chronic diarrhea INTERMITTANT   Assessment: 89 YOF with history of AFib on chronic warfarin- home dose 3mg  daily except 2mg  on Sundays, Tuesdays and Thursdays with last dose yesterday evening. INR on admission slightly elevated at 3.02. No bleeding noted. Baseline Hgb 10.9 with plts 370. Note- patient was started  on levofloxacin today which can potentiate INR.  Goal of Therapy:  INR 2-3 Monitor platelets by anticoagulation protocol: Yes   Plan:  1. Hold warfarin tonight as INR elevated 2. Daily PT/INR and CBC 3. Follow for s/s bleeding  Krystal [name], PharmD, BCPS Clinical Pharmacist Pager: [phone] 08/15/2013 8:06 PM

## 2013-08-16 LAB — COMPREHENSIVE METABOLIC PANEL
ALK PHOS: 116 U/L (ref 39–117)
ALT: 7 U/L (ref 0–35)
AST: 13 U/L (ref 0–37)
Albumin: 2.9 g/dL — ABNORMAL LOW (ref 3.5–5.2)
BILIRUBIN TOTAL: 0.3 mg/dL (ref 0.3–1.2)
BUN: 61 mg/dL — AB (ref 6–23)
CHLORIDE: 96 meq/L (ref 96–112)
CO2: 28 mEq/L (ref 19–32)
Calcium: 8.8 mg/dL (ref 8.4–10.5)
Creatinine, Ser: 2.67 mg/dL — ABNORMAL HIGH (ref 0.50–1.10)
GFR, EST AFRICAN AMERICAN: 17 mL/min — AB (ref >90)
GFR, EST NON AFRICAN AMERICAN: 15 mL/min — AB (ref >90)
GLUCOSE: 103 mg/dL — AB (ref 70–99)
Potassium: 4.3 mEq/L (ref 3.7–5.3)
Sodium: 138 mEq/L (ref 137–147)
Total Protein: 6.9 g/dL (ref 6.0–8.3)

## 2013-08-16 LAB — GLUCOSE, CAPILLARY
GLUCOSE-CAPILLARY: 121 mg/dL — AB (ref 70–99)
GLUCOSE-CAPILLARY: 142 mg/dL — AB (ref 70–99)
Glucose-Capillary: 110 mg/dL — ABNORMAL HIGH (ref 70–99)
Glucose-Capillary: 121 mg/dL — ABNORMAL HIGH (ref 70–99)
Glucose-Capillary: 170 mg/dL — ABNORMAL HIGH (ref 70–99)
Glucose-Capillary: 165 mg/dL — ABNORMAL HIGH (ref 70–99)

## 2013-08-16 LAB — CBC WITH DIFFERENTIAL/PLATELET
BASOS PCT: 0 % (ref 0–1)
Basophils Absolute: 0 10*3/uL (ref 0.0–0.1)
Eosinophils Absolute: 0.4 10*3/uL (ref 0.0–0.7)
Eosinophils Relative: 4 % (ref 0–5)
HCT: 31.3 % — ABNORMAL LOW (ref 36.0–46.0)
Hemoglobin: 10.1 g/dL — ABNORMAL LOW (ref 12.0–15.0)
Lymphocytes Relative: 21 % (ref 12–46)
Lymphs Abs: 2.3 10*3/uL (ref 0.7–4.0)
MCH: 26.7 pg (ref 26.0–34.0)
MCHC: 32.3 g/dL (ref 30.0–36.0)
MCV: 82.8 fL (ref 78.0–100.0)
MONO ABS: 1.2 10*3/uL — AB (ref 0.1–1.0)
Monocytes Relative: 11 % (ref 3–12)
NEUTROS PCT: 64 % (ref 43–77)
Neutro Abs: 7.1 10*3/uL (ref 1.7–7.7)
Platelets: 322 10*3/uL (ref 150–400)
RBC: 3.78 MIL/uL — ABNORMAL LOW (ref 3.87–5.11)
RDW: 14.8 % (ref 11.5–15.5)
WBC: 11 10*3/uL — ABNORMAL HIGH (ref 4.0–10.5)

## 2013-08-16 LAB — PROTIME-INR
INR: 2.62 — ABNORMAL HIGH (ref 0.00–1.49)
Prothrombin Time: 27.1 seconds — ABNORMAL HIGH (ref 11.6–15.2)

## 2013-08-16 LAB — HEMOGLOBIN A1C
Hgb A1c MFr Bld: 6.6 % — ABNORMAL HIGH (ref <5.7)
Mean Plasma Glucose: 143 mg/dL — ABNORMAL HIGH (ref <117)

## 2013-08-16 LAB — OCCULT BLOOD, POC DEVICE: Fecal Occult Bld: POSITIVE — AB

## 2013-08-16 LAB — T3, FREE: T3, Free: 2.6 pg/mL (ref 2.3–4.2)

## 2013-08-16 LAB — TSH: TSH: 1.3 u[IU]/mL

## 2013-08-16 LAB — T4, FREE: FREE T4: 1.24 ng/dL (ref 0.80–1.80)

## 2013-08-16 LAB — MAGNESIUM: MAGNESIUM: 3.6 mg/dL — AB (ref 1.5–2.5)

## 2013-08-16 MED ORDER — WARFARIN SODIUM 2 MG PO TABS
2.0000 mg | ORAL_TABLET | Freq: Once | ORAL | Status: AC
  Administered 2013-08-16: 2 mg via ORAL
  Filled 2013-08-16: qty 1

## 2013-08-16 MED ORDER — PROMETHAZINE HCL 25 MG PO TABS: 12.5000 mg | ORAL_TABLET | Freq: Three times a day (TID) | ORAL | Status: DC | PRN

## 2013-08-16 MED ORDER — PROMETHAZINE HCL 12.5 MG RE SUPP: 12.5000 mg | Freq: Three times a day (TID) | RECTAL | Status: DC | PRN

## 2013-08-16 MED ORDER — SENNOSIDES-DOCUSATE SODIUM 8.6-50 MG PO TABS
1.0000 | ORAL_TABLET | Freq: Two times a day (BID) | ORAL | Status: DC
  Administered 2013-08-16 – 2013-08-18 (×4): 1 via ORAL
  Filled 2013-08-16 (×3): qty 1

## 2013-08-16 MED ORDER — MILK AND MOLASSES ENEMA
1.0000 | Freq: Once | RECTAL | Status: DC
  Filled 2013-08-16: qty 250

## 2013-08-16 MED ORDER — FLEET ENEMA 7-19 GM/118ML RE ENEM
1.0000 | ENEMA | Freq: Once | RECTAL | Status: AC
  Administered 2013-08-17: 1 via RECTAL
  Filled 2013-08-16: qty 1

## 2013-08-16 MED ORDER — PROMETHAZINE HCL 25 MG/ML IJ SOLN
6.2500 mg | Freq: Three times a day (TID) | INTRAMUSCULAR | Status: DC | PRN
  Administered 2013-08-16: 6.25 mg via INTRAVENOUS
  Filled 2013-08-16: qty 1

## 2013-08-16 NOTE — Progress Notes (Signed)
Utilization review completed.  

## 2013-08-16 NOTE — Consult Note (Signed)
S: Pt seen and examined with daughter present. S-p tuber changed by [facility] PA. Wound undressed. Clean, but with serosanguinous drainage. Catheter hanging down at bedside.  O; tube patent. Silicone, non-latex catheter ( latex allergy) A: I have had a conversation with the charge Nurse tonight re: my concerns regarding the care of Mrs Colette RibasByrd today: (a). Catheter may have been pulled out upon transfer from ED per pt' daughter. She noted that her mother was wet with urine early this AM upon transfer, which was "rough".  (b) female "nurse", aide or attendant examined s-p tube and proclaimed that he was unable to get the catheter to drain, and that he wan "not going to mess with it". (c)  Hospitalist was called, but did not come to replace catheter. He called consult. He did not explain context, or that it was an emergency. (d) [facility] PA came to do consult and found s-p tube lying on floor. She asked nurses about the catheter, and no one seemed to know , or care, about the catheter.  (e) non-latex catheter ordered, but central supply sent up a coated latex catheter-returned, and > 1 hr for latex-free catheter to be sent to pt's room. S-p tube was replaced. (f) wound inspection by myself shows no dressing on wound, and catheter without leg strap to reduce tension.  With this in mind, I have discussed my concerns with the charge nurse, and we have agreed that all nursing staff, both primary care and charge, need more education regarding care of s-p tubes. She will discuss this with the nursing staff. She will discuss this with the patient's daughter also.

## 2013-08-16 NOTE — Progress Notes (Signed)
Pt complaining of abdominal discomfort and feels like she needs to have a bowel movement. Pt attempted but was unsuccessful. Pt did not receive enema ordered this morning. On call MD notified. New order for a fleets enema given.

## 2013-08-16 NOTE — Care Management Note (Signed)
    Page 1 of 2   08/18/2013     12:28:02 PM   CARE MANAGEMENT NOTE 08/18/2013  Patient:  Krystal [name],Krystal [name]   Account Number:  [id number]  Date Initiated:  08/16/2013  Documentation initiated by:  Krystal [name],Krystal [name]  Subjective/Objective Assessment:   dx uti  admit- lives alone.     Action/Plan:   will need pt eval   Anticipated DC Date:  08/18/2013   Anticipated DC Plan:  HOME W HOME HEALTH SERVICES      DC Planning Services  CM consult      [facility]   Choice offered to / List presented to:  C-4 Adult Children   DME arranged  WHEELCHAIR - MANUAL      DME agency  [facility]     HH arranged  HH-1 RN  HH-2 PT  HH-3 OT  HH-6 SOCIAL WORKER      HH agency  [facility]   Status of service:  Completed, signed off Medicare Important Message given?   (If response is "NO", the following Medicare IM given date fields will be blank) Date Medicare IM given:   Date Additional Medicare IM given:    Discharge Disposition:  HOME W HOME HEALTH SERVICES  Per UR Regulation:  Reviewed for med. necessity/level of care/duration of stay  If discussed at Long Length of Stay Meetings, dates discussed:    Comments:  08/18/13 1224 Krystal Capeeborah Blakelyn Dinges RN, BSN [phone] patient has an aide that works with her at home, Physical therapy recs snf, but patient has refused snf and wants to home with hh services, [facility], PT, OT and will add Child psychotherapistsocial worker as well.  Daughter states they would need a w/chair to help  patient do her ADL's.  The w/chair will be delivered to patient's home and Krystal [name] will be the contact person at [phone]. [name] notified, soc will begin 24-48 hrs post dc. Patient is for dc today.

## 2013-08-16 NOTE — Consult Note (Signed)
Urology Consult   Physician requesting consult: Gherghe  Reason for consult: Leaking SP tube  History of Present Illness: Krystal [name] is a 80 y.o. female with PMH significant for HTN, DM, afib, and urinary retention s/p SP tube placement who was recently admitted for eval of abdominal pain.  Pt has not had a BM in over 5 days despite OTC treatments given by her daughters.  CT revealed moderate stool in the rectosigmoid colon and a right sided spigelian hernia that is not causing obstruction. She has an SP tube in place and has been having this changed in our office monthly.  She has issues with bladder spasms and urine leakage around the cath for which she has been taking Myrbetriq.  This was not sufficiently treating her spasms and oxybutynin was added approx 3 weeks ago.  She was started on Levaquin for suspected UTI.  There was increased leakage around her SP tube last night and today, therefore, urology consult was requested.  She has continued to receive oxybutynin while in the hospital.   On my first inspection of the pt, nursing aids were turning the pt to clean her after a BM.  I noted that the SP tube was out of the pt and the balloon was deflated.  I asked the aid and her nurse who removed the catheter and when but no one was able to answer these questions.    She is currently resting comfortably and her only complaint is of some upper abdominal discomfort.  She has had persistent nausea today as well.    Past Medical History  Diagnosis Date  . Hypertension   . GERD (gastroesophageal reflux disease)   . Diabetes mellitus   . History of atrial fibrillation EPISODE YRS AGO  . Urinary retention     s/p suprapubic catheter November 2013  . Coronary artery disease cardiologist- dr Maylon Cos--  lov 09-12-2010 in epic    non-obstructinve min. cad  . Foley catheter in place   . Edema of lower extremity BILATERAL LEG  . Hyperlipidemia   . History of pulmonary embolus (PE) 2010--  TAKES  COUMADIN  . History of DVT of lower extremity   . Anticoagulated on Coumadin   . Generalized weakness AGE RELATED--  USES WALKER  . Vertigo   . OA (osteoarthritis)   . Thin skin FRAGILE  . Hypothyroidism NO MEDS  . Impaired hearing BILATERAL AIDS  . Chronic diarrhea INTERMITTANT    Past Surgical History  Procedure Laterality Date  . Knee surgery    . Tubal ligation    . Abdominal hysterectomy    . Cardiac catheterization  09-27-2004  DR TENNENT    NORMAL LVF/ MILD IRREGULARITIES IN MID LAD W/ NO SIGNIFICANT OBSTRUCTIVE DISEASE  . Abdominal hysterectomy  1994  (APPROX)    W/ BILATERAL SALPINGO-OOPHORECTOMY  . Cholecystectomy  2000  . Knee arthroscopy  1998  (APPROX)  . Bladder suspension  2002  (APPROX)    W/ ANTERIOR AND POSTERIOR REPAIR  . Cataract extraction w/ intraocular lens  implant, bilateral    . Transthoracic echocardiogram  02-23-2009  (DX W/ PE & PAF)    EF 65-70%/ MILD MR/ MODERATE TR/ MILDLY DILATED RA  &  LA/  RV SYSTOLIC FUNCTION MODERATLY REDUCED   . Insertion of suprapubic catheter  04/02/2012    Procedure: INSERTION OF SUPRAPUBIC CATHETER;  Surgeon: Garnett Farm, MD;  Location: [facility];  Service: Urology;  Laterality: N/A;  SUPRAPUBIC TUBE PLACEMENT  .  Cystoscopy  04/02/2012    Procedure: CYSTOSCOPY;  Surgeon: Garnett Farm, MD;  Location: [facility];  Service: Urology;;     Current Hospital Medications:  Home meds:    Medication List    ASK your doctor about these medications       ALPRAZolam 0.25 MG tablet  Commonly known as:  XANAX  Take 0.125-0.25 mg by mouth 2 (two) times daily as needed for sleep or anxiety (0.5-1 tablet).     cetirizine 10 MG chewable tablet  Commonly known as:  ZYRTEC  Chew 10 mg by mouth daily.     diltiazem 120 MG tablet  Commonly known as:  CARDIZEM  Take 120 mg by mouth 2 (two) times daily.     ferrous sulfate 325 (65 FE) MG tablet  Take 325 mg by mouth at bedtime.      Fluticasone-Salmeterol 250-50 MCG/DOSE Aepb  Commonly known as:  ADVAIR  Inhale 1 puff into the lungs 2 (two) times daily as needed (shortness of breath).     furosemide 40 MG tablet  Commonly known as:  LASIX  Take 60 mg by mouth daily. Take 1.5 tablets     insulin glargine 100 UNIT/ML injection  Commonly known as:  LANTUS  Inject 0.45 mLs (45 Units total) into the skin at bedtime.     losartan 100 MG tablet  Commonly known as:  COZAAR  Take 100 mg by mouth daily.     methimazole 10 MG tablet  Commonly known as:  TAPAZOLE  Take 5 mg by mouth daily.     metoCLOPramide 5 MG tablet  Commonly known as:  REGLAN  Take 2.5 mg by mouth 4 (four) times daily. 1/2 tablet 4 times daily     mirtazapine 15 MG tablet  Commonly known as:  REMERON  Take 15 mg by mouth at bedtime.     MYRBETRIQ 25 MG Tb24 tablet  Generic drug:  mirabegron ER  Take 25 mg by mouth daily.     oxybutynin 5 MG 24 hr tablet  Commonly known as:  DITROPAN-XL  Take 5 mg by mouth 3 (three) times daily.     potassium chloride SA 20 MEQ tablet  Commonly known as:  K-DUR,KLOR-CON  Take 10 mEq by mouth 2 (two) times daily.     pravastatin 20 MG tablet  Commonly known as:  PRAVACHOL  Take 20 mg by mouth daily.     sitaGLIPtin 50 MG tablet  Commonly known as:  JANUVIA  Take 50 mg by mouth daily.     SYSTANE OP  Apply 1 drop to eye daily as needed (dry eye).     vitamin B-12 1000 MCG tablet  Commonly known as:  CYANOCOBALAMIN  Take 1,000 mcg by mouth daily.     warfarin 2 MG tablet  Commonly known as:  COUMADIN  Take 2-3 mg by mouth daily. 2 mg on sun, tue, thur and 3 mg all other days        Scheduled Meds: . diltiazem  120 mg Oral Q12H  . ferrous sulfate  325 mg Oral QHS  . insulin aspart  0-20 Units Subcutaneous 6 times per day  . [START ON 08/17/2013] levofloxacin (LEVAQUIN) IV  500 mg Intravenous Q48H  . methimazole  5 mg Oral Daily  . metoCLOPramide  2.5 mg Oral QID  . milk and molasses  1  enema Rectal Once  . mirtazapine  15 mg Oral QHS  . mometasone-formoterol  2 puff  Inhalation BID  . senna-docusate  1 tablet Oral BID  . simvastatin  10 mg Oral q1800  . vitamin B-12  1,000 mcg Oral Daily  . warfarin  2 mg Oral ONCE-1800  . Warfarin - Pharmacist Dosing Inpatient   Does not apply q1800   Continuous Infusions: . sodium chloride 75 mL/hr (08/16/13 1144)   PRN Meds:.acetaminophen, acetaminophen, morphine injection, ondansetron (ZOFRAN) IV, ondansetron, promethazine, promethazine, promethazine  Allergies:  Allergies  Allergen Reactions  . Humibid La [Guaifenesin] Shortness Of Breath  . Latex Hives  . Detrol [Tolterodine Tartrate] Hives  . Keflex [Cephalexin]     hives  . Vioxx [Rofecoxib] Hives    Family History  Problem Relation Age of Onset  . Heart disease Mother   . Hypertension Mother     Social History:  reports that she has never smoked. Her smokeless tobacco use includes Snuff. She reports that she does not drink alcohol or use illicit drugs.  ROS: A complete review of systems was performed.  All systems are negative except for pertinent findings as noted.  Physical Exam:  Vital signs in last 24 hours: Temp:  [97.4 F (36.3 C)-97.6 F (36.4 C)] 97.4 F (36.3 C) (03/31 1317) Pulse Rate:  [66-80] 77 (03/31 1317) Resp:  [12-17] 16 (03/31 1317) BP: (106-140)/(32-76) 119/76 mmHg (03/31 1317) SpO2:  [94 %-98 %] 97 % (03/31 1317) Weight:  [102.8 kg (226 lb 10.1 oz)] 102.8 kg (226 lb 10.1 oz) (03/31 0551) Constitutional:  Alert and oriented, No acute distress Cardiovascular: irregular Respiratory: Normal respiratory effort GI: Abdomen is soft, nontender, nondistended, no abdominal masses GU: SP tube site clean with minimal urine leakage noted. Lymphatic: No lymphadenopathy Neurologic: Grossly intact, no focal deficits Psychiatric: Normal mood and affect  Laboratory Data:   Recent Labs  08/15/13 1205 08/15/13 2235 08/16/13 0640  WBC 16.4*  11.7* 11.0*  HGB 10.9* 10.1* 10.1*  HCT 33.5* 30.8* 31.3*  PLT 370 318 322     Recent Labs  08/15/13 1205 08/15/13 2235 08/16/13 0640  NA 138  --  138  K 5.0  --  4.3  CL 97  --  96  GLUCOSE 198*  --  103*  BUN 64*  --  61*  CALCIUM 9.1  --  8.8  CREATININE 2.81* 2.83* 2.67*     Results for orders placed during the hospital encounter of 08/15/13 (from the past 24 hour(s))  OCCULT BLOOD, POC DEVICE     Status: Abnormal   Collection Time    08/15/13  8:23 PM      Result Value Ref Range   Fecal Occult Bld POSITIVE (*) NEGATIVE  GLUCOSE, CAPILLARY     Status: Abnormal   Collection Time    08/15/13  9:46 PM      Result Value Ref Range   Glucose-Capillary 145 (*) 70 - 99 mg/dL  CBC     Status: Abnormal   Collection Time    08/15/13 10:35 PM      Result Value Ref Range   WBC 11.7 (*) 4.0 - 10.5 K/uL   RBC 3.73 (*) 3.87 - 5.11 MIL/uL   Hemoglobin 10.1 (*) 12.0 - 15.0 g/dL   HCT 40.9 (*) 81.1 - 91.4 %   MCV 82.6  78.0 - 100.0 fL   MCH 27.1  26.0 - 34.0 pg   MCHC 32.8  30.0 - 36.0 g/dL   RDW 78.2  95.6 - 21.3 %   Platelets 318  150 - 400 K/uL  CREATININE, SERUM     Status: Abnormal   Collection Time    08/15/13 10:35 PM      Result Value Ref Range   Creatinine, Ser 2.83 (*) 0.50 - 1.10 mg/dL   GFR calc non Af Amer 14 (*) >90 mL/min   GFR calc Af Amer 16 (*) >90 mL/min  HEMOGLOBIN A1C     Status: Abnormal   Collection Time    08/15/13 10:35 PM      Result Value Ref Range   Hemoglobin A1C 6.6 (*) <5.7 %   Mean Plasma Glucose 143 (*) <117 mg/dL  TSH     Status: None   Collection Time    08/15/13 10:35 PM      Result Value Ref Range   TSH 1.300    T4, FREE     Status: None   Collection Time    08/15/13 10:35 PM      Result Value Ref Range   Free T4 1.24  0.80 - 1.80 ng/dL  T3, FREE     Status: None   Collection Time    08/15/13 10:35 PM      Result Value Ref Range   T3, Free 2.6  2.3 - 4.2 pg/mL  PRO B NATRIURETIC PEPTIDE     Status: None   Collection Time     08/15/13 10:35 PM      Result Value Ref Range   Pro B Natriuretic peptide (BNP) 284.1  0 - 450 pg/mL  LACTIC ACID, PLASMA     Status: None   Collection Time    08/15/13 10:35 PM      Result Value Ref Range   Lactic Acid, Venous 1.0  0.5 - 2.2 mmol/L  LIPID PANEL     Status: Abnormal   Collection Time    08/15/13 10:35 PM      Result Value Ref Range   Cholesterol 112  0 - 200 mg/dL   Triglycerides 81  <161 mg/dL   HDL 39 (*) >09 mg/dL   Total CHOL/HDL Ratio 2.9     VLDL 16  0 - 40 mg/dL   LDL Cholesterol 57  0 - 99 mg/dL  GLUCOSE, CAPILLARY     Status: Abnormal   Collection Time    08/16/13 12:01 AM      Result Value Ref Range   Glucose-Capillary 142 (*) 70 - 99 mg/dL   Comment 1 Notify RN     Comment 2 Documented in Chart    GLUCOSE, CAPILLARY     Status: Abnormal   Collection Time    08/16/13  5:45 AM      Result Value Ref Range   Glucose-Capillary 110 (*) 70 - 99 mg/dL   Comment 1 Notify RN     Comment 2 Documented in Chart    COMPREHENSIVE METABOLIC PANEL     Status: Abnormal   Collection Time    08/16/13  6:40 AM      Result Value Ref Range   Sodium 138  137 - 147 mEq/L   Potassium 4.3  3.7 - 5.3 mEq/L   Chloride 96  96 - 112 mEq/L   CO2 28  19 - 32 mEq/L   Glucose, Bld 103 (*) 70 - 99 mg/dL   BUN 61 (*) 6 - 23 mg/dL   Creatinine, Ser 6.04 (*) 0.50 - 1.10 mg/dL   Calcium 8.8  8.4 - 54.0 mg/dL   Total Protein 6.9  6.0 - 8.3 g/dL   Albumin  2.9 (*) 3.5 - 5.2 g/dL   AST 13  0 - 37 U/L   ALT 7  0 - 35 U/L   Alkaline Phosphatase 116  39 - 117 U/L   Total Bilirubin 0.3  0.3 - 1.2 mg/dL   GFR calc non Af Amer 15 (*) >90 mL/min   GFR calc Af Amer 17 (*) >90 mL/min  MAGNESIUM     Status: Abnormal   Collection Time    08/16/13  6:40 AM      Result Value Ref Range   Magnesium 3.6 (*) 1.5 - 2.5 mg/dL  CBC WITH DIFFERENTIAL     Status: Abnormal   Collection Time    08/16/13  6:40 AM      Result Value Ref Range   WBC 11.0 (*) 4.0 - 10.5 K/uL   RBC 3.78 (*) 3.87  - 5.11 MIL/uL   Hemoglobin 10.1 (*) 12.0 - 15.0 g/dL   HCT 16.1 (*) 09.6 - 04.5 %   MCV 82.8  78.0 - 100.0 fL   MCH 26.7  26.0 - 34.0 pg   MCHC 32.3  30.0 - 36.0 g/dL   RDW 40.9  81.1 - 91.4 %   Platelets 322  150 - 400 K/uL   Neutrophils Relative % 64  43 - 77 %   Neutro Abs 7.1  1.7 - 7.7 K/uL   Lymphocytes Relative 21  12 - 46 %   Lymphs Abs 2.3  0.7 - 4.0 K/uL   Monocytes Relative 11  3 - 12 %   Monocytes Absolute 1.2 (*) 0.1 - 1.0 K/uL   Eosinophils Relative 4  0 - 5 %   Eosinophils Absolute 0.4  0.0 - 0.7 K/uL   Basophils Relative 0  0 - 1 %   Basophils Absolute 0.0  0.0 - 0.1 K/uL  PROTIME-INR     Status: Abnormal   Collection Time    08/16/13  6:40 AM      Result Value Ref Range   Prothrombin Time 27.1 (*) 11.6 - 15.2 seconds   INR 2.62 (*) 0.00 - 1.49  GLUCOSE, CAPILLARY     Status: Abnormal   Collection Time    08/16/13  8:03 AM      Result Value Ref Range   Glucose-Capillary 121 (*) 70 - 99 mg/dL  GLUCOSE, CAPILLARY     Status: Abnormal   Collection Time    08/16/13 11:59 AM      Result Value Ref Range   Glucose-Capillary 121 (*) 70 - 99 mg/dL   Recent Results (from the past 240 hour(s))  URINE CULTURE     Status: None   Collection Time    08/15/13 12:32 PM      Result Value Ref Range Status   Specimen Description URINE, SUPRAPUBIC   Final   Special Requests Normal   Final   Culture  Setup Time     Final   Value: 08/15/2013 17:46     Performed at [facility] Count     Final   Value: >=100,000 COLONIES/ML     Performed at [facility]   Culture     Final   Value: GRAM NEGATIVE RODS     Performed at [facility]   Report Status PENDING   Incomplete    Renal Function:  Recent Labs  08/15/13 1205 08/15/13 2235 08/16/13 0640  CREATININE 2.81* 2.83* 2.67*   Estimated Creatinine Clearance: 16 ml/min (by C-G formula  based on Cr of 2.67).  Radiologic Imaging: Ct Abdomen Pelvis Wo Contrast  08/15/2013   CLINICAL  DATA:  Abdominal pain.  No recent bowel movement.  EXAM: CT ABDOMEN AND PELVIS WITHOUT CONTRAST  TECHNIQUE: Multidetector CT imaging of the abdomen and pelvis was performed following the standard protocol without intravenous contrast.  COMPARISON:  12/30/2012 CT.  FINDINGS: Moderate stool rectosigmoid region. Scattered colonic diverticula without extra luminal bowel inflammatory process, free fluid or free air.  Lateral to the right rectus muscle there is bulge/ mild Spigelian hernia containing fat and small bowel loops. This does not cause obstruction.  Suprapubic catheter in place.  Left adrenal nodule consistent with small adenoma stable.  Post cholecystectomy and hysterectomy.  Taking into account limitation by non contrast imaging, No worrisome hepatic, splenic, renal, right renal or pancreatic lesion.  Atherosclerotic type changes coronary arteries, aorta and aortic branch vessels. No abdominal aortic aneurysm.  Chronic L1 anterior wedge compression deformity.  IMPRESSION: Moderate stool rectosigmoid region. Scattered colonic diverticula without extra luminal bowel inflammatory process, free fluid or free air.  Please see above.   Electronically Signed   By: Bridgett LarssonSteve  Olson M.D.   On: 08/15/2013 16:54    Procedure: area prepped and draped in sterile fashion.  #18 silicon cath inserted into SP site with minimal resistance.  Immediate return of cloudy yellow urine.  Balloon inflated with 6cc sterile water and foley connected to drainage bag.    Impression/Recommendation: SP tube with bladder spasms that create urine leakage--SP tube replaced; this may have to be upsized to a #20 at a later time as this is the cath size she originally had, however, an 18 was the largest silicon cath that could be located (pt has latex allergy).  Oxybutynin was discontinued as the primary side effect of this medication is constipation.  She will resume her myrbetriq when she returns home.  She will need a f/u with Dr. Vernie Ammonsttelin  as an outpt to discuss medication changes if her spasms/leakage persist on Myrbetriq alone.    Her UA appears infected, however, she is asymptomatic and is likely colonized from her SP tube. She was started on Levaquin.  F/u urine culture.   Silas FloodYARBROUGH,Yelitza Reach G. 08/16/2013, 4:23 PM

## 2013-08-16 NOTE — Progress Notes (Addendum)
Spoke with MD about consulting urology for suprapubic catheter. Report received from night shift about nurse on 6 north failure of suprapubic catheter exchange.

## 2013-08-16 NOTE — Progress Notes (Signed)
ANTICOAGULATION & ANTIBIOTIC CONSULT NOTE - Follow Up Consult  Pharmacy Consult for Coumadin and Levaquin Indication: atrial fibrillation and hx DVT/PE;  urosepsis coverage  Allergies  Allergen Reactions  . Humibid La [Guaifenesin] Shortness Of Breath  . Latex Hives  . Detrol [Tolterodine Tartrate] Hives  . Keflex [Cephalexin]     hives  . Vioxx [Rofecoxib] Hives    Patient Measurements: Height: 5' (152.4 cm) Weight: 226 lb 10.1 oz (102.8 kg) IBW/kg (Calculated) : 45.5  Vital Signs: Temp: 97.6 F (36.4 C) (03/31 0551) Temp src: Oral (03/31 0551) BP: 130/64 mmHg (03/31 0925) Pulse Rate: 80 (03/31 0551)  Labs:  Recent Labs  08/15/13 1205 08/15/13 1610 08/15/13 2235 08/16/13 0640  HGB 10.9*  --  10.1* 10.1*  HCT 33.5*  --  30.8* 31.3*  PLT 370  --  318 322  APTT 44*  --   --   --   LABPROT 30.2*  --   --  27.1*  INR 3.02*  --   --  2.62*  CREATININE 2.81*  --  2.83* 2.67*  TROPONINI  --  <0.30  --   --     Estimated Creatinine Clearance: 16 ml/min (by C-G formula based on Cr of 2.67).  Assessment:   Coumadin held 3/30 with INR slightly supratherapeutic. INR is back into target range today.   Home Coumadin regimen: 2 mg TTSun, 3 mg MWFSat.     Levaquin and Aztreonam begun 3/30 pm; Aztreonam dc'd after 1 dose.  Scr has trended down some, but Levaquin dose remains appropriate. Afebrile, WBC 16.4->11.0. GNR in urine culture.  Goal of Therapy:  INR 2-3 appropriate Levaquin dose for renal function and infection Monitor platelets by anticoagulation protocol: Yes   Plan:   Coumadin 2 mg today.  Usual Tuesday dose.  Daily PT/INR.  Will watch for possible effect on Levaquin.  Continue Levaquin 500 mg IV q48hrs - next dose due 4/1.  Follow up renal function and culture data.  Dennie Fettersgan, Maribeth Jiles Donovan, [place] Pager: [phone] 08/16/2013,12:13 PM

## 2013-08-16 NOTE — Progress Notes (Signed)
Nurse tech. Reported during bath at 1445, suprapubic catheter not intact. Suprapubic catheter was intact from the beginning of shift, MD notified earlier in AM of problems with suprapubic catheter. PA was aware and questioned me, every attempt made to find new catheter ASAP. Catheter found in ED and brought to the floor for exchange.

## 2013-08-16 NOTE — Progress Notes (Signed)
Pt arrived to unit alert and oriented x4. On admission pt bottom scabbed, and stage 2 to right buttock; foam dressing applied to sacrum; skin excoriated under abdominal skin fold. Oriented to room, unit, and staff.  Bed in lowest position and call bell is within reach. Will continue to monitor.

## 2013-08-16 NOTE — ED Provider Notes (Signed)
Medical screening examination/treatment/procedure(s) were performed by non-physician practitioner and as supervising physician I was immediately available for consultation/collaboration.   EKG Interpretation None        William Seriyah Collison, MD 08/16/13 0033 

## 2013-08-16 NOTE — Progress Notes (Signed)
PROGRESS NOTE  Krystal [name] ZOX:[id number] DOB: 17-Mar-1925 DOA: 08/15/2013 PCP: Mickie Hillier, MD  HPI: Krystal [name] 80yo WF PMHx hyperthyroidism?, hypertension, GERD, diabetes uncontrolled, hyperlipidemia, coronary artery disease, vertigo presenting to the ED with abdominal pain that is been ongoing for the past several days. As per daughter's report, reported that patient is been having right lower quadrant discomfort without radiation. Reported that patient has not had a bowel movement in the past 5 days. Stated that milk of magnesia, laxatives, increase in hydration have been given to the patient with no relief. Daughter reported that patient had a similar episode approximately 5 years ago where she needs to be admitted to the hospital. Reported that patient was by herself with a home health aid-patient is suprapubic catheter. Denied back pain, chest pain, shortness of breath, difficulty breathing, headache, dizziness, changes to eating, weakness. Disorder denied altered mental status.  Assessment/Plan: Urosepsis/urinary retention requiring suprapubic cath/overactive bladder - continue Levofloxacin. She has had previous urinary tract infections in the past, previous microbiology with bacteria sensitive to ciprofloxacin.  - change suprapubic catheter, RN could not, Urology called Constipation - milk of molasses enema, senna Diabetes type 2 uncontrolled - Resistant SSI  -May have to restart some of patient's home dose of Lantus  Atrial fibrillation - rate controlled, continue Coumadin HTN -Cardizem 120 mg Daily  -Hold Cozaar secondary to patient's increasing creatinine  Acute on chronic renal failure - in the setting of poor po intake, UTI -Gently hydrate  - improving this morning Abdominal hernia  - discussed case with surgery over the phone, hernia without any acute needs, patient can follow up with surgery in clinic in the future and once acute issues resolve  Hx pulmonary embolism  -Patient on chronic anticoagulation  Hyperthyroidism -Daily methimazole  -Obtain TSH/free T4/free T3  Diet: clear liquid Fluids: NS at 75 cc/h DVT Prophylaxis: COumadin  Code Status: DNR Family Communication: d/w patient and daughter   Disposition Plan: inpatient  Consultants:  Urology  Surgery (phone)  Procedures:  none   Antibiotics Levofloxacin 3/30 >>  HPI/Subjective: - still with nausea and right lower quadrant abdominal pain  Objective: Filed Vitals:   08/15/13 2336 08/16/13 0551 08/16/13 0925 08/16/13 0948  BP:  128/67 130/64   Pulse: 70 80    Temp:  97.6 F (36.4 C)    TempSrc:  Oral    Resp: 14 16    Height:      Weight:  102.8 kg (226 lb 10.1 oz)    SpO2: 96% 94%  94%   No intake or output data in the 24 hours ending 08/16/13 1131 Filed Weights   08/15/13 1138 08/16/13 0551  Weight: 97.523 kg (215 lb) 102.8 kg (226 lb 10.1 oz)    Exam:   General:  NAD  Cardiovascular: regular rate and rhythm, without MRG  Respiratory: good air movement, clear to auscultation throughout, no wheezing, ronchi or rales  Abdomen: soft, tender to palpation RLQ  MSK: trace peripheral edema  Neuro: non focal  Data Reviewed: Basic Metabolic Panel:  Recent Labs Lab 08/15/13 1205 08/15/13 2235 08/16/13 0640  NA 138  --  138  K 5.0  --  4.3  CL 97  --  96  CO2 24  --  28  GLUCOSE 198*  --  103*  BUN 64*  --  61*  CREATININE 2.81* 2.83* 2.67*  CALCIUM 9.1  --  8.8  MG  --   --  3.6*   Liver Function  Tests:  Recent Labs Lab 08/15/13 1205 08/16/13 0640  AST 13 13  ALT 8 7  ALKPHOS 128* 116  BILITOT 0.3 0.3  PROT 7.6 6.9  ALBUMIN 3.4* 2.9*    Recent Labs Lab 08/15/13 1205  LIPASE 18   CBC:  Recent Labs Lab 08/15/13 1205 08/15/13 2235 08/16/13 0640  WBC 16.4* 11.7* 11.0*  NEUTROABS 13.1*  --  7.1  HGB 10.9* 10.1* 10.1*  HCT 33.5* 30.8* 31.3*  MCV 83.3 82.6 82.8  PLT 370 318 322   Cardiac Enzymes:  Recent Labs Lab  08/15/13 1610  TROPONINI <0.30   BNP (last 3 results)  Recent Labs  02/28/13 2030 08/15/13 2235  PROBNP 478.4* 284.1   CBG:  Recent Labs Lab 08/15/13 2146 08/16/13 0001 08/16/13 0545 08/16/13 0803  GLUCAP 145* 142* 110* 121*    Recent Results (from the past 240 hour(s))  URINE CULTURE     Status: None   Collection Time    08/15/13 12:32 PM      Result Value Ref Range Status   Specimen Description URINE, SUPRAPUBIC   Final   Special Requests Normal   Final   Culture  Setup Time     Final   Value: 08/15/2013 17:46     Performed at [facility]   Colony Count     Final   Value: >=100,000 COLONIES/ML     [facility]formed at [facility]   Culture     Final   Value: GRAM NEGATIVE RODS     Performed at [facility]   Report Status PENDING   Incomplete     Studies: Ct Abdomen Pelvis Wo Contrast  08/15/2013   CLINICAL DATA:  Abdominal pain.  No recent bowel movement.  EXAM: CT ABDOMEN AND PELVIS WITHOUT CONTRAST  TECHNIQUE: Multidetector CT imaging of the abdomen and pelvis was performed following the standard protocol without intravenous contrast.  COMPARISON:  12/30/2012 CT.  FINDINGS: Moderate stool rectosigmoid region. Scattered colonic diverticula without extra luminal bowel inflammatory process, free fluid or free air.  Lateral to the right rectus muscle there is bulge/ mild Spigelian hernia containing fat and small bowel loops. This does not cause obstruction.  Suprapubic catheter in place.  Left adrenal nodule consistent with small adenoma stable.  Post cholecystectomy and hysterectomy.  Taking into account limitation by non contrast imaging, No worrisome hepatic, splenic, renal, right renal or pancreatic lesion.  Atherosclerotic type changes coronary arteries, aorta and aortic branch vessels. No abdominal aortic aneurysm.  Chronic L1 anterior wedge compression deformity.  IMPRESSION: Moderate stool rectosigmoid region. Scattered colonic diverticula  without extra luminal bowel inflammatory process, free fluid or free air.  Please see above.   Electronically Signed   By: Bridgett LarssonSteve  Olson M.D.   On: 08/15/2013 16:54    Scheduled Meds: . diltiazem  120 mg Oral Q12H  . docusate sodium  100 mg Oral BID  . ferrous sulfate  325 mg Oral QHS  . insulin aspart  0-20 Units Subcutaneous 6 times per day  . [START ON 08/17/2013] levofloxacin (LEVAQUIN) IV  500 mg Intravenous Q48H  . linagliptin  5 mg Oral Daily  . methimazole  5 mg Oral Daily  . metoCLOPramide  2.5 mg Oral QID  . milk and molasses  1 enema Rectal Once  . mirtazapine  15 mg Oral QHS  . mometasone-formoterol  2 puff Inhalation BID  . oxybutynin  5 mg Oral TID  . simvastatin  10 mg Oral q1800  .  vitamin B-12  1,000 mcg Oral Daily  . Warfarin - Pharmacist Dosing Inpatient   Does not apply q1800   Continuous Infusions: . sodium chloride 75 mL/hr at 08/15/13 2128    Principal Problem:   Sepsis Active Problems:   Urinary retention   Diabetes mellitus type 2, uncontrolled   Lower extremity edema   Atrial fibrillation   History of pulmonary embolism   HTN (hypertension)   Abdominal hernia   UTI (urinary tract infection)   Constipation   UTI (lower urinary tract infection)   Time spent: 35  This note has been created with Education officer, environmental. Any transcriptional errors are unintentional.   Pamella Pert, MD [facility] Pager [phone]. If 7 PM - 7 AM, please contact night-coverage at [email or web address], password [facility] 08/16/2013, 11:31 AM  LOS: 1 day

## 2013-08-17 DIAGNOSIS — R339 Retention of urine, unspecified: Secondary | ICD-10-CM

## 2013-08-17 LAB — CBC WITH DIFFERENTIAL/PLATELET
Basophils Absolute: 0 10*3/uL (ref 0.0–0.1)
Basophils Relative: 0 % (ref 0–1)
EOS ABS: 0.1 10*3/uL (ref 0.0–0.7)
EOS PCT: 0 % (ref 0–5)
HCT: 29.6 % — ABNORMAL LOW (ref 36.0–46.0)
HEMOGLOBIN: 9.9 g/dL — AB (ref 12.0–15.0)
LYMPHS ABS: 1 10*3/uL (ref 0.7–4.0)
Lymphocytes Relative: 8 % — ABNORMAL LOW (ref 12–46)
MCH: 27.5 pg (ref 26.0–34.0)
MCHC: 33.4 g/dL (ref 30.0–36.0)
MCV: 82.2 fL (ref 78.0–100.0)
MONOS PCT: 9 % (ref 3–12)
Monocytes Absolute: 1.3 10*3/uL — ABNORMAL HIGH (ref 0.1–1.0)
Neutro Abs: 11.3 10*3/uL — ABNORMAL HIGH (ref 1.7–7.7)
Neutrophils Relative %: 83 % — ABNORMAL HIGH (ref 43–77)
Platelets: 301 10*3/uL (ref 150–400)
RBC: 3.6 MIL/uL — ABNORMAL LOW (ref 3.87–5.11)
RDW: 14.6 % (ref 11.5–15.5)
WBC: 13.7 10*3/uL — ABNORMAL HIGH (ref 4.0–10.5)

## 2013-08-17 LAB — COMPREHENSIVE METABOLIC PANEL
ALK PHOS: 110 U/L (ref 39–117)
ALT: 7 U/L (ref 0–35)
AST: 15 U/L (ref 0–37)
Albumin: 2.7 g/dL — ABNORMAL LOW (ref 3.5–5.2)
BUN: 49 mg/dL — ABNORMAL HIGH (ref 6–23)
CALCIUM: 8.3 mg/dL — AB (ref 8.4–10.5)
CO2: 25 meq/L (ref 19–32)
Chloride: 97 mEq/L (ref 96–112)
Creatinine, Ser: 2.18 mg/dL — ABNORMAL HIGH (ref 0.50–1.10)
GFR calc Af Amer: 22 mL/min — ABNORMAL LOW (ref >90)
GFR calc non Af Amer: 19 mL/min — ABNORMAL LOW (ref >90)
Glucose, Bld: 209 mg/dL — ABNORMAL HIGH (ref 70–99)
POTASSIUM: 4.1 meq/L (ref 3.7–5.3)
SODIUM: 136 meq/L — AB (ref 137–147)
TOTAL PROTEIN: 6.4 g/dL (ref 6.0–8.3)
Total Bilirubin: 0.3 mg/dL (ref 0.3–1.2)

## 2013-08-17 LAB — GLUCOSE, CAPILLARY
GLUCOSE-CAPILLARY: 239 mg/dL — AB (ref 70–99)
GLUCOSE-CAPILLARY: 145 mg/dL — AB (ref 70–99)
GLUCOSE-CAPILLARY: 136 mg/dL — AB (ref 70–99)
Glucose-Capillary: 113 mg/dL — ABNORMAL HIGH (ref 70–99)
Glucose-Capillary: 181 mg/dL — ABNORMAL HIGH (ref 70–99)
Glucose-Capillary: 207 mg/dL — ABNORMAL HIGH (ref 70–99)

## 2013-08-17 LAB — PROTIME-INR
INR: 2.4 — ABNORMAL HIGH (ref 0.00–1.49)
Prothrombin Time: 25.4 seconds — ABNORMAL HIGH (ref 11.6–15.2)

## 2013-08-17 LAB — URINE CULTURE: SPECIAL REQUESTS: NORMAL

## 2013-08-17 LAB — MAGNESIUM: Magnesium: 3.3 mg/dL — ABNORMAL HIGH (ref 1.5–2.5)

## 2013-08-17 MED ORDER — SORBITOL 70 % SOLN
960.0000 mL | TOPICAL_OIL | Freq: Once | ORAL | Status: AC
  Administered 2013-08-17: 960 mL via RECTAL
  Filled 2013-08-17 (×2): qty 240

## 2013-08-17 MED ORDER — CIPROFLOXACIN HCL 500 MG PO TABS
500.0000 mg | ORAL_TABLET | ORAL | Status: DC
  Administered 2013-08-17 – 2013-08-18 (×2): 500 mg via ORAL
  Filled 2013-08-17 (×2): qty 1

## 2013-08-17 MED ORDER — POLYETHYLENE GLYCOL 3350 17 GM/SCOOP PO POWD
0.5000 | Freq: Once | ORAL | Status: AC
  Administered 2013-08-17: 0.5 via ORAL
  Filled 2013-08-17: qty 255

## 2013-08-17 MED ORDER — MINERAL OIL RE ENEM
1.0000 | ENEMA | Freq: Once | RECTAL | Status: DC
  Filled 2013-08-17: qty 1

## 2013-08-17 MED ORDER — WARFARIN SODIUM 3 MG PO TABS
3.0000 mg | ORAL_TABLET | Freq: Once | ORAL | Status: AC
  Administered 2013-08-17: 3 mg via ORAL
  Filled 2013-08-17: qty 1

## 2013-08-17 NOTE — Progress Notes (Signed)
ANTICOAGULATION CONSULT NOTE - Follow Up Consult  Pharmacy Consult for Coumadin Indication: atrial fibrillation and hx DVT/PE  Allergies  Allergen Reactions  . Humibid La [Guaifenesin] Shortness Of Breath  . Latex Hives  . Detrol [Tolterodine Tartrate] Hives  . Keflex [Cephalexin]     hives  . Vioxx [Rofecoxib] Hives    Patient Measurements: Height: 5' (152.4 cm) Weight: 226 lb 10.1 oz (102.8 kg) IBW/kg (Calculated) : 45.5  Vital Signs: Temp: 97.7 F (36.5 C) (04/01 0521) Temp src: Oral (04/01 0521) BP: 128/83 mmHg (04/01 0957) Pulse Rate: 85 (04/01 0957)  Labs:  Recent Labs  08/15/13 1205 08/15/13 1610 08/15/13 2235 08/16/13 0640 08/17/13 0550  HGB 10.9*  --  10.1* 10.1* 9.9*  HCT 33.5*  --  30.8* 31.3* 29.6*  PLT 370  --  318 322 301  APTT 44*  --   --   --   --   LABPROT 30.2*  --   --  27.1* 25.4*  INR 3.02*  --   --  2.62* 2.40*  CREATININE 2.81*  --  2.83* 2.67* 2.18*  TROPONINI  --  <0.30  --   --   --     Estimated Creatinine Clearance: 19.6 ml/min (by C-G formula based on Cr of 2.18).  Assessment: 80 yo F on Coumadin PTA with therapeutic INR on home regimen.     Home Coumadin regimen: 2 mg TTSun, 3 mg MWFSat.  Will need to continue daily INR monitoring while on antibiotics which could elevate INR.   Goal of Therapy:  INR 2-3 Monitor platelets by anticoagulation protocol: Yes   Plan:   Coumadin 3 mg today.  Usual Wednesday dose.  Daily PT/INR.  Will watch for possible effect on Levaquin.    Toys 'R' UsKimberly Keelon Zurn, Pharm.D., BCPS Clinical Pharmacist Pager [phone] 08/17/2013 10:26 AM

## 2013-08-17 NOTE — Progress Notes (Signed)
PROGRESS NOTE  Krystal SmokerLouise P [name] ZOX:[id number] DOB: Nov 26, 1924 DOA: 08/15/2013 PCP: Mickie HillierLITTLE,[name], MD  HPI: Krystal SmokerLouise P Herda 80yo WF PMHx hyperthyroidism?, hypertension, GERD, diabetes uncontrolled, hyperlipidemia, coronary artery disease, vertigo presenting to the ED with abdominal pain that is been ongoing for the past several days. As per daughter's report, reported that patient is been having right lower quadrant discomfort without radiation. Reported that patient has not had a bowel movement in the past 5 days. Stated that milk of magnesia, laxatives, increase in hydration have been given to the patient with no relief. Daughter reported that patient had a similar episode approximately 5 years ago where she needs to be admitted to the hospital. Reported that patient was by herself with a home health aid-patient is suprapubic catheter. Denied back pain, chest pain, shortness of breath, difficulty breathing, headache, dizziness, changes to eating, weakness. Disorder denied altered mental status.   HPI/Subjective: Feels okay, daughter at bedside, complaining about left-sided lower abdominal pain  Assessment/Plan:  Serratia marcescens UTI -Complicated UTI, related to suprapubic catheter. On admission urinalysis was consistent with UTI. -On levofloxacin, I will change to oral ciprofloxacin. -Suprapubic catheter was changed yesterday by urology.  Constipation -Patient had Fleet/tapwater enema yesterday without success, had to do digital disimpaction. -SOMG enema today.  Diabetes type 2 uncontrolled - Resistant SSI  -May have to restart some of patient's home dose of Lantus   Atrial fibrillation  -Rate controlled, continue Coumadin, pharmacy to adjust Coumadin dose.  HTN -Cardizem 120 mg Daily  -Hold Cozaar secondary to patient's increasing creatinine   Acute on chronic renal failure - in the setting of poor po intake, UTI -Gently hydrate  - improving this morning  Abdominal hernia    - discussed case with surgery over the phone, hernia without any acute needs, patient can follow up with surgery in clinic in the future and once acute issues resolve   Hx pulmonary embolism -Patient on chronic anticoagulation   Hyperthyroidism -Daily methimazole  -Obtain TSH/free T4/free T3  Diet: clear liquid Fluids: NS at 75 cc/h DVT Prophylaxis: Coumadin  Code Status: DNR Family Communication: d/w patient and daughter   Disposition Plan: inpatient  Consultants:  Urology  Surgery (phone)  Procedures:  none   Antibiotics Levofloxacin 3/30 >> 08/17/2013. Ciprofloxacin for 4/1   Objective: Filed Vitals:   08/16/13 2044 08/16/13 2204 08/17/13 0521 08/17/13 0957  BP:  115/57 132/68 128/83  Pulse: 71 72 82 85  Temp:  97.6 F (36.4 C) 97.7 F (36.5 C)   TempSrc:  Oral Oral   Resp: 16 16 18    Height:      Weight:      SpO2: 97% 94% 93%     Intake/Output Summary (Last 24 hours) at 08/17/13 1311 Last data filed at 08/17/13 0920  Gross per 24 hour  Intake    480 ml  Output   2000 ml  Net  -1520 ml   Filed Weights   08/15/13 1138 08/16/13 0551  Weight: 97.523 kg (215 lb) 102.8 kg (226 lb 10.1 oz)    Exam:   General:  NAD  Cardiovascular: regular rate and rhythm, without MRG  Respiratory: good air movement, clear to auscultation throughout, no wheezing, ronchi or rales  Abdomen: soft, tender to palpation RLQ  MSK: trace peripheral edema  Neuro: non focal  Data Reviewed: Basic Metabolic Panel:  Recent Labs Lab 08/15/13 1205 08/15/13 2235 08/16/13 0640 08/17/13 0550  NA 138  --  138 136*  K 5.0  --  4.3 4.1  CL 97  --  96 97  CO2 24  --  28 25  GLUCOSE 198*  --  103* 209*  BUN 64*  --  61* 49*  CREATININE 2.81* 2.83* 2.67* 2.18*  CALCIUM 9.1  --  8.8 8.3*  MG  --   --  3.6* 3.3*   Liver Function Tests:  Recent Labs Lab 08/15/13 1205 08/16/13 0640 08/17/13 0550  AST 13 13 15   ALT 8 7 7   ALKPHOS 128* 116 110  BILITOT 0.3 0.3  0.3  PROT 7.6 6.9 6.4  ALBUMIN 3.4* 2.9* 2.7*    Recent Labs Lab 08/15/13 1205  LIPASE 18   CBC:  Recent Labs Lab 08/15/13 1205 08/15/13 2235 08/16/13 0640 08/17/13 0550  WBC 16.4* 11.7* 11.0* 13.7*  NEUTROABS 13.1*  --  7.1 11.3*  HGB 10.9* 10.1* 10.1* 9.9*  HCT 33.5* 30.8* 31.3* 29.6*  MCV 83.3 82.6 82.8 82.2  PLT 370 318 322 301   Cardiac Enzymes:  Recent Labs Lab 08/15/13 1610  TROPONINI <0.30   BNP (last 3 results)  Recent Labs  02/28/13 2030 08/15/13 2235  PROBNP 478.4* 284.1   CBG:  Recent Labs Lab 08/16/13 2046 08/16/13 2352 08/17/13 0401 08/17/13 0822 08/17/13 1202  GLUCAP 165* 113* 239* 145* 181*    Recent Results (from the past 240 hour(s))  URINE CULTURE     Status: None   Collection Time    08/15/13 12:32 PM      Result Value Ref Range Status   Specimen Description URINE, SUPRAPUBIC   Final   Special Requests Normal   Final   Culture  Setup Time     Final   Value: 08/15/2013 17:46     Performed at [facility] Count     Final   Value: >=100,000 COLONIES/ML     Performed at [facility]   Culture     Final   Value: SERRATIA MARCESCENS     Performed at [facility]   Report Status 08/17/2013 FINAL   Final   Organism ID, Bacteria SERRATIA MARCESCENS   Final  URINE CULTURE     Status: None   Collection Time    08/15/13 12:32 PM      Result Value Ref Range Status   Specimen Description URINE, CATHETERIZED   Final   Special Requests NONE   Final   Culture  Setup Time     Final   Value: 08/15/2013 21:39     Performed at [facility] Count     Final   Value: >=100,000 COLONIES/ML     Performed at [facility]   Culture     Final   Value: GRAM NEGATIVE RODS     Performed at [facility]   Report Status PENDING   Incomplete     Studies: Ct Abdomen Pelvis Wo Contrast  08/15/2013   CLINICAL DATA:  Abdominal pain.  No recent bowel movement.  EXAM: CT ABDOMEN AND  PELVIS WITHOUT CONTRAST  TECHNIQUE: Multidetector CT imaging of the abdomen and pelvis was performed following the standard protocol without intravenous contrast.  COMPARISON:  12/30/2012 CT.  FINDINGS: Moderate stool rectosigmoid region. Scattered colonic diverticula without extra luminal bowel inflammatory process, free fluid or free air.  Lateral to the right rectus muscle there is bulge/ mild Spigelian hernia containing fat and small bowel loops. This does not cause obstruction.  Suprapubic catheter in place.  Left adrenal nodule consistent with small adenoma stable.  Post cholecystectomy and hysterectomy.  Taking into account limitation by non contrast imaging, No worrisome hepatic, splenic, renal, right renal or pancreatic lesion.  Atherosclerotic type changes coronary arteries, aorta and aortic branch vessels. No abdominal aortic aneurysm.  Chronic L1 anterior wedge compression deformity.  IMPRESSION: Moderate stool rectosigmoid region. Scattered colonic diverticula without extra luminal bowel inflammatory process, free fluid or free air.  Please see above.   Electronically Signed   By: Bridgett Larsson M.D.   On: 08/15/2013 16:54    Scheduled Meds: . diltiazem  120 mg Oral Q12H  . ferrous sulfate  325 mg Oral QHS  . insulin aspart  0-20 Units Subcutaneous 6 times per day  . levofloxacin (LEVAQUIN) IV  500 mg Intravenous Q48H  . methimazole  5 mg Oral Daily  . metoCLOPramide  2.5 mg Oral QID  . mirtazapine  15 mg Oral QHS  . mometasone-formoterol  2 puff Inhalation BID  . senna-docusate  1 tablet Oral BID  . simvastatin  10 mg Oral q1800  . sorbitol, milk of mag, mineral oil, glycerin (SMOG) enema  960 mL Rectal Once  . vitamin B-12  1,000 mcg Oral Daily  . warfarin  3 mg Oral ONCE-1800  . Warfarin - Pharmacist Dosing Inpatient   Does not apply q1800   Continuous Infusions: . sodium chloride 75 mL/hr (08/16/13 1144)    Principal Problem:   Sepsis Active Problems:   Urinary retention    Diabetes mellitus type 2, uncontrolled   Lower extremity edema   Atrial fibrillation   History of pulmonary embolism   HTN (hypertension)   Abdominal hernia   UTI (urinary tract infection)   Constipation   UTI (lower urinary tract infection)   Time spent: 35  This note has been created with Education officer, environmental. Any transcriptional errors are unintentional.   Pamella Pert, MD [facility] Pager [phone]. If 7 PM - 7 AM, please contact night-coverage at [email or web address], password [facility] 08/17/2013, 1:11 PM  LOS: 2 days

## 2013-08-17 NOTE — Progress Notes (Signed)
MD notified of failed smog enema. 200cc put in, pt. Unable to retain. Most of the fluid returned. Slight bleeding noted. No complaints of pain/discomfort at this time.

## 2013-08-17 NOTE — Progress Notes (Signed)
Spoke with family and patient about tobacco chew. Pt. Was discovered to have some in her mouth earlier this afternoon. MD aware, nicotine patches ordered.

## 2013-08-17 NOTE — ED Provider Notes (Signed)
Medical screening examination/treatment/procedure(s) were conducted as a shared visit with non-physician practitioner(s) and myself.  I personally evaluated the patient during the encounter.   EKG Interpretation None      patient with UTI, possibly contaminated by coming from suprapubic cath. Also with abd pain, ARF and concerns for obstruction. Plan to disimpact given her severe constipation, hydrate, treat UTI and admit.  Audree CamelScott T Ailie Gage, MD 08/17/13 1352

## 2013-08-17 NOTE — Progress Notes (Signed)
MD notified about patient's bowel movement. BM was runny, moderate amount. Pt. States "I feel Better".

## 2013-08-17 NOTE — Clinical Social Work Note (Signed)
CSW assessed patient at bedside. Full psychosocial assessment to come.  Roddie McBryant Quenisha Lovins MSW, [place]CSWA, [place], [phone]

## 2013-08-17 NOTE — Evaluation (Signed)
Physical Therapy Evaluation Patient Details Name: Krystal SmokerLouise P [name] MRN: [id number] DOB: 1925/01/19 Today's Date: 08/17/2013   History of Present Illness  80yo WF PMHx hyperthyroidism?, hypertension, GERD, diabetes uncontrolled, hyperlipidemia, coronary artery disease, vertigo presenting to the ED with abdominal pain that is been ongoing for the past several days. As per daughter's report, reported that patient is been having right lower quadrant discomfort without radiation. Reported that patient has not had a bowel movement in the past 5 days.  Clinical Impression  Pt admitted with above. Pt currently with functional limitations due to the deficits listed below (see PT Problem List).  Pt will benefit from skilled PT to increase their independence and safety with mobility to allow discharge to the venue listed below. Do not feel that pt can return home alone at this time.      Follow Up Recommendations SNF    Equipment Recommendations  None recommended by PT    Recommendations for Other Services       Precautions / Restrictions Precautions Precautions: Fall Restrictions Weight Bearing Restrictions: No      Mobility  Bed Mobility Overal bed mobility: Needs Assistance Bed Mobility: Supine to Sit     Supine to sit: Mod assist;HOB elevated     General bed mobility comments: Assist to bring trunk up.  Transfers Overall transfer level: Needs assistance Equipment used: Rolling walker (2 wheeled) Transfers: Sit to/from [employer]GI CorporationStand;Stand Pivot Transfers Sit to Stand: Min assist Stand pivot transfers: Min assist       General transfer comment: Assist to bring hips up.  Ambulation/Gait Ambulation/Gait assistance: Min assist Ambulation Distance (Feet): 2 Feet Assistive device: Rolling walker (2 wheeled) Gait Pattern/deviations: Step-to pattern;Decreased step length - right;Decreased step length - left Gait velocity: decr   General Gait Details: Pt with difficulty turning left foot  to pivot but able to step straight ahead. Verbal cues to stay closer to walker.  Stairs            Wheelchair Mobility    Modified Rankin (Stroke Patients Only)       Balance Overall balance assessment: Needs assistance Sitting-balance support: Bilateral upper extremity supported Sitting balance-Leahy Scale: Poor   Postural control: Posterior lean Standing balance support: Bilateral upper extremity supported Standing balance-Leahy Scale: Poor                       Pertinent Vitals/Pain Rt lower quadrant abdominal pain. Pt premedicated and nurse to bring additional pain meds.    Home Living Family/patient expects to be discharged to:: Private residence Living Arrangements: Alone Available Help at Discharge: Family;Personal care attendant;Available PRN/intermittently Type of Home: Apartment Home Access: Elevator     Home Layout: One level Home Equipment: Walker - 2 wheels;Tub bench;Bedside commode      Prior Function           Comments: pt does not drive and family checks in but is alone the majority of the day.      Hand Dominance        Extremity/Trunk Assessment   Upper Extremity Assessment: Generalized weakness           Lower Extremity Assessment: Generalized weakness         Communication   Communication: HOH  Cognition Arousal/Alertness: Awake/alert Behavior During Therapy: Anxious Overall Cognitive Status: Impaired/Different from baseline Area of Impairment: Orientation;Problem solving Orientation Level: Disoriented to;Place           Problem Solving: Requires verbal cues;Requires tactile cues General  Comments: At times pt thinks she is at home.    General Comments      Exercises        Assessment/Plan    PT Assessment Patient needs continued PT services  PT Diagnosis Difficulty walking;Generalized weakness   PT Problem List Decreased strength;Decreased activity tolerance;Decreased balance;Decreased  mobility;Decreased knowledge of use of DME;Decreased knowledge of precautions;Pain  PT Treatment Interventions DME instruction;Gait training;Patient/family education;Functional mobility training;Therapeutic activities;Therapeutic exercise;Balance training   PT Goals (Current goals can be found in the Care Plan section) Acute Rehab PT Goals Patient Stated Goal: Pt didn't state. Daughter feels pt needs to go to ST[facility]. PT Goal Formulation: With patient/family Time For Goal Achievement: 08/24/13 Potential to Achieve Goals: Good    Frequency Min 3X/week   Barriers to discharge Decreased caregiver support      End of Session Equipment Utilized During Treatment: Gait belt Activity Tolerance: Patient limited by fatigue;Patient limited by pain Patient left: in chair;with family/visitor present         Time: 1610-9604 PT Time Calculation (min): 18 min   Charges:   PT Evaluation $Initial PT Evaluation Tier I: 1 Procedure PT Treatments $Gait Training: 8-22 mins   PT G Codes:          Blasa Raisch 09-05-2013, 10:48 AM  Skip Mayer PT [phone]

## 2013-08-18 LAB — CBC WITH DIFFERENTIAL/PLATELET
BASOS ABS: 0 10*3/uL (ref 0.0–0.1)
Basophils Relative: 0 % (ref 0–1)
EOS PCT: 2 % (ref 0–5)
Eosinophils Absolute: 0.2 10*3/uL (ref 0.0–0.7)
HEMATOCRIT: 26.9 % — AB (ref 36.0–46.0)
Hemoglobin: 8.6 g/dL — ABNORMAL LOW (ref 12.0–15.0)
LYMPHS ABS: 1.3 10*3/uL (ref 0.7–4.0)
LYMPHS PCT: 12 % (ref 12–46)
MCH: 26.8 pg (ref 26.0–34.0)
MCHC: 32 g/dL (ref 30.0–36.0)
MCV: 83.8 fL (ref 78.0–100.0)
MONO ABS: 1.3 10*3/uL — AB (ref 0.1–1.0)
Monocytes Relative: 11 % (ref 3–12)
NEUTROS ABS: 8.3 10*3/uL — AB (ref 1.7–7.7)
Neutrophils Relative %: 75 % (ref 43–77)
Platelets: 267 10*3/uL (ref 150–400)
RBC: 3.21 MIL/uL — AB (ref 3.87–5.11)
RDW: 14.8 % (ref 11.5–15.5)
WBC: 11 10*3/uL — AB (ref 4.0–10.5)

## 2013-08-18 LAB — GLUCOSE, CAPILLARY
GLUCOSE-CAPILLARY: 149 mg/dL — AB (ref 70–99)
GLUCOSE-CAPILLARY: 202 mg/dL — AB (ref 70–99)
Glucose-Capillary: 105 mg/dL — ABNORMAL HIGH (ref 70–99)
Glucose-Capillary: 70 mg/dL (ref 70–99)

## 2013-08-18 LAB — URINE CULTURE

## 2013-08-18 LAB — BASIC METABOLIC PANEL
BUN: 31 mg/dL — ABNORMAL HIGH (ref 6–23)
CHLORIDE: 100 meq/L (ref 96–112)
CO2: 28 mEq/L (ref 19–32)
Calcium: 8.1 mg/dL — ABNORMAL LOW (ref 8.4–10.5)
Creatinine, Ser: 1.68 mg/dL — ABNORMAL HIGH (ref 0.50–1.10)
GFR calc Af Amer: 30 mL/min — ABNORMAL LOW (ref >90)
GFR, EST NON AFRICAN AMERICAN: 26 mL/min — AB (ref >90)
GLUCOSE: 137 mg/dL — AB (ref 70–99)
POTASSIUM: 4 meq/L (ref 3.7–5.3)
SODIUM: 137 meq/L (ref 137–147)

## 2013-08-18 LAB — MAGNESIUM: MAGNESIUM: 2.8 mg/dL — AB (ref 1.5–2.5)

## 2013-08-18 LAB — PROTIME-INR
INR: 2.9 — ABNORMAL HIGH (ref 0.00–1.49)
Prothrombin Time: 29.3 seconds — ABNORMAL HIGH (ref 11.6–15.2)

## 2013-08-18 MED ORDER — CIPROFLOXACIN HCL 500 MG PO TABS: 500.0000 mg | ORAL_TABLET | ORAL | Status: DC

## 2013-08-18 MED ORDER — WARFARIN SODIUM 2 MG PO TABS
2.0000 mg | ORAL_TABLET | Freq: Once | ORAL | Status: DC
  Filled 2013-08-18: qty 1

## 2013-08-18 NOTE — Clinical Social Work Psychosocial (Addendum)
Clinical Social Work Department BRIEF PSYCHOSOCIAL ASSESSMENT 08/18/2013  Patient:  Krystal [name], Krystal [name]     Account Number:  [id number]     Admit date:  08/15/2013  Clinical Social Worker:  Lovey Newcomer  Date/Time:  08/17/2013 01:00 PM  Referred by:  Physician  Date Referred:  08/17/2013 Referred for  SNF Placement   Other Referral:   Interview type:  Patient Other interview type:   Patient and daughter interviewed at bedside.    PSYCHOSOCIAL DATA Living Status:  ALONE Admitted from facility:   Level of care:   Primary support name:  Vaughan Basta and Bethena Roys Primary support relationship to patient:  CHILD, ADULT Degree of support available:   Support is adequate.    CURRENT CONCERNS Current Concerns  Post-Acute Placement   Other Concerns:    SOCIAL WORK ASSESSMENT / PLAN CSW met with patient at bedside to complete assessment. Daughter also at bedside for assessment. CSW explained that PT has recommended SNF for patient. Patient is adamantly refusing SNF at this time. CSW inquired about patient thoughts of rehab. Patient states that she is more comfortable at home and will be fine there. Patient seemed very annoyed by the conversation around SNF placement. Patient's daughter states that she will try to speak with patient and convince her to go to SNF. Patient is from home alone.      Daughter states that patient does receive 80 hrs. of PCS services at home per month which equals out to about 20 a week. Patient also uses a walker to get around at home.   Assessment/plan status:  Psychosocial Support/Ongoing Assessment of Needs Other assessment/ plan:   Information/referral to community resources:   CSW contact information and SNF list given to patient.    PATIENT'S/FAMILY'S RESPONSE TO PLAN OF CARE: Patient adamantly refuses SNF placement. Patient seemed annoyed by CSW's visit. Daughters would like patient to go to SNF, but they are fully aware that the patient refuses.        Liz Beach MSW, [employer] [facility], [place], [phone]

## 2013-08-18 NOTE — Progress Notes (Signed)
ANTICOAGULATION CONSULT NOTE - Follow Up Consult  Pharmacy Consult for Coumadin Indication: atrial fibrillation and hx DVT/PE  Allergies  Allergen Reactions  . Humibid La [Guaifenesin] Shortness Of Breath  . Latex Hives  . Detrol [Tolterodine Tartrate] Hives  . Keflex [Cephalexin]     hives  . Vioxx [Rofecoxib] Hives    Patient Measurements: Height: 5' (152.4 cm) Weight: 226 lb 10.1 oz (102.8 kg) IBW/kg (Calculated) : 45.5  Vital Signs: Temp: 98.4 F (36.9 C) (04/02 0700) Temp src: Oral (04/02 0700) BP: 115/69 mmHg (04/02 0700) Pulse Rate: 90 (04/02 0700)  Labs:  Recent Labs  08/15/13 1205 08/15/13 1610  08/16/13 0640 08/17/13 0550 08/18/13 0820  HGB 10.9*  --   < > 10.1* 9.9* 8.6*  HCT 33.5*  --   < > 31.3* 29.6* 26.9*  PLT 370  --   < > 322 301 267  APTT 44*  --   --   --   --   --   LABPROT 30.2*  --   --  27.1* 25.4* 29.3*  INR 3.02*  --   --  2.62* 2.40* 2.90*  CREATININE 2.81*  --   < > 2.67* 2.18* 1.68*  TROPONINI  --  <0.30  --   --   --   --   < > = values in this interval not displayed.  Estimated Creatinine Clearance: 25.5 ml/min (by C-G formula based on Cr of 1.68).  Assessment: 80 yo F on Coumadin PTA with therapeutic INR on home regimen.     Home Coumadin regimen: 2 mg TTSun, 3 mg MWFSat.  Would recommend close outpt INR monitoring while on antibiotics for UTI.   Goal of Therapy:  INR 2-3 Monitor platelets by anticoagulation protocol: Yes   Plan:   Coumadin 2 mg today.  Usual Thursday dose.  Daily PT/INR.  Will watch for possible effect on Cipro.    Toys 'R' UsKimberly Iverna Hammac, Pharm.D., BCPS Clinical Pharmacist Pager [phone] 08/18/2013 12:04 PM

## 2013-08-18 NOTE — Progress Notes (Signed)
Patient was discharged home by MD order; discharged instructions  review and give to patient and daughter with care notes and prescriptions; IV DIC; skin - dressing on sacral area changed; patient will be escorted to the car by nurse tech via wheelchair.

## 2013-08-18 NOTE — Discharge Summary (Signed)
Physician Discharge Summary  Krystal [name] JXB:[id number] DOB: 07-06-1924 DOA: 08/15/2013  PCP: Mickie Hillier, MD  Admit date: 08/15/2013 Discharge date: 08/18/2013  Time spent: 40 minutes  Recommendations for Outpatient Follow-up:  1. Followup with primary care physician in one week. 2. INR to be done on Monday, patient will have home health services including PT/OT/RN  Discharge Diagnoses:  Principal Problem:   Sepsis Active Problems:   Urinary retention   Diabetes mellitus type 2, uncontrolled   Lower extremity edema   Atrial fibrillation   History of pulmonary embolism   HTN (hypertension)   Abdominal hernia   UTI (urinary tract infection)   Constipation   UTI (lower urinary tract infection)   Discharge Condition: Stable  Diet recommendation: Carb modified diet  Filed Weights   08/15/13 1138 08/16/13 0551  Weight: 97.523 kg (215 lb) 102.8 kg (226 lb 10.1 oz)    History of present illness:  Krystal [name] 15YQ WF PMHx hyperthyroidism?, hypertension, GERD, diabetes uncontrolled, hyperlipidemia, coronary artery disease, vertigo presenting to the ED with abdominal pain that is been ongoing for the past several days. As per daughter's report, reported that patient is been having right lower quadrant discomfort without radiation. Reported that patient has not had a bowel movement in the past 5 days. Stated that milk of magnesia, laxatives, increase in hydration have been given to the patient with no relief. Daughter reported that patient had a similar episode approximately 5 years ago where she needs to be admitted to the hospital. Reported that patient was by herself with a home health aid-patient is suprapubic catheter. Denied back pain, chest pain, shortness of breath, difficulty breathing, headache, dizziness, changes to eating, weakness. Disorder denied altered mental status.   Hospital Course:   Serratia marcescens UTI  -Complicated UTI, related to suprapubic catheter.  On admission urinalysis was consistent with UTI.  -On levofloxacin, changed to oral ciprofloxacin.  -Suprapubic catheter was changed yesterday by urology.  -Discharge on ciprofloxacin 500 mg by mouth twice a day for 10 more days.  Constipation  -Patient had Fleet/tapwater enema yesterday without success, had to do digital disimpaction.  -SOMG enema administered, patient had large bowel movement. -Have asked patient to drink plenty of water and take OTC stool softeners.   Diabetes type 2 uncontrolled - Resistant SSI  -Home regimen restarted on discharge. Control diabetes mellitus with hemoglobin A1c of 6.6. -October 2014 hemoglobin A1c was 12.4.  Atrial fibrillation  -Rate controlled, continue Coumadin, pharmacy to adjust Coumadin dose.   HTN -Cardizem 120 mg Daily  -Hold Cozaar secondary to patient's increasing creatinine   Acute on chronic renal failure - in the setting of poor po intake, UTI  -Gently hydrate  - improving this morning   Abdominal hernia  -Discussed case with surgery over the phone, hernia without any acute needs, patient can follow up with surgery in clinic in the future and once acute issues resolve.  Hx pulmonary embolism -Patient on chronic anticoagulation   Hyperthyroidism -Daily methimazole  -Obtain TSH/free T4/free T3   Procedures: Changing the suprapubic catheter done by urology PA on 08/16/2013. Initially the floor nurses tried to change the catheter were not able to do that than the consulted a nurse from [address] female nurse from [address] was not able to continue catheter and he recommended urology consultation. That was communicated to my colleague Dr. Elvera Lennox, who consulted urology in the morning. Urology PA came in to change the catheter, the suprapubic tube was on the floor.  Because of Latex allergy nonlatex catheter was ordered and central supply sent latex catheter. Then nursing staff has to hunt down latex catheter from the ER. After that catheter was  placed and everything was back to normal.  Consultations:  None  Discharge Exam: Filed Vitals:   08/18/13 0700  BP: 115/69  Pulse: 90  Temp: 98.4 F (36.9 C)  Resp: 20   General: Alert and awake, oriented x3, not in any acute distress. HEENT: anicteric sclera, pupils reactive to light and accommodation, EOMI CVS: S1-S2 clear, no murmur rubs or gallops Chest: clear to auscultation bilaterally, no wheezing, rales or rhonchi Abdomen: soft nontender, nondistended, normal bowel sounds, no organomegaly Extremities: no cyanosis, clubbing or edema noted bilaterally Neuro: Cranial nerves II-XII intact, no focal neurological deficits  Discharge Instructions  Discharge Orders   Future Orders Complete By Expires   Diet Carb Modified  As directed    Increase activity slowly  As directed        Medication List         ALPRAZolam 0.25 MG tablet  Commonly known as:  XANAX  Take 0.125-0.25 mg by mouth 2 (two) times daily as needed for sleep or anxiety (0.5-1 tablet).     cetirizine 10 MG chewable tablet  Commonly known as:  ZYRTEC  Chew 10 mg by mouth daily.     ciprofloxacin 500 MG tablet  Commonly known as:  CIPRO  Take 1 tablet (500 mg total) by mouth daily.     diltiazem 120 MG tablet  Commonly known as:  CARDIZEM  Take 120 mg by mouth 2 (two) times daily.     ferrous sulfate 325 (65 FE) MG tablet  Take 325 mg by mouth at bedtime.     Fluticasone-Salmeterol 250-50 MCG/DOSE Aepb  Commonly known as:  ADVAIR  Inhale 1 puff into the lungs 2 (two) times daily as needed (shortness of breath).     furosemide 40 MG tablet  Commonly known as:  LASIX  Take 60 mg by mouth daily. Take 1.5 tablets     insulin glargine 100 UNIT/ML injection  Commonly known as:  LANTUS  Inject 0.45 mLs (45 Units total) into the skin at bedtime.     losartan 100 MG tablet  Commonly known as:  COZAAR  Take 100 mg by mouth daily.     methimazole 10 MG tablet  Commonly known as:  TAPAZOLE   Take 5 mg by mouth daily.     metoCLOPramide 5 MG tablet  Commonly known as:  REGLAN  Take 2.5 mg by mouth 4 (four) times daily. 1/2 tablet 4 times daily     mirtazapine 15 MG tablet  Commonly known as:  REMERON  Take 15 mg by mouth at bedtime.     MYRBETRIQ 25 MG Tb24 tablet  Generic drug:  mirabegron ER  Take 25 mg by mouth daily.     oxybutynin 5 MG 24 hr tablet  Commonly known as:  DITROPAN-XL  Take 5 mg by mouth 3 (three) times daily.     potassium chloride SA 20 MEQ tablet  Commonly known as:  K-DUR,KLOR-CON  Take 10 mEq by mouth 2 (two) times daily.     pravastatin 20 MG tablet  Commonly known as:  PRAVACHOL  Take 20 mg by mouth daily.     sitaGLIPtin 50 MG tablet  Commonly known as:  JANUVIA  Take 50 mg by mouth daily.     SYSTANE OP  Apply 1 drop to eye  daily as needed (dry eye).     vitamin B-12 1000 MCG tablet  Commonly known as:  CYANOCOBALAMIN  Take 1,000 mcg by mouth daily.     warfarin 2 MG tablet  Commonly known as:  COUMADIN  Take 2-3 mg by mouth daily. 2 mg on sun, tue, thur and 3 mg all other days       Allergies  Allergen Reactions  . Humibid La [Guaifenesin] Shortness Of Breath  . Latex Hives  . Detrol [Tolterodine Tartrate] Hives  . Keflex [Cephalexin]     hives  . Vioxx [Rofecoxib] Hives       Follow-up Information   Follow up with Mickie Hillier, MD In 1 week.   Specialty:  Family Medicine   Contact information:   [address] [place] [address] [phone]        The results of significant diagnostics from this hospitalization (including imaging, microbiology, ancillary and laboratory) are listed below for reference.    Significant Diagnostic Studies: Ct Abdomen Pelvis Wo Contrast  08/15/2013   CLINICAL DATA:  Abdominal pain.  No recent bowel movement.  EXAM: CT ABDOMEN AND PELVIS WITHOUT CONTRAST  TECHNIQUE: Multidetector CT imaging of the abdomen and pelvis was performed following the standard protocol  without intravenous contrast.  COMPARISON:  12/30/2012 CT.  FINDINGS: Moderate stool rectosigmoid region. Scattered colonic diverticula without extra luminal bowel inflammatory process, free fluid or free air.  Lateral to the right rectus muscle there is bulge/ mild Spigelian hernia containing fat and small bowel loops. This does not cause obstruction.  Suprapubic catheter in place.  Left adrenal nodule consistent with small adenoma stable.  Post cholecystectomy and hysterectomy.  Taking into account limitation by non contrast imaging, No worrisome hepatic, splenic, renal, right renal or pancreatic lesion.  Atherosclerotic type changes coronary arteries, aorta and aortic branch vessels. No abdominal aortic aneurysm.  Chronic L1 anterior wedge compression deformity.  IMPRESSION: Moderate stool rectosigmoid region. Scattered colonic diverticula without extra luminal bowel inflammatory process, free fluid or free air.  Please see above.   Electronically Signed   By: Bridgett Larsson M.D.   On: 08/15/2013 16:54    Microbiology: Recent Results (from the past 240 hour(s))  URINE CULTURE     Status: None   Collection Time    08/15/13 12:32 PM      Result Value Ref Range Status   Specimen Description URINE, SUPRAPUBIC   Final   Special Requests Normal   Final   Culture  Setup Time     Final   Value: 08/15/2013 17:46     Performed at [facility]   Colony Count     Final   Value: >=100,000 COLONIES/ML     Performed at [facility]   Culture     Final   Value: SERRATIA MARCESCENS     Performed at [facility]   Report Status 08/17/2013 FINAL   Final   Organism ID, Bacteria SERRATIA MARCESCENS   Final  URINE CULTURE     Status: None   Collection Time    08/15/13 12:32 PM      Result Value Ref Range Status   Specimen Description URINE, CATHETERIZED   Final   Special Requests NONE   Final   Culture  Setup Time     Final   Value: 08/15/2013 21:39     Performed at [facility] Count     Final   Value: >=100,000  COLONIES/ML     [facility]formed at [facility]   Culture     Final   Value: SERRATIA MARCESCENS     Performed at [facility]   Report Status 08/18/2013 FINAL   Final   Organism ID, Bacteria SERRATIA MARCESCENS   Final     Labs: Basic Metabolic Panel:  Recent Labs Lab 08/15/13 1205 08/15/13 2235 08/16/13 0640 08/17/13 0550 08/18/13 0820  NA 138  --  138 136* 137  K 5.0  --  4.3 4.1 4.0  CL 97  --  96 97 100  CO2 24  --  28 25 28   GLUCOSE 198*  --  103* 209* 137*  BUN 64*  --  61* 49* 31*  CREATININE 2.81* 2.83* 2.67* 2.18* 1.68*  CALCIUM 9.1  --  8.8 8.3* 8.1*  MG  --   --  3.6* 3.3* 2.8*   Liver Function Tests:  Recent Labs Lab 08/15/13 1205 08/16/13 0640 08/17/13 0550  AST 13 13 15   ALT 8 7 7   ALKPHOS 128* 116 110  BILITOT 0.3 0.3 0.3  PROT 7.6 6.9 6.4  ALBUMIN 3.4* 2.9* 2.7*    Recent Labs Lab 08/15/13 1205  LIPASE 18   No results found for this basename: AMMONIA,  in the last 168 hours CBC:  Recent Labs Lab 08/15/13 1205 08/15/13 2235 08/16/13 0640 08/17/13 0550 08/18/13 0820  WBC 16.4* 11.7* 11.0* 13.7* 11.0*  NEUTROABS 13.1*  --  7.1 11.3* 8.3*  HGB 10.9* 10.1* 10.1* 9.9* 8.6*  HCT 33.5* 30.8* 31.3* 29.6* 26.9*  MCV 83.3 82.6 82.8 82.2 83.8  PLT 370 318 322 301 267   Cardiac Enzymes:  Recent Labs Lab 08/15/13 1610  TROPONINI <0.30   BNP: BNP (last 3 results)  Recent Labs  02/28/13 2030 08/15/13 2235  PROBNP 478.4* 284.1   CBG:  Recent Labs Lab 08/17/13 2017 08/18/13 0003 08/18/13 0327 08/18/13 0746 08/18/13 1212  GLUCAP 136* 70 105* 149* 202*       Signed:  Suan Pyeatt A  [facility] 08/18/2013, 12:28 PM

## 2013-08-19 NOTE — Progress Notes (Signed)
08/19/13 CSW received voicemail from patient's daughter Rica RecordsLlinda Stainback [phone] stating that patient is now interested in SNF. I contacted Lupita LeashDonna with [facility] and relayed that per daughter patient interested in SNF. HHRN will be seeing patient on 08/22/13 and then they will have a Child psychotherapistsocial worker see them for SNF placement. Contacted Ms Christean GriefStainback and informed [facility] and then a Child psychotherapistsocial worker from [facility] would be seeing her mom and start the SNF process on Monday. Jacquelynn CreeMary Marquel Pottenger RN, BSN CCM

## 2013-08-22 ENCOUNTER — Emergency Department (HOSPITAL_COMMUNITY): Payer: Medicare Other

## 2013-08-22 ENCOUNTER — Inpatient Hospital Stay (HOSPITAL_COMMUNITY)
Admission: EM | Admit: 2013-08-22 | Discharge: 2013-09-01 | DRG: 388 | Disposition: A | Discharge status: Skilled Nursing Facility | Payer: Medicare Other | Attending: Internal Medicine | Admitting: Internal Medicine

## 2013-08-22 ENCOUNTER — Encounter (HOSPITAL_COMMUNITY): Payer: Self-pay | Admitting: Emergency Medicine

## 2013-08-22 DIAGNOSIS — Z9849 Cataract extraction status, unspecified eye: Secondary | ICD-10-CM

## 2013-08-22 DIAGNOSIS — K219 Gastro-esophageal reflux disease without esophagitis: Secondary | ICD-10-CM | POA: Diagnosis present

## 2013-08-22 DIAGNOSIS — L02419 Cutaneous abscess of limb, unspecified: Secondary | ICD-10-CM | POA: Diagnosis present

## 2013-08-22 DIAGNOSIS — I2510 Atherosclerotic heart disease of native coronary artery without angina pectoris: Secondary | ICD-10-CM

## 2013-08-22 DIAGNOSIS — Z9089 Acquired absence of other organs: Secondary | ICD-10-CM

## 2013-08-22 DIAGNOSIS — D72829 Elevated white blood cell count, unspecified: Secondary | ICD-10-CM

## 2013-08-22 DIAGNOSIS — Z8249 Family history of ischemic heart disease and other diseases of the circulatory system: Secondary | ICD-10-CM

## 2013-08-22 DIAGNOSIS — E785 Hyperlipidemia, unspecified: Secondary | ICD-10-CM | POA: Diagnosis present

## 2013-08-22 DIAGNOSIS — E876 Hypokalemia: Secondary | ICD-10-CM | POA: Diagnosis present

## 2013-08-22 DIAGNOSIS — Z881 Allergy status to other antibiotic agents status: Secondary | ICD-10-CM

## 2013-08-22 DIAGNOSIS — E440 Moderate protein-calorie malnutrition: Secondary | ICD-10-CM | POA: Diagnosis present

## 2013-08-22 DIAGNOSIS — I2782 Chronic pulmonary embolism: Secondary | ICD-10-CM | POA: Diagnosis present

## 2013-08-22 DIAGNOSIS — F17200 Nicotine dependence, unspecified, uncomplicated: Secondary | ICD-10-CM | POA: Diagnosis present

## 2013-08-22 DIAGNOSIS — E669 Obesity, unspecified: Secondary | ICD-10-CM

## 2013-08-22 DIAGNOSIS — Z6841 Body Mass Index (BMI) 40.0 and over, adult: Secondary | ICD-10-CM

## 2013-08-22 DIAGNOSIS — H9190 Unspecified hearing loss, unspecified ear: Secondary | ICD-10-CM | POA: Diagnosis present

## 2013-08-22 DIAGNOSIS — Z86711 Personal history of pulmonary embolism: Secondary | ICD-10-CM

## 2013-08-22 DIAGNOSIS — Z7901 Long term (current) use of anticoagulants: Secondary | ICD-10-CM

## 2013-08-22 DIAGNOSIS — M1990 Unspecified osteoarthritis, unspecified site: Secondary | ICD-10-CM | POA: Diagnosis present

## 2013-08-22 DIAGNOSIS — R112 Nausea with vomiting, unspecified: Secondary | ICD-10-CM | POA: Diagnosis not present

## 2013-08-22 DIAGNOSIS — IMO0001 Reserved for inherently not codable concepts without codable children: Secondary | ICD-10-CM | POA: Diagnosis present

## 2013-08-22 DIAGNOSIS — Z888 Allergy status to other drugs, medicaments and biological substances status: Secondary | ICD-10-CM

## 2013-08-22 DIAGNOSIS — R609 Edema, unspecified: Secondary | ICD-10-CM

## 2013-08-22 DIAGNOSIS — I10 Essential (primary) hypertension: Secondary | ICD-10-CM

## 2013-08-22 DIAGNOSIS — R141 Gas pain: Secondary | ICD-10-CM | POA: Diagnosis present

## 2013-08-22 DIAGNOSIS — Z79899 Other long term (current) drug therapy: Secondary | ICD-10-CM

## 2013-08-22 DIAGNOSIS — R143 Flatulence: Secondary | ICD-10-CM

## 2013-08-22 DIAGNOSIS — E1165 Type 2 diabetes mellitus with hyperglycemia: Secondary | ICD-10-CM | POA: Diagnosis present

## 2013-08-22 DIAGNOSIS — M79609 Pain in unspecified limb: Secondary | ICD-10-CM

## 2013-08-22 DIAGNOSIS — N184 Chronic kidney disease, stage 4 (severe): Secondary | ICD-10-CM

## 2013-08-22 DIAGNOSIS — Z86718 Personal history of other venous thrombosis and embolism: Secondary | ICD-10-CM

## 2013-08-22 DIAGNOSIS — R600 Localized edema: Secondary | ICD-10-CM

## 2013-08-22 DIAGNOSIS — I4891 Unspecified atrial fibrillation: Secondary | ICD-10-CM

## 2013-08-22 DIAGNOSIS — E0590 Thyrotoxicosis, unspecified without thyrotoxic crisis or storm: Secondary | ICD-10-CM | POA: Diagnosis present

## 2013-08-22 DIAGNOSIS — D638 Anemia in other chronic diseases classified elsewhere: Secondary | ICD-10-CM

## 2013-08-22 DIAGNOSIS — R142 Eructation: Secondary | ICD-10-CM

## 2013-08-22 DIAGNOSIS — K56609 Unspecified intestinal obstruction, unspecified as to partial versus complete obstruction: Principal | ICD-10-CM

## 2013-08-22 DIAGNOSIS — I129 Hypertensive chronic kidney disease with stage 1 through stage 4 chronic kidney disease, or unspecified chronic kidney disease: Secondary | ICD-10-CM | POA: Diagnosis present

## 2013-08-22 DIAGNOSIS — Z91040 Latex allergy status: Secondary | ICD-10-CM

## 2013-08-22 DIAGNOSIS — IMO0002 Reserved for concepts with insufficient information to code with codable children: Secondary | ICD-10-CM | POA: Diagnosis present

## 2013-08-22 DIAGNOSIS — N390 Urinary tract infection, site not specified: Secondary | ICD-10-CM

## 2013-08-22 DIAGNOSIS — Z66 Do not resuscitate: Secondary | ICD-10-CM | POA: Diagnosis not present

## 2013-08-22 DIAGNOSIS — Z794 Long term (current) use of insulin: Secondary | ICD-10-CM

## 2013-08-22 DIAGNOSIS — K5900 Constipation, unspecified: Secondary | ICD-10-CM

## 2013-08-22 DIAGNOSIS — G9340 Encephalopathy, unspecified: Secondary | ICD-10-CM

## 2013-08-22 DIAGNOSIS — R109 Unspecified abdominal pain: Secondary | ICD-10-CM | POA: Diagnosis present

## 2013-08-22 DIAGNOSIS — L03119 Cellulitis of unspecified part of limb: Secondary | ICD-10-CM

## 2013-08-22 DIAGNOSIS — E039 Hypothyroidism, unspecified: Secondary | ICD-10-CM | POA: Diagnosis present

## 2013-08-22 DIAGNOSIS — F0390 Unspecified dementia without behavioral disturbance: Secondary | ICD-10-CM | POA: Diagnosis present

## 2013-08-22 LAB — URINALYSIS, ROUTINE W REFLEX MICROSCOPIC
Bilirubin Urine: NEGATIVE
Glucose, UA: NEGATIVE mg/dL
HGB URINE DIPSTICK: NEGATIVE
Ketones, ur: NEGATIVE mg/dL
Nitrite: NEGATIVE
PH: 5 (ref 5.0–8.0)
Protein, ur: NEGATIVE mg/dL
SPECIFIC GRAVITY, URINE: 1.018 (ref 1.005–1.030)
UROBILINOGEN UA: 0.2 mg/dL (ref 0.0–1.0)

## 2013-08-22 LAB — CBC WITH DIFFERENTIAL/PLATELET
BASOS ABS: 0 10*3/uL (ref 0.0–0.1)
Basophils Absolute: 0 10*3/uL (ref 0.0–0.1)
Basophils Relative: 0 % (ref 0–1)
Basophils Relative: 0 % (ref 0–1)
EOS ABS: 0.4 10*3/uL (ref 0.0–0.7)
EOS PCT: 3 % (ref 0–5)
Eosinophils Absolute: 0.3 10*3/uL (ref 0.0–0.7)
Eosinophils Relative: 3 % (ref 0–5)
HCT: 31.6 % — ABNORMAL LOW (ref 36.0–46.0)
HEMATOCRIT: 28.5 % — AB (ref 36.0–46.0)
HEMOGLOBIN: 10.2 g/dL — AB (ref 12.0–15.0)
Hemoglobin: 9.3 g/dL — ABNORMAL LOW (ref 12.0–15.0)
LYMPHS ABS: 1.8 10*3/uL (ref 0.7–4.0)
LYMPHS ABS: 3 10*3/uL (ref 0.7–4.0)
LYMPHS PCT: 16 % (ref 12–46)
LYMPHS PCT: 26 % (ref 12–46)
MCH: 26.4 pg (ref 26.0–34.0)
MCH: 26.6 pg (ref 26.0–34.0)
MCHC: 32.3 g/dL (ref 30.0–36.0)
MCHC: 32.6 g/dL (ref 30.0–36.0)
MCV: 81.7 fL (ref 78.0–100.0)
MCV: 81.7 fL (ref 78.0–100.0)
Monocytes Absolute: 1.2 10*3/uL — ABNORMAL HIGH (ref 0.1–1.0)
Monocytes Absolute: 1.3 10*3/uL — ABNORMAL HIGH (ref 0.1–1.0)
Monocytes Relative: 11 % (ref 3–12)
Monocytes Relative: 11 % (ref 3–12)
NEUTROS ABS: 7.5 10*3/uL (ref 1.7–7.7)
NEUTROS ABS: 7.1 10*3/uL (ref 1.7–7.7)
Neutrophils Relative %: 69 % (ref 43–77)
Neutrophils Relative %: 60 % (ref 43–77)
PLATELETS: 385 10*3/uL (ref 150–400)
Platelets: 369 10*3/uL (ref 150–400)
RBC: 3.87 MIL/uL (ref 3.87–5.11)
RBC: 3.49 MIL/uL — AB (ref 3.87–5.11)
RDW: 14.7 % (ref 11.5–15.5)
RDW: 14.7 % (ref 11.5–15.5)
WBC: 10.9 10*3/uL — AB (ref 4.0–10.5)
WBC: 11.7 10*3/uL — AB (ref 4.0–10.5)

## 2013-08-22 LAB — BASIC METABOLIC PANEL
BUN: 28 mg/dL — ABNORMAL HIGH (ref 6–23)
CO2: 28 meq/L (ref 19–32)
Calcium: 8.7 mg/dL (ref 8.4–10.5)
Chloride: 94 mEq/L — ABNORMAL LOW (ref 96–112)
Creatinine, Ser: 1.94 mg/dL — ABNORMAL HIGH (ref 0.50–1.10)
GFR calc Af Amer: 26 mL/min — ABNORMAL LOW (ref >90)
GFR, EST NON AFRICAN AMERICAN: 22 mL/min — AB (ref >90)
Glucose, Bld: 164 mg/dL — ABNORMAL HIGH (ref 70–99)
Potassium: 4.2 mEq/L (ref 3.7–5.3)
SODIUM: 136 meq/L — AB (ref 137–147)

## 2013-08-22 LAB — URINE MICROSCOPIC-ADD ON

## 2013-08-22 LAB — COMPREHENSIVE METABOLIC PANEL
ALBUMIN: 2.9 g/dL — AB (ref 3.5–5.2)
ALT: 13 U/L (ref 0–35)
AST: 22 U/L (ref 0–37)
Alkaline Phosphatase: 96 U/L (ref 39–117)
BUN: 28 mg/dL — ABNORMAL HIGH (ref 6–23)
CO2: 26 mEq/L (ref 19–32)
Calcium: 8.2 mg/dL — ABNORMAL LOW (ref 8.4–10.5)
Chloride: 95 mEq/L — ABNORMAL LOW (ref 96–112)
Creatinine, Ser: 1.86 mg/dL — ABNORMAL HIGH (ref 0.50–1.10)
GFR calc non Af Amer: 23 mL/min — ABNORMAL LOW (ref >90)
GFR, EST AFRICAN AMERICAN: 27 mL/min — AB (ref >90)
Glucose, Bld: 130 mg/dL — ABNORMAL HIGH (ref 70–99)
Potassium: 3.9 mEq/L (ref 3.7–5.3)
SODIUM: 135 meq/L — AB (ref 137–147)
TOTAL PROTEIN: 6.9 g/dL (ref 6.0–8.3)
Total Bilirubin: 0.3 mg/dL (ref 0.3–1.2)

## 2013-08-22 LAB — PROTIME-INR
INR: 3.33 — ABNORMAL HIGH (ref 0.00–1.49)
INR: 3.2 — ABNORMAL HIGH (ref 0.00–1.49)
PROTHROMBIN TIME: 32.6 s — AB (ref 11.6–15.2)
Prothrombin Time: 31.6 seconds — ABNORMAL HIGH (ref 11.6–15.2)

## 2013-08-22 LAB — GLUCOSE, CAPILLARY
GLUCOSE-CAPILLARY: 180 mg/dL — AB (ref 70–99)
Glucose-Capillary: 119 mg/dL — ABNORMAL HIGH (ref 70–99)
Glucose-Capillary: 137 mg/dL — ABNORMAL HIGH (ref 70–99)

## 2013-08-22 LAB — HEMOGLOBIN A1C
Hgb A1c MFr Bld: 6.4 % — ABNORMAL HIGH (ref <5.7)
Mean Plasma Glucose: 137 mg/dL — ABNORMAL HIGH (ref <117)

## 2013-08-22 LAB — MAGNESIUM: Magnesium: 2.2 mg/dL (ref 1.5–2.5)

## 2013-08-22 LAB — APTT: APTT: 59 s — AB (ref 24–37)

## 2013-08-22 LAB — TSH: TSH: 3.61 u[IU]/mL (ref 0.350–4.500)

## 2013-08-22 LAB — PHOSPHORUS: Phosphorus: 3.6 mg/dL (ref 2.3–4.6)

## 2013-08-22 MED ORDER — MORPHINE SULFATE 2 MG/ML IJ SOLN
2.0000 mg | INTRAMUSCULAR | Status: DC | PRN
  Administered 2013-08-22 – 2013-08-26 (×10): 2 mg via INTRAVENOUS
  Filled 2013-08-22 (×10): qty 1

## 2013-08-22 MED ORDER — MOMETASONE FURO-FORMOTEROL FUM 100-5 MCG/ACT IN AERO
2.0000 | INHALATION_SPRAY | Freq: Two times a day (BID) | RESPIRATORY_TRACT | Status: DC
  Administered 2013-08-22 – 2013-09-01 (×15): 2 via RESPIRATORY_TRACT
  Filled 2013-08-22 (×2): qty 8.8

## 2013-08-22 MED ORDER — INSULIN ASPART 100 UNIT/ML ~~LOC~~ SOLN
0.0000 [IU] | Freq: Three times a day (TID) | SUBCUTANEOUS | Status: DC
  Administered 2013-08-23 – 2013-08-26 (×4): 2 [IU] via SUBCUTANEOUS

## 2013-08-22 MED ORDER — ACETAMINOPHEN 325 MG PO TABS: 650.0000 mg | ORAL_TABLET | Freq: Four times a day (QID) | ORAL | Status: DC | PRN

## 2013-08-22 MED ORDER — ONDANSETRON HCL 4 MG/2ML IJ SOLN
4.0000 mg | Freq: Four times a day (QID) | INTRAMUSCULAR | Status: DC | PRN
  Administered 2013-08-28 – 2013-08-29 (×3): 4 mg via INTRAVENOUS
  Filled 2013-08-22 (×3): qty 2

## 2013-08-22 MED ORDER — INSULIN ASPART 100 UNIT/ML ~~LOC~~ SOLN: 0.0000 [IU] | Freq: Every day | SUBCUTANEOUS | Status: DC

## 2013-08-22 MED ORDER — MORPHINE SULFATE 4 MG/ML IJ SOLN
4.0000 mg | Freq: Once | INTRAMUSCULAR | Status: AC
  Administered 2013-08-22: 4 mg via INTRAVENOUS
  Filled 2013-08-22: qty 1

## 2013-08-22 MED ORDER — ACETAMINOPHEN 650 MG RE SUPP: 650.0000 mg | Freq: Four times a day (QID) | RECTAL | Status: DC | PRN

## 2013-08-22 MED ORDER — METHIMAZOLE 5 MG PO TABS
5.0000 mg | ORAL_TABLET | Freq: Every day | ORAL | Status: DC
  Administered 2013-08-22 – 2013-08-23 (×2): 5 mg via ORAL
  Filled 2013-08-22 (×7): qty 1

## 2013-08-22 MED ORDER — LORAZEPAM 2 MG/ML IJ SOLN
0.2500 mg | Freq: Two times a day (BID) | INTRAMUSCULAR | Status: DC | PRN
  Administered 2013-08-22 – 2013-08-27 (×5): 0.25 mg via INTRAVENOUS
  Filled 2013-08-22 (×6): qty 1

## 2013-08-22 MED ORDER — POLYETHYLENE GLYCOL 3350 17 G PO PACK
17.0000 g | PACK | Freq: Two times a day (BID) | ORAL | Status: DC
  Administered 2013-08-22 – 2013-08-30 (×8): 17 g via ORAL
  Filled 2013-08-22 (×17): qty 1

## 2013-08-22 MED ORDER — DILTIAZEM HCL 60 MG PO TABS
120.0000 mg | ORAL_TABLET | Freq: Two times a day (BID) | ORAL | Status: DC
  Administered 2013-08-22 – 2013-08-24 (×6): 120 mg via ORAL
  Filled 2013-08-22 (×8): qty 2

## 2013-08-22 MED ORDER — ONDANSETRON HCL 4 MG PO TABS: 4.0000 mg | ORAL_TABLET | Freq: Four times a day (QID) | ORAL | Status: DC | PRN

## 2013-08-22 MED ORDER — SODIUM CHLORIDE 0.9 % IV SOLN
INTRAVENOUS | Status: DC
  Administered 2013-08-22 – 2013-08-29 (×7): via INTRAVENOUS
  Administered 2013-08-31: 30 mL/h via INTRAVENOUS

## 2013-08-22 MED ORDER — SODIUM CHLORIDE 0.9 % IV BOLUS (SEPSIS)
500.0000 mL | Freq: Once | INTRAVENOUS | Status: AC
  Administered 2013-08-22: 500 mL via INTRAVENOUS

## 2013-08-22 MED ORDER — WARFARIN SODIUM 2 MG PO TABS: 2.0000 mg | ORAL_TABLET | Freq: Every day | ORAL | Status: DC

## 2013-08-22 MED ORDER — DOCUSATE SODIUM 100 MG PO CAPS
100.0000 mg | ORAL_CAPSULE | Freq: Two times a day (BID) | ORAL | Status: DC
  Administered 2013-08-22 – 2013-08-24 (×3): 100 mg via ORAL
  Filled 2013-08-22 (×7): qty 1

## 2013-08-22 MED ORDER — LIDOCAINE 5 % EX PTCH
1.0000 | MEDICATED_PATCH | CUTANEOUS | Status: DC
  Administered 2013-08-23 – 2013-08-31 (×9): 1 via TRANSDERMAL
  Filled 2013-08-22 (×10): qty 1

## 2013-08-22 MED ORDER — METOCLOPRAMIDE HCL 5 MG/ML IJ SOLN
2.5000 mg | Freq: Two times a day (BID) | INTRAMUSCULAR | Status: DC
  Administered 2013-08-22: 22:00:00 via INTRAVENOUS
  Administered 2013-08-22 – 2013-09-01 (×19): 2.5 mg via INTRAVENOUS
  Filled 2013-08-22: qty 0.5
  Filled 2013-08-22: qty 2
  Filled 2013-08-22 (×2): qty 0.5
  Filled 2013-08-22: qty 2
  Filled 2013-08-22: qty 0.5
  Filled 2013-08-22: qty 2
  Filled 2013-08-22 (×17): qty 0.5

## 2013-08-22 MED ORDER — DOXYCYCLINE HYCLATE 100 MG IV SOLR
100.0000 mg | Freq: Two times a day (BID) | INTRAVENOUS | Status: DC
  Administered 2013-08-22 – 2013-08-26 (×8): 100 mg via INTRAVENOUS
  Filled 2013-08-22 (×9): qty 100

## 2013-08-22 MED ORDER — IOHEXOL 300 MG/ML  SOLN
50.0000 mL | Freq: Once | INTRAMUSCULAR | Status: AC | PRN
  Administered 2013-08-22: 50 mL via ORAL

## 2013-08-22 MED ORDER — WARFARIN - PHARMACIST DOSING INPATIENT: Freq: Every day | Status: DC

## 2013-08-22 MED ORDER — PIPERACILLIN-TAZOBACTAM 3.375 G IVPB
3.3750 g | Freq: Three times a day (TID) | INTRAVENOUS | Status: DC
  Administered 2013-08-22: 3.375 g via INTRAVENOUS
  Filled 2013-08-22 (×3): qty 50

## 2013-08-22 MED ORDER — SODIUM CHLORIDE 0.9 % IV SOLN: INTRAVENOUS | Status: AC

## 2013-08-22 NOTE — ED Notes (Signed)
Bed: WA06 Expected date: 08/22/13 Expected time: 5:06 AM Means of arrival: Ambulance Comments: 80 yo F  UTI

## 2013-08-22 NOTE — Progress Notes (Signed)
[facility]  Patient Status: Active (receiving services up to time of hospitalization)  [facility] is providing the following services: [facility] Co[facility]rd [facility]. The only services that has had a visit with the patient at this time is SN.   If patient discharges after hours, please call [phone].   Lanae CrumblyKristen Hayworth 08/22/2013, 11:21 AM

## 2013-08-22 NOTE — H&P (Addendum)
[facility] History and Physical  LAMA NARAYANAN ZOX:[id number] DOB: [birth year] DOA: 08/22/2013  Referring physician: ER physician PCP: Mickie Hillier, MD  Chief Complaint: altered mental status  HPI:  80 year old female with past medical history of hypertension, atrial fibrillation, DVT and PE on coumadin, diabetes, recent hospitalization for UTI due to serratia (was supposed to be on cipro for 10 days after discharge 4/2) who presented to [facility] ED 08/22/2013 with worsening mental status changed in past 24 hours prior to this admission. Pt is not a good historian as she is lethargic at this time. She was given morphine for pain in ED. Patient was apparently (per her daughters) hallucinating and thinking someone has robbed her house and then she did not know where she was and could no t recognize her daughters. She did not have fevers but her daughters felt she was warm. She also complained of severe back pain. No fall and no loss of consciousness. No vomiting. No cough. In ED, vitals are stable. Blood work revealed WBC count 10.9, hemoglobin 10.2, creatinine 1.94. Abdominal x ray showed new distention of small bowel loops possible small bowel obstruction. This was also seen on CT abdomen.  Assessment and Plan:  Principal Problem:   Acute encephalopathy - possible progressive dementia but also UTI and cellulitis of LE - will start zosyn for UTI (for 6 more days which would complete 10 day abx tx from previous admission), will start doxycycline for LE cellulitis - PT eval once she is able to participate Active Problems:   Diabetes mellitus type 2, uncontrolled - hold off on PO meds until SBO resolves; use SSI   Lower extremity edema - obtain LE doppler - she is on coumadin fr DVT/PE but per daughter swelling is worse and would like to re-evaluate - hold off on lasix due to slightly low BP on admission. She is NPO so she is IV fluids for next 24 hours.   Atrial fibrillation - rate  controlled with Cardizem - coumadin per pharmacy   History of pulmonary embolism - on coumadin   HTN (hypertension) - hold off on other BP meds except for Cardizem until BP improves   SBO (small bowel obstruction) - conservative management with IV fluids, antiemetics and analgesia - Bowel regimen: colace, miralax, tap water enema   Leukocytosis, unspecified - secondary to UTI. LE cellulitis   Anemia of chronic disease - secondary to history of CKD - monitor CBC    Chronic kidney disease (CKD), stage IV (severe) - creatinine 2.11on 08/17/2013 and this admission 1.9; already better with IV fluids - hold lasix for next 24 hours while IV fluids running  Radiological Exams on Admission: Ct Abdomen Pelvis Wo Contrast 08/22/2013   IMPRESSION: Slight prominence of abdominal and pelvic small bowel loops with decompressed distal small bowel. Findings may reflect early or low grade partial small bowel obstruction.  Decreasing rectum sigmoid stool burden.  Colonic diverticulosis.  Suprapubic catheter remains in the bladder which is decompressed.   Electronically Signed   By: Charlett Nose M.D.   On: 08/22/2013 09:05   Dg Abd Acute W/chest 08/22/2013    IMPRESSION: 1. New distention of small bowel loops to 4.2 cm in maximal diameter, raising concern for some degree of small bowel obstruction. Alternately, this could reflect mild ileus. No free intra-abdominal air seen. 2. Mild vascular congestion noted; lungs remain grossly clear.   Electronically Signed   By: Roanna Raider M.D.   On: 08/22/2013 06:37  Code Status: Full Family Communication: Daughters at the bedside Disposition Plan: Admit for further evaluation  Manson Passey, MD  [facility] Pager [phone]  Review of Systems:  Pt unable to provide due to lethargy   Past Medical History  Diagnosis Date  . Hypertension   . GERD (gastroesophageal reflux disease)   . Diabetes mellitus   . History of atrial fibrillation EPISODE YRS AGO  .  Urinary retention     s/p suprapubic catheter November 2013  . Coronary artery disease cardiologist- dr Maylon Cos--  lov 09-12-2010 in epic    non-obstructinve min. cad  . Foley catheter in place   . Edema of lower extremity BILATERAL LEG  . Hyperlipidemia   . History of pulmonary embolus (PE) 2010--  TAKES COUMADIN  . History of DVT of lower extremity   . Anticoagulated on Coumadin   . Generalized weakness AGE RELATED--  USES WALKER  . Vertigo   . OA (osteoarthritis)   . Thin skin FRAGILE  . Hypothyroidism NO MEDS  . Impaired hearing BILATERAL AIDS  . Chronic diarrhea INTERMITTANT   Past Surgical History  Procedure Laterality Date  . Knee surgery    . Tubal ligation    . Cardiac catheterization  09-27-2004  DR TENNENT    NORMAL LVF/ MILD IRREGULARITIES IN MID LAD W/ NO SIGNIFICANT OBSTRUCTIVE DISEASE  . Abdominal hysterectomy  1994  (APPROX)    W/ BILATERAL SALPINGO-OOPHORECTOMY  . Cholecystectomy  2000  . Knee arthroscopy  1998  (APPROX)  . Bladder suspension  2002  (APPROX)    W/ ANTERIOR AND POSTERIOR REPAIR  . Cataract extraction w/ intraocular lens  implant, bilateral    . Transthoracic echocardiogram  02-23-2009  (DX W/ PE & PAF)    EF 65-70%/ MILD MR/ MODERATE TR/ MILDLY DILATED RA  &  LA/  RV SYSTOLIC FUNCTION MODERATLY REDUCED   . Insertion of suprapubic catheter  04/02/2012    Procedure: INSERTION OF SUPRAPUBIC CATHETER;  Surgeon: Garnett Farm, MD;  Location: [facility];  Service: Urology;  Laterality: N/A;  SUPRAPUBIC TUBE PLACEMENT  . Cystoscopy  04/02/2012    Procedure: CYSTOSCOPY;  Surgeon: Garnett Farm, MD;  Location: [facility];  Service: Urology;;   Social History:  reports that she has never smoked. Her smokeless tobacco use includes Snuff. She reports that she does not drink alcohol or use illicit drugs.  Allergies  Allergen Reactions  . Humibid La [Guaifenesin] Shortness Of Breath  . Latex Hives  . Detrol [Tolterodine  Tartrate] Hives  . Keflex [Cephalexin]     hives  . Vioxx [Rofecoxib] Hives    Family History  Problem Relation Age of Onset  . Heart disease Mother   . Hypertension Mother      Prior to Admission medications   Medication Sig Start Date End Date Taking? Authorizing Provider  acetaminophen (TYLENOL) 500 MG tablet Take 500-1,000 mg by mouth every 6 (six) hours as needed (for pain).   Yes Historical Provider, MD  ALPRAZolam (XANAX) 0.25 MG tablet Take 0.125-0.25 mg by mouth 2 (two) times daily as needed for sleep or anxiety (0.5-1 tablet).    Yes Historical Provider, MD  cetirizine (ZYRTEC) 10 MG chewable tablet Chew 10 mg by mouth daily.   Yes Historical Provider, MD  ciprofloxacin (CIPRO) 500 MG tablet Take 500 mg by mouth daily. 10 day therapy course patient began on 08/19/2013 08/19/13  Yes Clydia Llano, MD  diltiazem (CARDIZEM) 120 MG tablet Take 120  mg by mouth 2 (two) times daily.   Yes Historical Provider, MD  ferrous sulfate 325 (65 FE) MG tablet Take 325 mg by mouth at bedtime.    Yes Historical Provider, MD  Fluticasone-Salmeterol (ADVAIR) 250-50 MCG/DOSE AEPB Inhale 1 puff into the lungs 2 (two) times daily as needed (shortness of breath).    Yes Historical Provider, MD  furosemide (LASIX) 40 MG tablet Take 60 mg by mouth daily. Take 1.5 tablets 09/27/12  Yes Tonny Bollman, MD  insulin glargine (LANTUS) 100 UNIT/ML injection Inject 0.45 mLs (45 Units total) into the skin at bedtime. 03/03/13  Yes Marinda Elk, MD  lidocaine (LIDODERM) 5 % Place 1 patch onto the skin daily. Applied to back. Remove & Discard patch within 12 hours or as directed by MD   Yes Historical Provider, MD  losartan (COZAAR) 100 MG tablet Take 100 mg by mouth daily.   Yes Historical Provider, MD  methimazole (TAPAZOLE) 10 MG tablet Take 5 mg by mouth daily.   Yes Historical Provider, MD  metoCLOPramide (REGLAN) 5 MG tablet Take 2.5 mg by mouth 2 (two) times daily.    Yes Historical Provider, MD   mirabegron ER (MYRBETRIQ) 25 MG TB24 tablet Take 25 mg by mouth daily.   Yes Historical Provider, MD  mirtazapine (REMERON) 15 MG tablet Take 15 mg by mouth at bedtime.   Yes Historical Provider, MD  Polyethyl Glycol-Propyl Glycol (SYSTANE OP) Apply 1 drop to eye daily as needed (dry eye).   Yes Historical Provider, MD  potassium chloride SA (K-DUR,KLOR-CON) 20 MEQ tablet Take 10 mEq by mouth 2 (two) times daily.   Yes Historical Provider, MD  pravastatin (PRAVACHOL) 20 MG tablet Take 20 mg by mouth daily.    Yes Historical Provider, MD  sitaGLIPtin (JANUVIA) 50 MG tablet Take 50 mg by mouth daily.   Yes Historical Provider, MD  vitamin B-12 (CYANOCOBALAMIN) 1000 MCG tablet Take 1,000 mcg by mouth daily.   Yes Historical Provider, MD  warfarin (COUMADIN) 2 MG tablet Take 2-3 mg by mouth daily. 2 mg on sun, tue, thur and 3 mg all other days   Yes Historical Provider, MD   Physical Exam: Filed Vitals:   08/22/13 0931 08/22/13 1000 08/22/13 1152 08/22/13 1349  BP: 131/67 150/59  151/80  Pulse: 73   81  Temp:    97.6 F (36.4 C)  TempSrc:    Oral  Resp: 13 16  16   SpO2: 94%  94% 95%    Physical Exam  Constitutional: Appears ill, pale  HENT: Normocephalic. Dry mucus membranes.  Eyes: Conjunctivae pale, PERRLA  Neck: Normal ROM. Neck supple. No JVD. No tracheal deviation.  CVS: RRR, S1/S2 appreciated  Pulmonary: Effort and breath sounds normal, no stridor, rhonchi, wheezes, rales.  Abdominal: Soft. BS +, firm and distended Musculoskeletal: Bilateral LE erythema and swelling, pulses palpable  Lymphadenopathy: No lymphadenopathy noted, cervical, inguinal. Neuro: Sleeping, no acute neurologic deficits Skin: Skin is warm and dry. LE cellulitis   Labs on Admission:  Basic Metabolic Panel:  Recent Labs Lab 08/16/13 0640 08/17/13 0550 08/18/13 0820 08/22/13 0630 08/22/13 1125  NA 138 136* 137 136* 135*  K 4.3 4.1 4.0 4.2 3.9  CL 96 97 100 94* 95*  CO2 28 25 28 28 26   GLUCOSE 103*  209* 137* 164* 130*  BUN 61* 49* 31* 28* 28*  CREATININE 2.67* 2.18* 1.68* 1.94* 1.86*  CALCIUM 8.8 8.3* 8.1* 8.7 8.2*  MG 3.6* 3.3* 2.8*  --  2.2  PHOS  --   --   --   --  3.6   Liver Function Tests:  Recent Labs Lab 08/16/13 0640 08/17/13 0550 08/22/13 1125  AST 13 15 22   ALT 7 7 13   ALKPHOS 116 110 96  BILITOT 0.3 0.3 0.3  PROT 6.9 6.4 6.9  ALBUMIN 2.9* 2.7* 2.9*   No results found for this basename: LIPASE, AMYLASE,  in the last 168 hours No results found for this basename: AMMONIA,  in the last 168 hours CBC:  Recent Labs Lab 08/16/13 0640 08/17/13 0550 08/18/13 0820 08/22/13 0630 08/22/13 1125  WBC 11.0* 13.7* 11.0* 10.9* 11.7*  NEUTROABS 7.1 11.3* 8.3* 7.5 7.1  HGB 10.1* 9.9* 8.6* 10.2* 9.3*  HCT 31.3* 29.6* 26.9* 31.6* 28.5*  MCV 82.8 82.2 83.8 81.7 81.7  PLT 322 301 267 385 369   Cardiac Enzymes:  Recent Labs Lab 08/15/13 1610  TROPONINI <0.30   BNP: No components found with this basename: POCBNP,  CBG:  Recent Labs Lab 08/18/13 0003 08/18/13 0327 08/18/13 0746 08/18/13 1212 08/22/13 1153  GLUCAP 70 105* 149* 202* 119*    If 7PM-7AM, please contact night-coverage [email or web address] Password [email or web address] 08/22/2013, 4:06 PM

## 2013-08-22 NOTE — Progress Notes (Signed)
ANTIBIOTIC CONSULT NOTE - INITIAL  Pharmacy Consult for Zosyn Indication: UTI  Allergies  Allergen Reactions  . Humibid La [Guaifenesin] Shortness Of Breath  . Latex Hives  . Detrol [Tolterodine Tartrate] Hives  . Keflex [Cephalexin]     hives  . Vioxx [Rofecoxib] Hives    Patient Measurements:     Vital Signs: Temp: 97.6 F (36.4 C) (04/06 1349) Temp src: Oral (04/06 1349) BP: 151/80 mmHg (04/06 1349) Pulse Rate: 81 (04/06 1349) Intake/Output from previous day:   Intake/Output from this shift: Total I/O In: 0  Out: 250 [Urine:250]  Labs:  Recent Labs  08/22/13 0630 08/22/13 1125  WBC 10.9* 11.7*  HGB 10.2* 9.3*  PLT 385 369  CREATININE 1.94* 1.86*   The CrCl is unknown because both a height and weight (above a minimum accepted value) are required for this calculation. No results found for this basename: Rolm Gala, VANCORANDOM, GENTTROUGH, GENTPEAK, GENTRANDOM, TOBRATROUGH, TOBRAPEAK, TOBRARND, AMIKACINPEAK, AMIKACINTROU, AMIKACIN,  in the last 72 hours   Microbiology: Recent Results (from the past 720 hour(s))  URINE CULTURE     Status: None   Collection Time    08/15/13 12:32 PM      Result Value Ref Range Status   Specimen Description URINE, SUPRAPUBIC   Final   Special Requests Normal   Final   Culture  Setup Time     Final   Value: 08/15/2013 17:46     Performed at [facility]   Colony Count     Final   Value: >=100,000 COLONIES/ML     Performed at [facility]   Culture     Final   Value: SERRATIA MARCESCENS     Performed at [facility]   Report Status 08/17/2013 FINAL   Final   Organism ID, Bacteria SERRATIA MARCESCENS   Final  URINE CULTURE     Status: None   Collection Time    08/15/13 12:32 PM      Result Value Ref Range Status   Specimen Description URINE, CATHETERIZED   Final   Special Requests NONE   Final   Culture  Setup Time     Final   Value: 08/15/2013 21:39     Performed at [facility]   Colony Count     Final   Value: >=100,000 COLONIES/ML     Performed at [facility]   Culture     Final   Value: SERRATIA MARCESCENS     Performed at [facility]   Report Status 08/18/2013 FINAL   Final   Organism ID, Bacteria SERRATIA MARCESCENS   Final    Medical History: Past Medical History  Diagnosis Date  . Hypertension   . GERD (gastroesophageal reflux disease)   . Diabetes mellitus   . History of atrial fibrillation EPISODE YRS AGO  . Urinary retention     s/p suprapubic catheter November 2013  . Coronary artery disease cardiologist- dr Maylon Cos--  lov 09-12-2010 in epic    non-obstructinve min. cad  . Foley catheter in place   . Edema of lower extremity BILATERAL LEG  . Hyperlipidemia   . History of pulmonary embolus (PE) 2010--  TAKES COUMADIN  . History of DVT of lower extremity   . Anticoagulated on Coumadin   . Generalized weakness AGE RELATED--  USES WALKER  . Vertigo   . OA (osteoarthritis)   . Thin skin FRAGILE  . Hypothyroidism NO MEDS  . Impaired hearing BILATERAL AIDS  .  Chronic diarrhea INTERMITTANT    Medications:  Prescriptions prior to admission  Medication Sig Dispense Refill  . acetaminophen (TYLENOL) 500 MG tablet Take 500-1,000 mg by mouth every 6 (six) hours as needed (for pain).      Marland Kitchen ALPRAZolam (XANAX) 0.25 MG tablet Take 0.125-0.25 mg by mouth 2 (two) times daily as needed for sleep or anxiety (0.5-1 tablet).       . cetirizine (ZYRTEC) 10 MG chewable tablet Chew 10 mg by mouth daily.      . ciprofloxacin (CIPRO) 500 MG tablet Take 500 mg by mouth daily. 10 day therapy course patient began on 08/19/2013      . diltiazem (CARDIZEM) 120 MG tablet Take 120 mg by mouth 2 (two) times daily.      . ferrous sulfate 325 (65 FE) MG tablet Take 325 mg by mouth at bedtime.       . Fluticasone-Salmeterol (ADVAIR) 250-50 MCG/DOSE AEPB Inhale 1 puff into the lungs 2 (two) times daily as needed (shortness of breath).       .  furosemide (LASIX) 40 MG tablet Take 60 mg by mouth daily. Take 1.5 tablets  1 tablet    . insulin glargine (LANTUS) 100 UNIT/ML injection Inject 0.45 mLs (45 Units total) into the skin at bedtime.  10 mL  12  . lidocaine (LIDODERM) 5 % Place 1 patch onto the skin daily. Applied to back. Remove & Discard patch within 12 hours or as directed by MD      . losartan (COZAAR) 100 MG tablet Take 100 mg by mouth daily.      . methimazole (TAPAZOLE) 10 MG tablet Take 5 mg by mouth daily.      . metoCLOPramide (REGLAN) 5 MG tablet Take 2.5 mg by mouth 2 (two) times daily.       . mirabegron ER (MYRBETRIQ) 25 MG TB24 tablet Take 25 mg by mouth daily.      . mirtazapine (REMERON) 15 MG tablet Take 15 mg by mouth at bedtime.      [name] (SYSTANE OP) Apply 1 drop to eye daily as needed (dry eye).      . potassium chloride SA (K-DUR,KLOR-CON) 20 MEQ tablet Take 10 mEq by mouth 2 (two) times daily.      . pravastatin (PRAVACHOL) 20 MG tablet Take 20 mg by mouth daily.       . sitaGLIPtin (JANUVIA) 50 MG tablet Take 50 mg by mouth daily.      . vitamin B-12 (CYANOCOBALAMIN) 1000 MCG tablet Take 1,000 mcg by mouth daily.      Marland Kitchen warfarin (COUMADIN) 2 MG tablet Take 2-3 mg by mouth daily. 2 mg on sun, tue, thur and 3 mg all other days       Scheduled:  . sodium chloride   Intravenous STAT  . diltiazem  120 mg Oral BID  . doxycycline (VIBRAMYCIN) IV  100 mg Intravenous Q12H  . insulin aspart  0-15 Units Subcutaneous TID WC  . insulin aspart  0-5 Units Subcutaneous QHS  . [START ON 08/23/2013] lidocaine  1 patch Transdermal Q24H  . methimazole  5 mg Oral Daily  . metoCLOPramide (REGLAN) injection  2.5 mg Intravenous Q12H  . mometasone-formoterol  2 puff Inhalation BID  . Warfarin - Pharmacist Dosing Inpatient   Does not apply q1800   Infusions:  . sodium chloride 75 mL/hr at 08/22/13 1148   PRN: acetaminophen, acetaminophen, LORazepam, morphine injection, ondansetron (ZOFRAN) IV,  ondansetron Assessment:  80 yo F recently discharged on 4/2 after being hospitalized for serratia UTI.  She was instructed to start Ciprofloxacin for 10 days after discharge, her last dose prior to admission reported as 4/5.   She presents today, 08/22/2013 with worsening mental status changed in past 24 hours.   MD started Doxycycline for RLE cellulitis   Pharmacy consulted to start Zosyn for broad spectrum UTI coverage   CKD Stage IV, Scr today 1.86  Goal of Therapy:  Zosyn per renal function   Plan:  1.) Zosyn 3.375 grams IV Q8h, infuse over 4 hours.  Will need to monitor renal function closely and adjust if CrCl < 20 ml/min.  BMET in AM 2.) Doxycycline for RLE cellulitis per MD 3.) f/u cultures if obtained   Justan Gaede, Loma MessingMary Patricia PharmD Pager #: [phone] 4:16 PM 08/22/2013  ]

## 2013-08-22 NOTE — ED Provider Notes (Signed)
CSN: [id number]     Arrival date & time 08/22/13  0536 History   First MD Initiated Contact with Patient 08/22/13 0703     Chief Complaint  Patient presents with  . Back Pain     (Consider location/radiation/quality/duration/timing/severity/associated sxs/prior Treatment) The history is provided by the patient.  SHAIRA SOVA is a 80 y.o. female hx of HTN, GERD, DM, suprapubic catheter here with back pain and abdominal pain. She was admitted a week ago for constipation and ileus. Also had UTI and has been on Cipro. The last several days she's been having worsening abdominal distention as well as back pain. She also noticed that both of her legs are swollen worse on the right side. She's been having loose bowel movements and feeling nauseous but not vomiting. Denies any fevers or chills.    Past Medical History  Diagnosis Date  . Hypertension   . GERD (gastroesophageal reflux disease)   . Diabetes mellitus   . History of atrial fibrillation EPISODE YRS AGO  . Urinary retention     s/p suprapubic catheter November 2013  . Coronary artery disease cardiologist- dr Maylon Cos--  lov 09-12-2010 in epic    non-obstructinve min. cad  . Foley catheter in place   . Edema of lower extremity BILATERAL LEG  . Hyperlipidemia   . History of pulmonary embolus (PE) 2010--  TAKES COUMADIN  . History of DVT of lower extremity   . Anticoagulated on Coumadin   . Generalized weakness AGE RELATED--  USES WALKER  . Vertigo   . OA (osteoarthritis)   . Thin skin FRAGILE  . Hypothyroidism NO MEDS  . Impaired hearing BILATERAL AIDS  . Chronic diarrhea INTERMITTANT   Past Surgical History  Procedure Laterality Date  . Knee surgery    . Tubal ligation    . Abdominal hysterectomy    . Cardiac catheterization  09-27-2004  DR TENNENT    NORMAL LVF/ MILD IRREGULARITIES IN MID LAD W/ NO SIGNIFICANT OBSTRUCTIVE DISEASE  . Abdominal hysterectomy  1994  (APPROX)    W/ BILATERAL SALPINGO-OOPHORECTOMY  .  Cholecystectomy  2000  . Knee arthroscopy  1998  (APPROX)  . Bladder suspension  2002  (APPROX)    W/ ANTERIOR AND POSTERIOR REPAIR  . Cataract extraction w/ intraocular lens  implant, bilateral    . Transthoracic echocardiogram  02-23-2009  (DX W/ PE & PAF)    EF 65-70%/ MILD MR/ MODERATE TR/ MILDLY DILATED RA  &  LA/  RV SYSTOLIC FUNCTION MODERATLY REDUCED   . Insertion of suprapubic catheter  04/02/2012    Procedure: INSERTION OF SUPRAPUBIC CATHETER;  Surgeon: Garnett Farm, MD;  Location: [facility];  Service: Urology;  Laterality: N/A;  SUPRAPUBIC TUBE PLACEMENT  . Cystoscopy  04/02/2012    Procedure: CYSTOSCOPY;  Surgeon: Garnett Farm, MD;  Location: [facility];  Service: Urology;;   Family History  Problem Relation Age of Onset  . Heart disease Mother   . Hypertension Mother    History  Substance Use Topics  . Smoking status: Never Smoker   . Smokeless tobacco: Current User    Types: Snuff     Comment: DIPPED TOBACCO SINCE AGE 60  . Alcohol Use: No   OB History   Grav Para Term Preterm Abortions TAB SAB Ect Mult Living                 Review of Systems  Gastrointestinal: Positive for abdominal pain.  Musculoskeletal:  Positive for back pain.  All other systems reviewed and are negative.      Allergies  Humibid la; Latex; Detrol; Keflex; and Vioxx  Home Medications   Current Outpatient Rx  Name  Route  Sig  Dispense  Refill  . acetaminophen (TYLENOL) 500 MG tablet   Oral   Take 500-1,000 mg by mouth every 6 (six) hours as needed (for pain).         Marland Kitchen ALPRAZolam (XANAX) 0.25 MG tablet   Oral   Take 0.125-0.25 mg by mouth 2 (two) times daily as needed for sleep or anxiety (0.5-1 tablet).          . cetirizine (ZYRTEC) 10 MG chewable tablet   Oral   Chew 10 mg by mouth daily.         . ciprofloxacin (CIPRO) 500 MG tablet   Oral   Take 500 mg by mouth daily. 10 day therapy course patient began on 08/19/2013          . diltiazem (CARDIZEM) 120 MG tablet   Oral   Take 120 mg by mouth 2 (two) times daily.         . ferrous sulfate 325 (65 FE) MG tablet   Oral   Take 325 mg by mouth at bedtime.          . Fluticasone-Salmeterol (ADVAIR) 250-50 MCG/DOSE AEPB   Inhalation   Inhale 1 puff into the lungs 2 (two) times daily as needed (shortness of breath).          . furosemide (LASIX) 40 MG tablet   Oral   Take 60 mg by mouth daily. Take 1.5 tablets   1 tablet      . insulin glargine (LANTUS) 100 UNIT/ML injection   Subcutaneous   Inject 0.45 mLs (45 Units total) into the skin at bedtime.   10 mL   12   . lidocaine (LIDODERM) 5 %   Transdermal   Place 1 patch onto the skin daily. Applied to back. Remove & Discard patch within 12 hours or as directed by MD         . losartan (COZAAR) 100 MG tablet   Oral   Take 100 mg by mouth daily.         . methimazole (TAPAZOLE) 10 MG tablet   Oral   Take 5 mg by mouth daily.         . metoCLOPramide (REGLAN) 5 MG tablet   Oral   Take 2.5 mg by mouth 2 (two) times daily.          . mirabegron ER (MYRBETRIQ) 25 MG TB24 tablet   Oral   Take 25 mg by mouth daily.         . mirtazapine (REMERON) 15 MG tablet   Oral   Take 15 mg by mouth at bedtime.         [name] (SYSTANE OP)   Ophthalmic   Apply 1 drop to eye daily as needed (dry eye).         . potassium chloride SA (K-DUR,KLOR-CON) 20 MEQ tablet   Oral   Take 10 mEq by mouth 2 (two) times daily.         . pravastatin (PRAVACHOL) 20 MG tablet   Oral   Take 20 mg by mouth daily.          . sitaGLIPtin (JANUVIA) 50 MG tablet   Oral   Take 50 mg by mouth  daily.         . vitamin B-12 (CYANOCOBALAMIN) 1000 MCG tablet   Oral   Take 1,000 mcg by mouth daily.         Marland Kitchen. warfarin (COUMADIN) 2 MG tablet   Oral   Take 2-3 mg by mouth daily. 2 mg on sun, tue, thur and 3 mg all other days          BP 134/59  Pulse 76  Temp(Src) 98.3  F (36.8 C) (Oral)  Resp 16  SpO2 93% Physical Exam  Nursing note and vitals reviewed. Constitutional: She is oriented to person, place, and time.  Chronically ill, uncomfortable   HENT:  Head: Normocephalic.  Mouth/Throat: Oropharynx is clear and moist.  Eyes: Conjunctivae are normal. Pupils are equal, round, and reactive to light.  Neck: Normal range of motion. Neck supple.  Cardiovascular: Normal rate, regular rhythm and normal heart sounds.   Pulmonary/Chest: Effort normal and breath sounds normal. No respiratory distress. She has no wheezes. She has no rales.  Abdominal:  Distended, + mild diffuse tenderness   Musculoskeletal:  Diffuse R paralumbar tenderness. No obvious spinal tenderness. 2+ edema bilateral legs. Erythema bilateral lower leg.   Neurological: She is alert and oriented to person, place, and time. No cranial nerve deficit. Coordination normal.  Skin: Skin is warm and dry.  Psychiatric: She has a normal mood and affect. Her behavior is normal. Judgment and thought content normal.    ED Course  Procedures (including critical care time) Labs Review Labs Reviewed  CBC WITH DIFFERENTIAL - Abnormal; Notable for the following:    WBC 10.9 (*)    Hemoglobin 10.2 (*)    HCT 31.6 (*)    Monocytes Absolute 1.2 (*)    All other components within normal limits  BASIC METABOLIC PANEL - Abnormal; Notable for the following:    Sodium 136 (*)    Chloride 94 (*)    Glucose, Bld 164 (*)    BUN 28 (*)    Creatinine, Ser 1.94 (*)    GFR calc non Af Amer 22 (*)    GFR calc Af Amer 26 (*)    All other components within normal limits  URINALYSIS, ROUTINE W REFLEX MICROSCOPIC - Abnormal; Notable for the following:    APPearance CLOUDY (*)    Leukocytes, UA SMALL (*)    All other components within normal limits  PROTIME-INR - Abnormal; Notable for the following:    Prothrombin Time 32.6 (*)    INR 3.33 (*)    All other components within normal limits  URINE  MICROSCOPIC-ADD ON   Imaging Review Ct Abdomen Pelvis Wo Contrast  08/22/2013   CLINICAL DATA:  Abdominal pain, back pain. Will a small bowel obstruction.  EXAM: CT ABDOMEN AND PELVIS WITHOUT CONTRAST  TECHNIQUE: Multidetector CT imaging of the abdomen and pelvis was performed following the standard protocol without intravenous contrast.  COMPARISON:  DG ABD ACUTE W/CHEST dated 08/22/2013; CT ABD/PELV WO CM dated 08/15/2013  FINDINGS: Heart is normal size. Linear areas of atelectasis in the lung bases. No effusions.  Prior cholecystectomy. Liver, spleen, right adrenal gland have an unremarkable unenhanced appearance. Mild fatty replacement of the pancreas without focal abnormality. Small nodule in the left adrenal body measuring 13 mm and with a density of -12 Hounsfield units, compatible with adenoma. Mild cortical thinning within the kidneys without hydronephrosis or visible focal abnormality on this unenhanced study.  There are slightly dilated and fluid-filled small bowel loops in  the abdomen and pelvis. Scattered air-fluid levels. The distal small bowel loops are decompressed. Findings suspicious for early or low grade partial small bowel obstruction. The exact transition point or cause is not visualized. No free fluid or free air.  Suprapubic catheter is in place with the urinary bladder decompressed. Scattered diverticula throughout the colon, most pronounced in the sigmoid colon. No evidence of diverticulitis. No free fluid, free air or adenopathy. Appendix is visualized and is normal.  Aorta and iliac vessels are calcified, non aneurysmal.  No acute bony abnormality. Degenerative changes in the thoracolumbar spine. Mild compression fracture of the L1 superior endplate which is stable since prior CT.  Stool burden in the rectosigmoid colon has decreased since prior study.  IMPRESSION: Slight prominence of abdominal and pelvic small bowel loops with decompressed distal small bowel. Findings may reflect early  or low grade partial small bowel obstruction.  Decreasing rectum sigmoid stool burden.  Colonic diverticulosis.  Suprapubic catheter remains in the bladder which is decompressed.   Electronically Signed   By: Charlett Nose M.D.   On: 08/22/2013 09:05   Dg Abd Acute W/chest  08/22/2013   CLINICAL DATA:  Abdominal pain.  EXAM: ACUTE ABDOMEN SERIES (ABDOMEN 2 VIEW & CHEST 1 VIEW)  COMPARISON:  Chest radiograph performed 02/28/2013, and CT of the abdomen and pelvis performed 08/15/2013  FINDINGS: The lungs are well-aerated. Mild vascular congestion is noted. There is no evidence of focal opacification, pleural effusion or pneumothorax. The cardiomediastinal silhouette is within normal limits. There is chronic elevation of the lateral left hemidiaphragm.  There is new distention of small bowel loops to 4.2 cm in diameter, raising concern for some degree of small bowel obstruction. Residual air and contrast is noted within the colon. No free intra-abdominal air is identified on the provided decubitus view.  No acute osseous abnormalities are seen; the sacroiliac joints are unremarkable in appearance.  IMPRESSION: 1. New distention of small bowel loops to 4.2 cm in maximal diameter, raising concern for some degree of small bowel obstruction. Alternately, this could reflect mild ileus. No free intra-abdominal air seen. 2. Mild vascular congestion noted; lungs remain grossly clear.   Electronically Signed   By: Roanna Raider M.D.   On: 08/22/2013 06:37     EKG Interpretation None      MDM   Final diagnoses:  Atrial fibrillation  History of pulmonary embolism  Lower extremity edema  Obese    JILLAINE WAREN is a 80 y.o. female hx of HTN, DM, here with back pain, ab pain. Consider ileus again vs SBO. I think back pain likely radiating from abdomen. Bilateral legs swollen and was recently in the hospital so will do [place] to r/o DVT. Will likely need admission again.   9:18 AM CT showed early SBO. US showed no  DVT. Will admit to medicine. I called surgery to follow.    Richardean Canal, MD 08/22/13 [phone]

## 2013-08-22 NOTE — Progress Notes (Signed)
   CARE MANAGEMENT ED NOTE 08/22/2013  Patient:  Krystal [name],Krystal [name]   Account Number:  [id number]  Date Initiated:  08/22/2013  Documentation initiated by:  Edd ArbourGIBBS,KIMBERLY  Subjective/Objective Assessment:   80 yr old medicare covered female dx SBO re admitted after [facility] 08/22/13 after pt voiced interest in snf placement     Subjective/Objective Assessment Detail:   [facility] will follow for d/c needs  Rica RecordsLlinda Stainback [phone]     Action/Plan:   Cm noted CM consult Cm spoke with Baxter HireKristen of [facility] about SBP admission   Action/Plan Detail:   Anticipated DC Date:  08/25/2013     Status Recommendation to Physician:   Result of Recommendation:    Other ED Services  Consult Working Plan    DC Planning Services  Other  Outpatient Services - Pt will follow up   [facility]   Choice offered to / List presented to:       [facility]  [facility]  HH-6 SOCIAL WORKER      HH agency  [facility]    Status of service:  Completed, signed off  ED Comments:   ED Comments Detail:   08/22/13 1036 Order in EPIC for SW to f/u on possible snf placement

## 2013-08-22 NOTE — Consult Note (Signed)
Krystal [name] Mar 23, 1925  [id number].    Requesting MD: Dr. Shirlyn Goltz, MD Chief Complaint/Reason for Consult: Abdominal pain, distention x 4 dats  HPI:  80 year old white female with a 4 day history of constipation and general abdominal discomfort. It is diffuse throughout her abdomen and increased in the lower right quadrant. She had severe constipation last week and was admitted to [facility] from 3/30 to 4/2 and received many enemas.  She was digitalized which cause her to have multiple BM's prior to discharge.  She was discharged on 4/2, had one bowel movement the next day and since that time she has not had another bowel movement. Her appetite is greatly decreased and she has felt some nausea without vomiting.There have been no precipitating or alleviating factors related to her abdominal pain. Her daughter reported that Krystal [name] felt like she had a fever this morning, but her temperature upon admission today was within normal limits. She denies chest pain, chills, and dyspnea.  ROS: Daughter reports a week long history of multiple hallucinations of people in her home. This has not happened in the past and does not seem related to medication use. Complaints of sharp, intermittent lower left back pain, which is affected by her position in her hospital bad. Has been occuring for several weeks. Had a UTI infection during her 3/30 hospital admission. No current complaints of dysuria. Difficulty determining if it is ongoing due to long term incontinence problems and use of depends. Complains of difficulty walking and leg swelling. All systems reviewed and otherwise negative except for as above.  Family History  Problem Relation Age of Onset  . Heart disease Mother   . Hypertension Mother     Past Medical History  Diagnosis Date  . Hypertension   . GERD (gastroesophageal reflux disease)   . Diabetes mellitus   . History of atrial fibrillation EPISODE YRS AGO  . Urinary retention     s/p  suprapubic catheter November 2013  . Coronary artery disease cardiologist- dr Vidal Schwalbe--  lov 09-12-2010 in epic    non-obstructinve min. cad  . Foley catheter in place   . Edema of lower extremity BILATERAL LEG  . Hyperlipidemia   . History of pulmonary embolus (PE) 2010--  TAKES COUMADIN  . History of DVT of lower extremity   . Anticoagulated on Coumadin   . Generalized weakness AGE RELATED--  USES WALKER  . Vertigo   . OA (osteoarthritis)   . Thin skin FRAGILE  . Hypothyroidism NO MEDS  . Impaired hearing BILATERAL AIDS  . Chronic diarrhea INTERMITTANT    Past Surgical History  Procedure Laterality Date  . Knee surgery    . Tubal ligation    . Cardiac catheterization  09-27-2004  DR TENNENT    NORMAL LVF/ MILD IRREGULARITIES IN MID LAD W/ NO SIGNIFICANT OBSTRUCTIVE DISEASE  . Abdominal hysterectomy  1994  (APPROX)    W/ BILATERAL SALPINGO-OOPHORECTOMY  . Cholecystectomy  2000  . Knee arthroscopy  1998  (APPROX)  . Bladder suspension  2002  (APPROX)    W/ ANTERIOR AND POSTERIOR REPAIR  . Cataract extraction w/ intraocular lens  implant, bilateral    . Transthoracic echocardiogram  02-23-2009  (DX W/ PE & PAF)    EF 65-70%/ MILD MR/ MODERATE TR/ MILDLY DILATED RA  &  LA/  RV SYSTOLIC FUNCTION MODERATLY REDUCED   . Insertion of suprapubic catheter  04/02/2012    Procedure: INSERTION OF SUPRAPUBIC CATHETER;  Surgeon: Claybon Jabs,  MD;  Location: [facility];  Service: Urology;  Laterality: N/A;  SUPRAPUBIC TUBE PLACEMENT  . Cystoscopy  04/02/2012    Procedure: CYSTOSCOPY;  Surgeon: Claybon Jabs, MD;  Location: [facility];  Service: Urology;;    Social History:  reports that she has never smoked. Her smokeless tobacco use includes Snuff. She reports that she does not drink alcohol or use illicit drugs.  Allergies:  Allergies  Allergen Reactions  . Humibid La [Guaifenesin] Shortness Of Breath  . Latex Hives  . Detrol [Tolterodine Tartrate]  Hives  . Keflex [Cephalexin]     hives  . Vioxx [Rofecoxib] Hives    Medications Prior to Admission  Medication Sig Dispense Refill  . acetaminophen (TYLENOL) 500 MG tablet Take 500-1,000 mg by mouth every 6 (six) hours as needed (for pain).      Marland Kitchen ALPRAZolam (XANAX) 0.25 MG tablet Take 0.125-0.25 mg by mouth 2 (two) times daily as needed for sleep or anxiety (0.5-1 tablet).       . cetirizine (ZYRTEC) 10 MG chewable tablet Chew 10 mg by mouth daily.      . ciprofloxacin (CIPRO) 500 MG tablet Take 500 mg by mouth daily. 10 day therapy course patient began on 08/19/2013      . diltiazem (CARDIZEM) 120 MG tablet Take 120 mg by mouth 2 (two) times daily.      . ferrous sulfate 325 (65 FE) MG tablet Take 325 mg by mouth at bedtime.       . Fluticasone-Salmeterol (ADVAIR) 250-50 MCG/DOSE AEPB Inhale 1 puff into the lungs 2 (two) times daily as needed (shortness of breath).       . furosemide (LASIX) 40 MG tablet Take 60 mg by mouth daily. Take 1.5 tablets  1 tablet    . insulin glargine (LANTUS) 100 UNIT/ML injection Inject 0.45 mLs (45 Units total) into the skin at bedtime.  10 mL  12  . lidocaine (LIDODERM) 5 % Place 1 patch onto the skin daily. Applied to back. Remove & Discard patch within 12 hours or as directed by MD      . losartan (COZAAR) 100 MG tablet Take 100 mg by mouth daily.      . methimazole (TAPAZOLE) 10 MG tablet Take 5 mg by mouth daily.      . metoCLOPramide (REGLAN) 5 MG tablet Take 2.5 mg by mouth 2 (two) times daily.       . mirabegron ER (MYRBETRIQ) 25 MG TB24 tablet Take 25 mg by mouth daily.      . mirtazapine (REMERON) 15 MG tablet Take 15 mg by mouth at bedtime.      Krystal [name] Glycol-Propyl Glycol (SYSTANE OP) Apply 1 drop to eye daily as needed (dry eye).      . potassium chloride SA (K-DUR,KLOR-CON) 20 MEQ tablet Take 10 mEq by mouth 2 (two) times daily.      . pravastatin (PRAVACHOL) 20 MG tablet Take 20 mg by mouth daily.       . sitaGLIPtin (JANUVIA) 50 MG tablet  Take 50 mg by mouth daily.      . vitamin B-12 (CYANOCOBALAMIN) 1000 MCG tablet Take 1,000 mcg by mouth daily.      Marland Kitchen warfarin (COUMADIN) 2 MG tablet Take 2-3 mg by mouth daily. 2 mg on sun, tue, thur and 3 mg all other days        Blood pressure 151/80, pulse 81, temperature 97.6 F (36.4 C), temperature source Oral, resp.  rate 16, SpO2 95.00%. Physical Exam: General: Patient appears in mild discomfort, tired, and mild confusion.pleasant, WD/WN white female HEENT: head is normocephalic, atraumatic.  Sclera are noninjected.  PERRL.  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  Ears and nose without any masses or lesions.  Mouth is pink and moist Heart: regular, rate, and rhythm.  Normal s1,s2. No obvious murmurs, gallops, or rubs noted.  Palpable radial pulses b/l. Lungs: mild course sounds upon expiration, no wheezes, rhonchi, or rales noted.  Respiratory effort noted mild dyspnea at rest. Abd: soft, quite distended, diffuse tenderness with >RLQ, +BS, no masses, hernias, or organomegaly, suprapubic catheter in place. MS: all 4 extremities are symmetrical with no cyanosis, clubbing, or edema. Skin: Non-raised circumfrencial  pink hyperpigmentation from mid-tibia to ankles. 3+ Bilateral edema from calf to toes.  Psych: Alert with constant voice stimuli, very sleepy, follow commands, confused   Results for orders placed during the hospital encounter of 08/22/13 (from the past 48 hour(s))  CBC WITH DIFFERENTIAL     Status: Abnormal   Collection Time    08/22/13  6:30 AM      Result Value Ref Range   WBC 10.9 (*) 4.0 - 10.5 K/uL   RBC 3.87  3.87 - 5.11 MIL/uL   Hemoglobin 10.2 (*) 12.0 - 15.0 g/dL   HCT 31.6 (*) 36.0 - 46.0 %   MCV 81.7  78.0 - 100.0 fL   MCH 26.4  26.0 - 34.0 pg   MCHC 32.3  30.0 - 36.0 g/dL   RDW 14.7  11.5 - 15.5 %   Platelets 385  150 - 400 K/uL   Neutrophils Relative % 69  43 - 77 %   Neutro Abs 7.5  1.7 - 7.7 K/uL   Lymphocytes Relative 16  12 - 46 %    Lymphs Abs 1.8  0.7 - 4.0 K/uL   Monocytes Relative 11  3 - 12 %   Monocytes Absolute 1.2 (*) 0.1 - 1.0 K/uL   Eosinophils Relative 3  0 - 5 %   Eosinophils Absolute 0.4  0.0 - 0.7 K/uL   Basophils Relative 0  0 - 1 %   Basophils Absolute 0.0  0.0 - 0.1 K/uL  BASIC METABOLIC PANEL     Status: Abnormal   Collection Time    08/22/13  6:30 AM      Result Value Ref Range   Sodium 136 (*) 137 - 147 mEq/L   Potassium 4.2  3.7 - 5.3 mEq/L   Chloride 94 (*) 96 - 112 mEq/L   CO2 28  19 - 32 mEq/L   Glucose, Bld 164 (*) 70 - 99 mg/dL   BUN 28 (*) 6 - 23 mg/dL   Creatinine, Ser 1.94 (*) 0.50 - 1.10 mg/dL   Calcium 8.7  8.4 - 10.5 mg/dL   GFR calc non Af Amer 22 (*) >90 mL/min   GFR calc Af Amer 26 (*) >90 mL/min   Comment: (NOTE)     The eGFR has been calculated using the CKD EPI equation.     This calculation has not been validated in all clinical situations.     eGFR's persistently <90 mL/min signify possible Chronic Kidney     Disease.  HEMOGLOBIN A1C     Status: Abnormal   Collection Time    08/22/13  6:30 AM      Result Value Ref Range   Hemoglobin A1C 6.4 (*) <5.7 %   Comment: (NOTE)  According to the ADA Clinical Practice Recommendations for 2011, when     HbA1c is used as a screening test:      >=6.5%   Diagnostic of Diabetes Mellitus               (if abnormal result is confirmed)     5.7-6.4%   Increased risk of developing Diabetes Mellitus     References:Diagnosis and Classification of Diabetes Mellitus,Diabetes     XBMW,4132,44(WNUUV 1):S62-S69 and Standards of Medical Care in             Diabetes - 2011,Diabetes OZDG,6440,34 (Suppl 1):S11-S61.   Mean Plasma Glucose 137 (*) <117 mg/dL   Comment: Performed at [place], ROUTINE W REFLEX MICROSCOPIC     Status: Abnormal   Collection Time    08/22/13  6:33 AM      Result Value Ref Range   Color, Urine YELLOW  YELLOW    APPearance CLOUDY (*) CLEAR   Specific Gravity, Urine 1.018  1.005 - 1.030   pH 5.0  5.0 - 8.0   Glucose, UA NEGATIVE  NEGATIVE mg/dL   Hgb urine dipstick NEGATIVE  NEGATIVE   Bilirubin Urine NEGATIVE  NEGATIVE   Ketones, ur NEGATIVE  NEGATIVE mg/dL   Protein, ur NEGATIVE  NEGATIVE mg/dL   Urobilinogen, UA 0.2  0.0 - 1.0 mg/dL   Nitrite NEGATIVE  NEGATIVE   Leukocytes, UA SMALL (*) NEGATIVE  URINE MICROSCOPIC-ADD ON     Status: None   Collection Time    08/22/13  6:33 AM      Result Value Ref Range   Squamous Epithelial / LPF RARE  RARE   WBC, UA 7-10  <3 WBC/hpf   Bacteria, UA RARE  RARE  PROTIME-INR     Status: Abnormal   Collection Time    08/22/13  7:35 AM      Result Value Ref Range   Prothrombin Time 32.6 (*) 11.6 - 15.2 seconds   INR 3.33 (*) 0.00 - 1.49  COMPREHENSIVE METABOLIC PANEL     Status: Abnormal   Collection Time    08/22/13 11:25 AM      Result Value Ref Range   Sodium 135 (*) 137 - 147 mEq/L   Potassium 3.9  3.7 - 5.3 mEq/L   Chloride 95 (*) 96 - 112 mEq/L   CO2 26  19 - 32 mEq/L   Glucose, Bld 130 (*) 70 - 99 mg/dL   BUN 28 (*) 6 - 23 mg/dL   Creatinine, Ser 1.86 (*) 0.50 - 1.10 mg/dL   Calcium 8.2 (*) 8.4 - 10.5 mg/dL   Total Protein 6.9  6.0 - 8.3 g/dL   Albumin 2.9 (*) 3.5 - 5.2 g/dL   AST 22  0 - 37 U/L   ALT 13  0 - 35 U/L   Alkaline Phosphatase 96  39 - 117 U/L   Total Bilirubin 0.3  0.3 - 1.2 mg/dL   GFR calc non Af Amer 23 (*) >90 mL/min   GFR calc Af Amer 27 (*) >90 mL/min   Comment: (NOTE)     The eGFR has been calculated using the CKD EPI equation.     This calculation has not been validated in all clinical situations.     eGFR's persistently <90 mL/min signify possible Chronic Kidney     Disease.  MAGNESIUM     Status: None   Collection Time    08/22/13 11:25 AM  Result Value Ref Range   Magnesium 2.2  1.5 - 2.5 mg/dL  PHOSPHORUS     Status: None   Collection Time    08/22/13 11:25 AM      Result Value Ref Range    Phosphorus 3.6  2.3 - 4.6 mg/dL  APTT     Status: Abnormal   Collection Time    08/22/13 11:25 AM      Result Value Ref Range   aPTT 59 (*) 24 - 37 seconds   Comment:            IF BASELINE aPTT IS ELEVATED,     SUGGEST PATIENT RISK ASSESSMENT     BE USED TO DETERMINE APPROPRIATE     ANTICOAGULANT THERAPY.  CBC WITH DIFFERENTIAL     Status: Abnormal   Collection Time    08/22/13 11:25 AM      Result Value Ref Range   WBC 11.7 (*) 4.0 - 10.5 K/uL   RBC 3.49 (*) 3.87 - 5.11 MIL/uL   Hemoglobin 9.3 (*) 12.0 - 15.0 g/dL   HCT 28.5 (*) 36.0 - 46.0 %   MCV 81.7  78.0 - 100.0 fL   MCH 26.6  26.0 - 34.0 pg   MCHC 32.6  30.0 - 36.0 g/dL   RDW 14.7  11.5 - 15.5 %   Platelets 369  150 - 400 K/uL   Neutrophils Relative % 60  43 - 77 %   Neutro Abs 7.1  1.7 - 7.7 K/uL   Lymphocytes Relative 26  12 - 46 %   Lymphs Abs 3.0  0.7 - 4.0 K/uL   Monocytes Relative 11  3 - 12 %   Monocytes Absolute 1.3 (*) 0.1 - 1.0 K/uL   Eosinophils Relative 3  0 - 5 %   Eosinophils Absolute 0.3  0.0 - 0.7 K/uL   Basophils Relative 0  0 - 1 %   Basophils Absolute 0.0  0.0 - 0.1 K/uL  PROTIME-INR     Status: Abnormal   Collection Time    08/22/13 11:25 AM      Result Value Ref Range   Prothrombin Time 31.6 (*) 11.6 - 15.2 seconds   INR 3.20 (*) 0.00 - 1.49  TSH     Status: None   Collection Time    08/22/13 11:25 AM      Result Value Ref Range   TSH 3.610  0.350 - 4.500 uIU/mL   Comment: Please note change in reference range.     Performed at [facility]  GLUCOSE, CAPILLARY     Status: Abnormal   Collection Time    08/22/13 11:53 AM      Result Value Ref Range   Glucose-Capillary 119 (*) 70 - 99 mg/dL   Ct Abdomen Pelvis Wo Contrast  08/22/2013   CLINICAL DATA:  Abdominal pain, back pain. Will a small bowel obstruction.  EXAM: CT ABDOMEN AND PELVIS WITHOUT CONTRAST  TECHNIQUE: Multidetector CT imaging of the abdomen and pelvis was performed following the standard protocol without intravenous  contrast.  COMPARISON:  DG ABD ACUTE W/CHEST dated 08/22/2013; CT ABD/PELV WO CM dated 08/15/2013  FINDINGS: Heart is normal size. Linear areas of atelectasis in the lung bases. No effusions.  Prior cholecystectomy. Liver, spleen, right adrenal gland have an unremarkable unenhanced appearance. Mild fatty replacement of the pancreas without focal abnormality. Small nodule in the left adrenal body measuring 13 mm and with a density of -12 Hounsfield units, compatible with adenoma.  Mild cortical thinning within the kidneys without hydronephrosis or visible focal abnormality on this unenhanced study.  There are slightly dilated and fluid-filled small bowel loops in the abdomen and pelvis. Scattered air-fluid levels. The distal small bowel loops are decompressed. Findings suspicious for early or low grade partial small bowel obstruction. The exact transition point or cause is not visualized. No free fluid or free air.  Suprapubic catheter is in place with the urinary bladder decompressed. Scattered diverticula throughout the colon, most pronounced in the sigmoid colon. No evidence of diverticulitis. No free fluid, free air or adenopathy. Appendix is visualized and is normal.  Aorta and iliac vessels are calcified, non aneurysmal.  No acute bony abnormality. Degenerative changes in the thoracolumbar spine. Mild compression fracture of the L1 superior endplate which is stable since prior CT.  Stool burden in the rectosigmoid colon has decreased since prior study.  IMPRESSION: Slight prominence of abdominal and pelvic small bowel loops with decompressed distal small bowel. Findings may reflect early or low grade partial small bowel obstruction.  Decreasing rectum sigmoid stool burden.  Colonic diverticulosis.  Suprapubic catheter remains in the bladder which is decompressed.   Electronically Signed   By: Rolm Baptise M.D.   On: 08/22/2013 09:05   Dg Abd Acute W/chest  08/22/2013   CLINICAL DATA:  Abdominal pain.  EXAM: ACUTE  ABDOMEN SERIES (ABDOMEN 2 VIEW & CHEST 1 VIEW)  COMPARISON:  Chest radiograph performed 02/28/2013, and CT of the abdomen and pelvis performed 08/15/2013  FINDINGS: The lungs are well-aerated. Mild vascular congestion is noted. There is no evidence of focal opacification, pleural effusion or pneumothorax. The cardiomediastinal silhouette is within normal limits. There is chronic elevation of the lateral left hemidiaphragm.  There is new distention of small bowel loops to 4.2 cm in diameter, raising concern for some degree of small bowel obstruction. Residual air and contrast is noted within the colon. No free intra-abdominal air is identified on the provided decubitus view.  No acute osseous abnormalities are seen; the sacroiliac joints are unremarkable in appearance.  IMPRESSION: 1. New distention of small bowel loops to 4.2 cm in maximal diameter, raising concern for some degree of small bowel obstruction. Alternately, this could reflect mild ileus. No free intra-abdominal air seen. 2. Mild vascular congestion noted; lungs remain grossly clear.   Electronically Signed   By: Garald Balding M.D.   On: 08/22/2013 06:37      Assessment/Plan Abdominal pain Abdominal distension Constipation ? Early SBO  Plan: 1.  Hopefully will resolve conservatively without surgical intervention 2.  NPO, IVF, pain control, antiemetics, enemas okay, may need redigitalized 3.  SCD's and heparin for DVT proph okay with [place] 4.  No immediate surgical interventions, low threshold for NG tube if she becomes nauseated 5.  PT/OT would be beneficial since she has been having trouble with mobility and ambulation.  It sounds like she was supposed to d/c home to rehab last time but refused.    Darlyne Russian, PA-S [facility] Surgery 08/22/2013, 2:51 PM Office# [phone]

## 2013-08-22 NOTE — ED Notes (Signed)
Pt seen at [facility] for bowel impaction and UTI was discharged last Thursday and has been oral antibiotics. Per pt family pt has had distended abdomen since stay in the hospital. Pt also has suprapubic catheter.

## 2013-08-22 NOTE — Progress Notes (Signed)
Bilateral lower extremity venous duplex:  No evidence of DVT, superficial thrombosis, or Baker's Cyst.   

## 2013-08-22 NOTE — Progress Notes (Signed)
ANTICOAGULATION CONSULT NOTE - Initial Consult  Pharmacy Consult for warfarin Indication: History of PE and DVT; history of atrial fibrillation  Allergies  Allergen Reactions  . Humibid La [Guaifenesin] Shortness Of Breath  . Latex Hives  . Detrol [Tolterodine Tartrate] Hives  . Keflex [Cephalexin]     hives  . Vioxx [Rofecoxib] Hives    Patient Measurements:    Vital Signs: Temp: 98.3 F (36.8 C) (04/06 0537) Temp src: Oral (04/06 0537) BP: 150/59 mmHg (04/06 1000) Pulse Rate: 73 (04/06 0931)  Labs:  Recent Labs  08/22/13 0630 08/22/13 0735  HGB 10.2*  --   HCT 31.6*  --   PLT 385  --   LABPROT  --  32.6*  INR  --  3.33*  CREATININE 1.94*  --     The CrCl is unknown because both a height and weight (above a minimum accepted value) are required for this calculation.   Medical History: Past Medical History  Diagnosis Date  . Hypertension   . GERD (gastroesophageal reflux disease)   . Diabetes mellitus   . History of atrial fibrillation EPISODE YRS AGO  . Urinary retention     s/p suprapubic catheter November 2013  . Coronary artery disease cardiologist- dr Maylon Costennent--  lov 09-12-2010 in epic    non-obstructinve min. cad  . Foley catheter in place   . Edema of lower extremity BILATERAL LEG  . Hyperlipidemia   . History of pulmonary embolus (PE) 2010--  TAKES COUMADIN  . History of DVT of lower extremity   . Anticoagulated on Coumadin   . Generalized weakness AGE RELATED--  USES WALKER  . Vertigo   . OA (osteoarthritis)   . Thin skin FRAGILE  . Hypothyroidism NO MEDS  . Impaired hearing BILATERAL AIDS  . Chronic diarrhea INTERMITTANT    Medications:  Scheduled:  . sodium chloride   Intravenous STAT  . diltiazem  120 mg Oral BID  . insulin aspart  0-15 Units Subcutaneous TID WC  . insulin aspart  0-5 Units Subcutaneous QHS  . [START ON 08/23/2013] lidocaine  1 patch Transdermal Q24H  . methimazole  5 mg Oral Daily  . metoCLOPramide (REGLAN)  injection  2.5 mg Intravenous Q12H  . mometasone-formoterol  2 puff Inhalation BID   Infusions:  . sodium chloride     PRN: acetaminophen, acetaminophen, LORazepam, morphine injection, ondansetron (ZOFRAN) IV, ondansetron  Assessment: 80 y/o F on chronic warfarin therapy for hx VTE (also hx AFib) presented to ED with back and abdominal pain.  INR supratherapeutic (3.33) on admission.  Orders received for warfarin with pharmacy dosing assistance. Most recent warfarin maintenance dosage reported as 3mg  daily except 2mg  Tuesdays, Thursdays, and Saturdays; last taken 4/5.  Concomitant methimazole noted. However, with patient on this medication chronically and TSH WNL when last checked (08/15/13), there should be no clinically significant interaction with warfarin therapy.   Goal of Therapy:  INR 2-3    Plan:  1. No warfarin today. 2. Follow PT/INR daily while inpatient.  Elie Goodyandy Vera Furniss, PharmD, BCPS Pager: [phone] 08/22/2013  11:41 AM

## 2013-08-22 NOTE — Progress Notes (Signed)
Clinical Social Work Department BRIEF PSYCHOSOCIAL ASSESSMENT 08/22/2013  Patient:  Krystal [name], Krystal [name]     Account Number:  [id number]     Admit date:  08/22/2013  Clinical Social Worker:  Lacie Scotts  Date/Time:  08/22/2013 01:04 PM  Referred by:  Physician  Date Referred:  08/22/2013 Referred for  SNF Placement   Other Referral:   Interview type:  Family Other interview type:    PSYCHOSOCIAL DATA Living Status:  ALONE Admitted from facility:   Level of care:   Primary support name:  Krystal [name] Primary support relationship to patient:  CHILD, ADULT Degree of support available:   supportive    CURRENT CONCERNS Current Concerns  Post-Acute Placement   Other Concerns:    SOCIAL WORK ASSESSMENT / PLAN Pt is an 80 yr old female hospitalized at [facility] on 08/15/13 with sepsis and d/c home on 08/18/13. PT had evaluated pt prior to d/c and recommended SNF placement. Pt had declined SNF and went home with [facility] services. Pt has been readmitted today with a SBO. CSW met with family this am to assist with d/c planning. Pt was sleeping at the time of visit. Daughter feels SNF placement will be needed following hospital d/c. CSW has given daughter a SNF list and a search will be initiated once H&P or PN is available. CSW will continue to follow to assist with d/c planning.   Assessment/plan status:  Psychosocial Support/Ongoing Assessment of Needs Other assessment/ plan:   Information/referral to community resources:   SNF list has been provided.    PATIENT'S/FAMILY'S RESPONSE TO PLAN OF CARE: " My mother thinks she is in a SNF and she says she will only stay 2 weeks". Daughter states the past weekend was very difficult . Following this hospital d/c pt will need to go to rehab, according to daughter. CSW will assist with this plan.   [name] LCSW [phone]

## 2013-08-22 NOTE — ED Notes (Signed)
Pt family states their main concern is pt severe back pain.

## 2013-08-22 NOTE — Progress Notes (Signed)
UR completed 

## 2013-08-23 DIAGNOSIS — I2510 Atherosclerotic heart disease of native coronary artery without angina pectoris: Secondary | ICD-10-CM

## 2013-08-23 DIAGNOSIS — N184 Chronic kidney disease, stage 4 (severe): Secondary | ICD-10-CM

## 2013-08-23 LAB — COMPREHENSIVE METABOLIC PANEL
ALT: 13 U/L (ref 0–35)
AST: 22 U/L (ref 0–37)
Albumin: 2.9 g/dL — ABNORMAL LOW (ref 3.5–5.2)
Alkaline Phosphatase: 99 U/L (ref 39–117)
BUN: 30 mg/dL — AB (ref 6–23)
CO2: 25 meq/L (ref 19–32)
CREATININE: 1.84 mg/dL — AB (ref 0.50–1.10)
Calcium: 8.3 mg/dL — ABNORMAL LOW (ref 8.4–10.5)
Chloride: 96 mEq/L (ref 96–112)
GFR, EST AFRICAN AMERICAN: 27 mL/min — AB (ref >90)
GFR, EST NON AFRICAN AMERICAN: 24 mL/min — AB (ref >90)
GLUCOSE: 153 mg/dL — AB (ref 70–99)
Potassium: 4 mEq/L (ref 3.7–5.3)
Sodium: 135 mEq/L — ABNORMAL LOW (ref 137–147)
TOTAL PROTEIN: 6.6 g/dL (ref 6.0–8.3)
Total Bilirubin: 0.4 mg/dL (ref 0.3–1.2)

## 2013-08-23 LAB — PROTIME-INR
INR: 3.06 — AB (ref 0.00–1.49)
PROTHROMBIN TIME: 30.5 s — AB (ref 11.6–15.2)

## 2013-08-23 LAB — CBC
HEMATOCRIT: 28.4 % — AB (ref 36.0–46.0)
HEMOGLOBIN: 9.4 g/dL — AB (ref 12.0–15.0)
MCH: 27.1 pg (ref 26.0–34.0)
MCHC: 33.1 g/dL (ref 30.0–36.0)
MCV: 81.8 fL (ref 78.0–100.0)
Platelets: 358 10*3/uL (ref 150–400)
RBC: 3.47 MIL/uL — AB (ref 3.87–5.11)
RDW: 14.9 % (ref 11.5–15.5)
WBC: 11.9 10*3/uL — AB (ref 4.0–10.5)

## 2013-08-23 LAB — GLUCOSE, CAPILLARY
GLUCOSE-CAPILLARY: 119 mg/dL — AB (ref 70–99)
Glucose-Capillary: 144 mg/dL — ABNORMAL HIGH (ref 70–99)
Glucose-Capillary: 113 mg/dL — ABNORMAL HIGH (ref 70–99)
Glucose-Capillary: 110 mg/dL — ABNORMAL HIGH (ref 70–99)

## 2013-08-23 MED ORDER — PIPERACILLIN-TAZOBACTAM 3.375 G IVPB
3.3750 g | Freq: Three times a day (TID) | INTRAVENOUS | Status: DC
  Administered 2013-08-23 – 2013-08-25 (×7): 3.375 g via INTRAVENOUS
  Filled 2013-08-23 (×8): qty 50

## 2013-08-23 MED ORDER — LOSARTAN POTASSIUM 50 MG PO TABS
100.0000 mg | ORAL_TABLET | Freq: Every day | ORAL | Status: DC
  Administered 2013-08-23 – 2013-08-24 (×2): 100 mg via ORAL
  Filled 2013-08-23 (×3): qty 2

## 2013-08-23 NOTE — Progress Notes (Addendum)
[facility] PROGRESS NOTE  Alonza SmokerLouise P Men ZOX:[id number] DOB: 20-Mar-1925 DOA: 08/22/2013 PCP: Mickie HillierLITTLE,[name], MD  Brief narrative: 80 year old female with past medical history of hypertension, atrial fibrillation, DVT and PE on coumadin, diabetes, recent hospitalization for UTI due to serratia (was supposed to be on cipro for 10 days after discharge 4/2) who [facility]/2013 with worsening mental status changed in past 24 hours prior to this admission. Pt is not a good historian as she is lethargic at this time. She was given morphine for pain in ED. Patient was apparently (per her daughters) hallucinating and thinking someone has robbed her house and then she did not know where she was and could not recognize her daughters.  In ED, vitals are stable. Blood work revealed WBC count 10.9, hemoglobin 10.2, creatinine 1.94. Abdominal x ray showed new distention of small bowel loops possible small bowel obstruction. This was also seen on CT abdomen.   Assessment and Plan:   Principal Problem:  Acute encephalopathy  Possible progressive dementia but also UTI and cellulitis of LE. We started zosyn for UTI (for 5 more days which would complete 10 day abx tx from previous admission, treatment for Serratia UTI). We started doxycycline for LE cellulitis. She does have significant erythema but seems to be better since the admission. PT eval once she is able to participate.   Active Problems:  SBO (small bowel obstruction)  Conservative management with IV fluids, n.p.o., antiemetics and analgesia. Patient is also on Reglan 2.5 mg IV every 12 hours. Bowel regimen is with Colace, MiraLAX and enema. She had one bowel movement since the admission. Diabetes mellitus type 2, uncontrolled  We will hold off on by mouth medications until small bowel obstruction resolves. Until then we will use a sliding scale insulin. CBG's in past 24 hours: 137, 180, 144 Lower extremity edema  Lower extremity  Doppler was negative for DVT. We are holding off on Lasix because patient is getting IV fluids because she is n.p.o. due to small bowel obstruction. IV fluids are at 50 cc an hour. We will reassess in next 24 hours if IV fluids are still needed.  Atrial fibrillation  Rate controlled with Cardizem. Coumadin dosing per pharmacy. History of pulmonary embolism  Coumadin dosing per pharmacy. HTN (hypertension)  Patient is currently on Cardizem 120 mg twice a day as well as losartan 100 mg daily. Leukocytosis, unspecified  Secondary to UTI. LE cellulitis  Anemia of chronic disease  Secondary to history of CKD. Hemoglobin is stable at 9.4. No current indications for transfusion. Chronic kidney disease (CKD), stage IV (severe)  Creatinine 2.11 on 08/17/2013 and on this admission 1.9. Creatinine is 1.84 this morning. Continue low rate IV fluids.  Code Status: Full  Family Communication: Daughters at the bedside  Disposition Plan: Remains inpatient   Manson PasseyEVINE, Robbie Nangle, MD  [facility] Pager [phone]  If 7PM-7AM, please contact night-coverage [email or web address] Password [email or web address] 08/23/2013, 11:45 AM   LOS: 1 day   Consultants:  Surgery  Procedures:  LE bilateral venous doppler - no evidence of DVT (08/22/2013)  Antibiotics:  Zosyn 08/22/2013 -->  Doxycycline 08/22/2013 -->   HPI/Subjective: No overnight events.  Objective: Filed Vitals:   08/22/13 2023 08/23/13 0513 08/23/13 0623 08/23/13 0858  BP: 137/73 168/74    Pulse: 91 89    Temp: 98.5 F (36.9 C) 97.2 F (36.2 C)    TempSrc: Oral Oral    Resp: 18 18    Height:  Weight:   103.6 kg (228 lb 6.3 oz)   SpO2: 99% 92%  98%    Intake/Output Summary (Last 24 hours) at 08/23/13 1145 Last data filed at 08/23/13 1006  Gross per 24 hour  Intake 1576.25 ml  Output   1851 ml  Net -274.75 ml    Exam:   General:  Pt is sleeping this am  Cardiovascular: Irregular rhythm, S1/S2 appreciated  Respiratory: Clear to  auscultation bilaterally, no wheezing  Abdomen: Distended, nontender, bowel sounds present, no guarding  Extremities: Lower extremity edema and erythema appreciated, pulses DP and PT palpable bilaterally  Neuro: Grossly nonfocal  Data Reviewed: Basic Metabolic Panel:  Recent Labs Lab 08/17/13 0550 08/18/13 0820 08/22/13 0630 08/22/13 1125 08/23/13 0417  NA 136* 137 136* 135* 135*  K 4.1 4.0 4.2 3.9 4.0  CL 97 100 94* 95* 96  CO2 25 28 28 26 25   GLUCOSE 209* 137* 164* 130* 153*  BUN 49* 31* 28* 28* 30*  CREATININE 2.18* 1.68* 1.94* 1.86* 1.84*  CALCIUM 8.3* 8.1* 8.7 8.2* 8.3*  MG 3.3* 2.8*  --  2.2  --   PHOS  --   --   --  3.6  --    Liver Function Tests:  Recent Labs Lab 08/17/13 0550 08/22/13 1125 08/23/13 0417  AST 15 22 22   ALT 7 13 13   ALKPHOS 110 96 99  BILITOT 0.3 0.3 0.4  PROT 6.4 6.9 6.6  ALBUMIN 2.7* 2.9* 2.9*   No results found for this basename: LIPASE, AMYLASE,  in the last 168 hours No results found for this basename: AMMONIA,  in the last 168 hours CBC:  Recent Labs Lab 08/17/13 0550 08/18/13 0820 08/22/13 0630 08/22/13 1125 08/23/13 0417  WBC 13.7* 11.0* 10.9* 11.7* 11.9*  NEUTROABS 11.3* 8.3* 7.5 7.1  --   HGB 9.9* 8.6* 10.2* 9.3* 9.4*  HCT 29.6* 26.9* 31.6* 28.5* 28.4*  MCV 82.2 83.8 81.7 81.7 81.8  PLT 301 267 385 369 358   Cardiac Enzymes: No results found for this basename: CKTOTAL, CKMB, CKMBINDEX, TROPONINI,  in the last 168 hours BNP: No components found with this basename: POCBNP,  CBG:  Recent Labs Lab 08/18/13 1212 08/22/13 1153 08/22/13 1609 08/22/13 2131 08/23/13 0724  GLUCAP 202* 119* 137* 180* 144*    Recent Results (from the past 240 hour(s))  URINE CULTURE     Status: None   Collection Time    08/15/13 12:32 PM      Result Value Ref Range Status   Specimen Description URINE, SUPRAPUBIC   Final   Special Requests Normal   Final   Culture  Setup Time     Final   Value: 08/15/2013 17:46     Performed  at [facility] Count     Final   Value: >=100,000 COLONIES/ML     Performed at [facility]   Culture     Final   Value: SERRATIA MARCESCENS     Performed at [facility]   Report Status 08/17/2013 FINAL   Final   Organism ID, Bacteria SERRATIA MARCESCENS   Final  URINE CULTURE     Status: None   Collection Time    08/15/13 12:32 PM      Result Value Ref Range Status   Specimen Description URINE, CATHETERIZED   Final   Special Requests NONE   Final   Culture  Setup Time     Final   Value: 08/15/2013  21:39     Performed at [facility] Count     Final   Value: >=100,000 COLONIES/ML     Performed at [facility]   Culture     Final   Value: SERRATIA MARCESCENS     Performed at [facility]   Report Status 08/18/2013 FINAL   Final   Organism ID, Bacteria SERRATIA MARCESCENS   Final     Studies: Ct Abdomen Pelvis Wo Contrast 08/22/2013    IMPRESSION: Slight prominence of abdominal and pelvic small bowel loops with decompressed distal small bowel. Findings may reflect early or low grade partial small bowel obstruction.  Decreasing rectum sigmoid stool burden.  Colonic diverticulosis.  Suprapubic catheter remains in the bladder which is decompressed.   Electronically Signed   By: Charlett Nose M.D.   On: 08/22/2013 09:05   Dg Abd Acute W/chest 08/22/2013    IMPRESSION: 1. New distention of small bowel loops to 4.2 cm in maximal diameter, raising concern for some degree of small bowel obstruction. Alternately, this could reflect mild ileus. No free intra-abdominal air seen. 2. Mild vascular congestion noted; lungs remain grossly clear.   Electronically Signed   By: Roanna Raider M.D.   On: 08/22/2013 06:37    Scheduled Meds: . diltiazem  120 mg Oral BID  . docusate sodium  100 mg Oral BID  . doxycycline   100 mg Intravenous Q12H  . insulin aspart  0-15 Units Subcutaneous TID WC  . insulin aspart  0-5 Units Subcutaneous  QHS  . lidocaine  1 patch Transdermal Q24H  . methimazole  5 mg Oral Daily  . metoCLOPramide   2.5 mg Intravenous Q12H  . mometasone-formoterol  2 puff Inhalation BID  . piperacillin-tazobactam   3.375 g Intravenous 3 times per day  . polyethylene glycol  17 g Oral BID  . Warfarin    Does not apply q1800   Continuous Infusions: . sodium chloride 50 mL/hr at 08/22/13 1657

## 2013-08-23 NOTE — Progress Notes (Signed)
Patient is confused alert to person only, vital signs are stable, left lower leg still red, family at bedside and supportive, patient became extremely agitated this afternoon and was medicated with ativan .25mg  iv, patient now resting quietly, daughter was update on plan of care for patient and patient progress MD Elisabeth Pigeonevine paged and notified, will continue to monitor patient Stanford BreedBracey, Ellington Cornia N RN 08-23-2013 18:57pm

## 2013-08-23 NOTE — Progress Notes (Signed)
ANTICOAGULATION CONSULT NOTE - Follow up  Pharmacy Consult for warfarin Indication: History of PE and DVT; history of atrial fibrillation  Allergies  Allergen Reactions  . Humibid La [Guaifenesin] Shortness Of Breath  . Latex Hives  . Detrol [Tolterodine Tartrate] Hives  . Keflex [Cephalexin]     hives  . Vioxx [Rofecoxib] Hives    Patient Measurements: Height: 4' 11.84" (152 cm) Weight: 228 lb 6.3 oz (103.6 kg) IBW/kg (Calculated) : 45.14  Vital Signs: Temp: 97.2 F (36.2 C) (04/07 0513) Temp src: Oral (04/07 0513) BP: 168/74 mmHg (04/07 0513) Pulse Rate: 89 (04/07 0513)  Labs:  Recent Labs  08/22/13 0630 08/22/13 0735 08/22/13 1125 08/23/13 0417  HGB 10.2*  --  9.3* 9.4*  HCT 31.6*  --  28.5* 28.4*  PLT 385  --  369 358  APTT  --   --  59*  --   LABPROT  --  32.6* 31.6* 30.5*  INR  --  3.33* 3.20* 3.06*  CREATININE 1.94*  --  1.86* 1.84*    Estimated Creatinine Clearance: 23.3 ml/min (by C-G formula based on Cr of 1.84).  Assessment: 80 y/o F on chronic warfarin therapy for hx VTE (also hx AFib) presented to ED with back and abdominal pain.  INR supratherapeutic (3.33) on admission.  Orders received for warfarin with pharmacy dosing assistance. Most recent warfarin maintenance dosage reported as 3mg  daily except 2mg  Tuesdays, Thursdays, and Saturdays; last taken 4/5.  Concomitant methimazole noted. However, with patient on this medication chronically and TSH WNL when last checked (08/15/13), there should be no clinically significant interaction with warfarin therapy.  INR better this AM but still supratherapeutic.  CBC ok. No bleeding reported/documented.  NPO.   Broad-spectrum abx likely to increase sensitivity to warfarin.  Goal of Therapy:  INR 2-3   Plan:   No warfarin again today.  Follow PT/INR daily while inpatient.  Charolotte Ekeom Brizeida Mcmurry, PharmD, pager [phone]. 08/23/2013,10:39 AM.

## 2013-08-23 NOTE — Progress Notes (Signed)
Clinical Social Work Department CLINICAL SOCIAL WORK PLACEMENT NOTE 08/23/2013  Patient:  Krystal [name],Krystal [name]  Account Number:  [id number] Admit date:  08/22/2013  Clinical Social Worker:  Cori RazorJAMIE Maddon Horton, LCSW  Date/time:  08/23/2013 08:09 AM  Clinical Social Work is seeking post-discharge placement for this patient at the following level of care:   SKILLED NURSING   (*CSW will update this form in Epic as items are completed)   08/23/2013  Patient/family provided with [facility] of Clinical [facility] Work's list of facilities offering this level of care within the geographic area requested by the patient (or if unable, by the patient's family).  08/23/2013  Patient/family informed of their freedom to choose among providers that offer the needed level of care, that participate in Medicare, Medicaid or managed care program needed by the patient, have an available bed and are willing to accept the patient.    Patient/family informed of [facility]' ownership interest in [facility], as well as of the fact that they are under no obligation to receive care at this facility.  PASARR submitted to EDS on 08/23/2013 PASARR number received from EDS on 08/23/2013  FL2 transmitted to all facilities in geographic area requested by pt/family on  08/23/2013 FL2 transmitted to all facilities within larger geographic area on   Patient informed that his/her managed care company has contracts with or will negotiate with  certain facilities, including the following:     Patient/family informed of bed offers received:   Patient chooses bed at  Physician recommends and patient chooses bed at    Patient to be transferred to  on   Patient to be transferred to facility by   The following physician request were entered in Epic:   Additional Comments:  Cori RazorJamie Rodney Wigger LCSW [phone]

## 2013-08-23 NOTE — Progress Notes (Signed)
Subjective: Pt not in much pain.  No N/V.  Abdominal pain appears improved, distension still there.  Very confused and rambles about people not charging her batteries.  Had a BM yesterday.    Objective: Vital signs in last 24 hours: Temp:  [97.2 F (36.2 C)-98.5 F (36.9 C)] 97.2 F (36.2 C) (04/07 0513) Pulse Rate:  [73-91] 89 (04/07 0513) Resp:  [13-18] 18 (04/07 0513) BP: (131-168)/(59-80) 168/74 mmHg (04/07 0513) SpO2:  [92 %-99 %] 98 % (04/07 0858) Weight:  [226 lb 10.1 oz (102.8 kg)-228 lb 6.3 oz (103.6 kg)] 228 lb 6.3 oz (103.6 kg) (04/07 1610) Last BM Date: 08/22/13  Intake/Output from previous day: 04/06 0701 - 04/07 0700 In: 1576.3 [I.V.:1026.3; IV Piggyback:550] Out: 1051 [Urine:1050; Stool:1] Intake/Output this shift: Total I/O In: -  Out: 600 [Urine:600]  PE: Gen:  Alert, NAD, pleasant Abd: Soft, still distended, but mildly improved, NT to palpation, +BS, no HSM   Lab Results:   Recent Labs  08/22/13 1125 08/23/13 0417  WBC 11.7* 11.9*  HGB 9.3* 9.4*  HCT 28.5* 28.4*  PLT 369 358   BMET  Recent Labs  08/22/13 1125 08/23/13 0417  NA 135* 135*  K 3.9 4.0  CL 95* 96  CO2 26 25  GLUCOSE 130* 153*  BUN 28* 30*  CREATININE 1.86* 1.84*  CALCIUM 8.2* 8.3*   PT/INR  Recent Labs  08/22/13 1125 08/23/13 0417  LABPROT 31.6* 30.5*  INR 3.20* 3.06*   CMP     Component Value Date/Time   NA 135* 08/23/2013 0417   K 4.0 08/23/2013 0417   CL 96 08/23/2013 0417   CO2 25 08/23/2013 0417   GLUCOSE 153* 08/23/2013 0417   BUN 30* 08/23/2013 0417   CREATININE 1.84* 08/23/2013 0417   CALCIUM 8.3* 08/23/2013 0417   PROT 6.6 08/23/2013 0417   ALBUMIN 2.9* 08/23/2013 0417   AST 22 08/23/2013 0417   ALT 13 08/23/2013 0417   ALKPHOS 99 08/23/2013 0417   BILITOT 0.4 08/23/2013 0417   GFRNONAA 24* 08/23/2013 0417   GFRAA 27* 08/23/2013 0417   Lipase     Component Value Date/Time   LIPASE 18 08/15/2013 1205       Studies/Results: Ct Abdomen Pelvis Wo Contrast  08/22/2013    CLINICAL DATA:  Abdominal pain, back pain. Will a small bowel obstruction.  EXAM: CT ABDOMEN AND PELVIS WITHOUT CONTRAST  TECHNIQUE: Multidetector CT imaging of the abdomen and pelvis was performed following the standard protocol without intravenous contrast.  COMPARISON:  DG ABD ACUTE W/CHEST dated 08/22/2013; CT ABD/PELV WO CM dated 08/15/2013  FINDINGS: Heart is normal size. Linear areas of atelectasis in the lung bases. No effusions.  Prior cholecystectomy. Liver, spleen, right adrenal gland have an unremarkable unenhanced appearance. Mild fatty replacement of the pancreas without focal abnormality. Small nodule in the left adrenal body measuring 13 mm and with a density of -12 Hounsfield units, compatible with adenoma. Mild cortical thinning within the kidneys without hydronephrosis or visible focal abnormality on this unenhanced study.  There are slightly dilated and fluid-filled small bowel loops in the abdomen and pelvis. Scattered air-fluid levels. The distal small bowel loops are decompressed. Findings suspicious for early or low grade partial small bowel obstruction. The exact transition point or cause is not visualized. No free fluid or free air.  Suprapubic catheter is in place with the urinary bladder decompressed. Scattered diverticula throughout the colon, most pronounced in the sigmoid colon. No evidence of diverticulitis. No free fluid,  free air or adenopathy. Appendix is visualized and is normal.  Aorta and iliac vessels are calcified, non aneurysmal.  No acute bony abnormality. Degenerative changes in the thoracolumbar spine. Mild compression fracture of the L1 superior endplate which is stable since prior CT.  Stool burden in the rectosigmoid colon has decreased since prior study.  IMPRESSION: Slight prominence of abdominal and pelvic small bowel loops with decompressed distal small bowel. Findings may reflect early or low grade partial small bowel obstruction.  Decreasing rectum sigmoid stool  burden.  Colonic diverticulosis.  Suprapubic catheter remains in the bladder which is decompressed.   Electronically Signed   By: Charlett NoseKevin  Dover M.D.   On: 08/22/2013 09:05   Dg Abd Acute W/chest  08/22/2013   CLINICAL DATA:  Abdominal pain.  EXAM: ACUTE ABDOMEN SERIES (ABDOMEN 2 VIEW & CHEST 1 VIEW)  COMPARISON:  Chest radiograph performed 02/28/2013, and CT of the abdomen and pelvis performed 08/15/2013  FINDINGS: The lungs are well-aerated. Mild vascular congestion is noted. There is no evidence of focal opacification, pleural effusion or pneumothorax. The cardiomediastinal silhouette is within normal limits. There is chronic elevation of the lateral left hemidiaphragm.  There is new distention of small bowel loops to 4.2 cm in diameter, raising concern for some degree of small bowel obstruction. Residual air and contrast is noted within the colon. No free intra-abdominal air is identified on the provided decubitus view.  No acute osseous abnormalities are seen; the sacroiliac joints are unremarkable in appearance.  IMPRESSION: 1. New distention of small bowel loops to 4.2 cm in maximal diameter, raising concern for some degree of small bowel obstruction. Alternately, this could reflect mild ileus. No free intra-abdominal air seen. 2. Mild vascular congestion noted; lungs remain grossly clear.   Electronically Signed   By: Roanna RaiderJeffery  Chang M.D.   On: 08/22/2013 06:37    Anti-infectives: Anti-infectives   Start     Dose/Rate Route Frequency Ordered Stop   08/23/13 1600  piperacillin-tazobactam (ZOSYN) IVPB 3.375 g     3.375 g 12.5 mL/hr over 240 Minutes Intravenous 3 times per day 08/23/13 0638     08/22/13 1800  piperacillin-tazobactam (ZOSYN) IVPB 3.375 g  Status:  Discontinued     3.375 g 12.5 mL/hr over 240 Minutes Intravenous 3 times per day 08/22/13 1743 08/23/13 0638   08/22/13 1700  doxycycline (VIBRAMYCIN) 100 mg in dextrose 5 % 250 mL IVPB     100 mg 125 mL/hr over 120 Minutes Intravenous  Every 12 hours 08/22/13 1603         Assessment/Plan Abdominal pain  Abdominal distension  Constipation ? Early SBO   Plan:  1. Had 1 BM yesterday.  Hopefully will resolve conservatively without surgical intervention  2. NPO, IVF, pain control, antiemetics, enemas okay, may need redigitalized  3. SCD's and heparin for DVT proph okay with us  4. No immediate surgical interventions, low threshold for NG tube if she becomes nauseated  5. PT/OT would be beneficial since she has been having trouble with mobility and ambulation. It sounds like she was supposed to d/c home to rehab last time but refused.      LOS: 1 day    Aris GeorgiaDORT, Crystale Giannattasio 08/23/2013, 9:23 AM Pager: [phone]

## 2013-08-24 ENCOUNTER — Inpatient Hospital Stay (HOSPITAL_COMMUNITY): Payer: Medicare Other

## 2013-08-24 LAB — GLUCOSE, CAPILLARY
GLUCOSE-CAPILLARY: 133 mg/dL — AB (ref 70–99)
Glucose-Capillary: 135 mg/dL — ABNORMAL HIGH (ref 70–99)
Glucose-Capillary: 87 mg/dL (ref 70–99)
Glucose-Capillary: 106 mg/dL — ABNORMAL HIGH (ref 70–99)

## 2013-08-24 LAB — PROTIME-INR
INR: 2.69 — ABNORMAL HIGH (ref 0.00–1.49)
Prothrombin Time: 27.7 seconds — ABNORMAL HIGH (ref 11.6–15.2)

## 2013-08-24 MED ORDER — WARFARIN SODIUM 2 MG PO TABS
2.0000 mg | ORAL_TABLET | Freq: Once | ORAL | Status: DC
  Filled 2013-08-24: qty 1

## 2013-08-24 MED ORDER — FLEET ENEMA 7-19 GM/118ML RE ENEM: 1.0000 | ENEMA | Freq: Once | RECTAL | Status: DC

## 2013-08-24 MED ORDER — BISACODYL 10 MG RE SUPP
10.0000 mg | Freq: Every day | RECTAL | Status: DC
  Administered 2013-08-24 – 2013-08-30 (×7): 10 mg via RECTAL
  Filled 2013-08-24 (×7): qty 1

## 2013-08-24 NOTE — Progress Notes (Signed)
[facility] PROGRESS NOTE  Krystal [name]:[id number] DOB: 1924/07/25 DOA: 08/22/2013 PCP: Mickie Hillier, MD  Brief narrative: 80 year old female with past medical history of hypertension, atrial fibrillation, DVT and PE on coumadin, diabetes, recent hospitalization for UTI due to serratia (was supposed to be on cipro for 10 days after discharge 4/2) who presented to [facility] ED 08/22/2013 with worsening mental status changed in past 24 hours prior to this admission. Pt is not a good historian as she is lethargic at this time. She was given morphine for pain in ED. Patient was apparently (per her daughters) hallucinating and thinking someone has robbed her house and then she did not know where she was and could not recognize her daughters.  In ED, vitals are stable. Blood work revealed WBC count 10.9, hemoglobin 10.2, creatinine 1.94. Abdominal x ray showed new distention of small bowel loops possible small bowel obstruction. This was also seen on CT abdomen.  Repeat abd film 4/8, with worsened SBO. Will place NG tube.  Assessment and Plan:   Acute encephalopathy  Possible progressive dementia but also UTI. Cx from 3/30 with serratia. Transition to cipro for 5 more days. Also wonder if SBO is playing a role in her confusion.  SBO (small bowel obstruction)  Conservative management with IV fluids, n.p.o., antiemetics and analgesia. Patient is also on Reglan 2.5 mg IV every 12 hours. Bowel regimen is with Colace, MiraLAX and enema.  Abd film 4/8 with worsening SBO. Per daughter was nauseous today. Will place NG tube. Surgery following. Hope for conservative resolution. Recheck abd film in am.  Diabetes mellitus type 2, uncontrolled  We will hold off on by mouth medications until small bowel obstruction resolves. Until then we will use a sliding scale insulin. CBG's in past 24 hours: 137, 180, 144  Atrial fibrillation  Rate controlled with Cardizem. Coumadin dosing per pharmacy.  History of  pulmonary embolism  Coumadin dosing per pharmacy.  HTN (hypertension)  Patient is currently on Cardizem 120 mg twice a day as well as losartan 100 mg daily.  Leukocytosis, unspecified  Secondary to UTI/SBO  Anemia of chronic disease  Secondary to history of CKD. Hemoglobin is stable at 9.4. No current indications for transfusion.  Chronic kidney disease (CKD), stage IV (severe)  Creatinine 2.11 on 08/17/2013 and on this admission 1.9. Creatinine is 1.84 this morning. Continue low rate IV fluids.  Code Status: Full  Family Communication: Daughter at the bedside updated on plan of care Disposition Plan: Remains inpatient   Krystal Isaiah Blakes, MD  [facility] Pager [phone]  If 7PM-7AM, please contact night-coverage [email or web address] Password [email or web address] 08/24/2013, 4:07 PM   LOS: 2 days   Consultants:  Surgery  Procedures:  LE bilateral venous doppler - no evidence of DVT (08/22/2013)  Antibiotics:  Zosyn 08/22/2013 -->  Doxycycline 08/22/2013 -->   HPI/Subjective: Agitated earlier today; now lethargic and sleepy; difficult to arouse.  Objective: Filed Vitals:   08/23/13 1400 08/23/13 1813 08/23/13 2230 08/24/13 0523  BP: 133/63  105/68 152/89  Pulse: 90  71 96  Temp: 98.6 F (37 C)  98.6 F (37 C) 97.7 F (36.5 C)  TempSrc: Oral  Oral Oral  Resp: 18  16 18   Height:      Weight:      SpO2: 96% 98%  97%    Intake/Output Summary (Last 24 hours) at 08/24/13 1607 Last data filed at 08/24/13 1400  Gross per 24 hour  Intake   1150 ml  Output   2525 ml  Net  -1375 ml    Exam:   General:  Pt is sleeping this am  Cardiovascular: Irregular rhythm, S1/S2 appreciated  Respiratory: Clear to auscultation bilaterally, no wheezing  Abdomen: Distended, nontender, bowel sounds present, no guarding  Extremities: Lower extremity edema and erythema appreciated, pulses DP and PT palpable bilaterally  Neuro: Grossly nonfocal  Data Reviewed: Basic Metabolic  Panel:  Recent Labs Lab 08/18/13 0820 08/22/13 0630 08/22/13 1125 08/23/13 0417  NA 137 136* 135* 135*  K 4.0 4.2 3.9 4.0  CL 100 94* 95* 96  CO2 28 28 26 25   GLUCOSE 137* 164* 130* 153*  BUN 31* 28* 28* 30*  CREATININE 1.68* 1.94* 1.86* 1.84*  CALCIUM 8.1* 8.7 8.2* 8.3*  MG 2.8*  --  2.2  --   PHOS  --   --  3.6  --    Liver Function Tests:  Recent Labs Lab 08/22/13 1125 08/23/13 0417  AST 22 22  ALT 13 13  ALKPHOS 96 99  BILITOT 0.3 0.4  PROT 6.9 6.6  ALBUMIN 2.9* 2.9*   No results found for this basename: LIPASE, AMYLASE,  in the last 168 hours No results found for this basename: AMMONIA,  in the last 168 hours CBC:  Recent Labs Lab 08/18/13 0820 08/22/13 0630 08/22/13 1125 08/23/13 0417  WBC 11.0* 10.9* 11.7* 11.9*  NEUTROABS 8.3* 7.5 7.1  --   HGB 8.6* 10.2* 9.3* 9.4*  HCT 26.9* 31.6* 28.5* 28.4*  MCV 83.8 81.7 81.7 81.8  PLT 267 385 369 358   Cardiac Enzymes: No results found for this basename: CKTOTAL, CKMB, CKMBINDEX, TROPONINI,  in the last 168 hours BNP: No components found with this basename: POCBNP,  CBG:  Recent Labs Lab 08/23/13 1213 08/23/13 1637 08/23/13 2312 08/24/13 0805 08/24/13 1215  GLUCAP 119* 113* 110* 135* 87    Recent Results (from the past 240 hour(s))  URINE CULTURE     Status: None   Collection Time    08/15/13 12:32 PM      Result Value Ref Range Status   Specimen Description URINE, SUPRAPUBIC   Final   Special Requests Normal   Final   Culture  Setup Time     Final   Value: 08/15/2013 17:46     Performed at [facility] Count     Final   Value: >=100,000 COLONIES/ML     Performed at [facility]   Culture     Final   Value: SERRATIA MARCESCENS     Performed at [facility]   Report Status 08/17/2013 FINAL   Final   Organism ID, Bacteria SERRATIA MARCESCENS   Final  URINE CULTURE     Status: None   Collection Time    08/15/13 12:32 PM      Result Value Ref Range Status    Specimen Description URINE, CATHETERIZED   Final   Special Requests NONE   Final   Culture  Setup Time     Final   Value: 08/15/2013 21:39     Performed at [facility] Count     Final   Value: >=100,000 COLONIES/ML     Performed at [facility]   Culture     Final   Value: SERRATIA MARCESCENS     Performed at [facility]   Report Status 08/18/2013 FINAL   Final   Organism ID, Bacteria SERRATIA MARCESCENS  Final     Studies: Ct Abdomen Pelvis Wo Contrast 08/22/2013    IMPRESSION: Slight prominence of abdominal and pelvic small bowel loops with decompressed distal small bowel. Findings may reflect early or low grade partial small bowel obstruction.  Decreasing rectum sigmoid stool burden.  Colonic diverticulosis.  Suprapubic catheter remains in the bladder which is decompressed.   Electronically Signed   By: Charlett NoseKevin  Dover M.D.   On: 08/22/2013 09:05   Dg Abd Acute W/chest 08/22/2013    IMPRESSION: 1. New distention of small bowel loops to 4.2 cm in maximal diameter, raising concern for some degree of small bowel obstruction. Alternately, this could reflect mild ileus. No free intra-abdominal air seen. 2. Mild vascular congestion noted; lungs remain grossly clear.   Electronically Signed   By: Roanna RaiderJeffery  Chang M.D.   On: 08/22/2013 06:37    Scheduled Meds: . diltiazem  120 mg Oral BID  . docusate sodium  100 mg Oral BID  . doxycycline   100 mg Intravenous Q12H  . insulin aspart  0-15 Units Subcutaneous TID WC  . insulin aspart  0-5 Units Subcutaneous QHS  . lidocaine  1 patch Transdermal Q24H  . methimazole  5 mg Oral Daily  . metoCLOPramide   2.5 mg Intravenous Q12H  . mometasone-formoterol  2 puff Inhalation BID  . piperacillin-tazobactam   3.375 g Intravenous 3 times per day  . polyethylene glycol  17 g Oral BID  . Warfarin    Does not apply q1800   Continuous Infusions: . sodium chloride 50 mL/hr at 08/23/13 2242

## 2013-08-24 NOTE — Progress Notes (Signed)
Subjective: Pt very agitated, combative, and confused last night.  Had to be restrained.  Niece at bedside says she had a rough night.  Cried out in pain, but unable to tell me what hurts.  No apparent nausea/vomiting.  Had a BM 2 days ago none since.  Unable to tell me if having gas.  Abdomen distended.   Objective: Vital signs in last 24 hours: Temp:  [97.7 F (36.5 C)-98.6 F (37 C)] 97.7 F (36.5 C) (04/08 0523) Pulse Rate:  [71-96] 96 (04/08 0523) Resp:  [16-18] 18 (04/08 0523) BP: (105-152)/(63-89) 152/89 mmHg (04/08 0523) SpO2:  [96 %-98 %] 97 % (04/08 0523) Last BM Date: 08/22/13  Intake/Output from previous day: 04/07 0701 - 04/08 0700 In: 612.5 [I.V.:612.5] Out: 2675 [Urine:2675] Intake/Output this shift:    PE: Gen:  Alert with lots of prodding, very HOH, follows some commands, unable to say how she's feeling, NAD, sleeping upon entry to room Abd: Soft, still distended, not noticeably tender to palpation, +BS, no HSM, abdominal scars noted Ext:  B/L LE edema  Lab Results:   Recent Labs  08/22/13 1125 08/23/13 0417  WBC 11.7* 11.9*  HGB 9.3* 9.4*  HCT 28.5* 28.4*  PLT 369 358   BMET  Recent Labs  08/22/13 1125 08/23/13 0417  NA 135* 135*  K 3.9 4.0  CL 95* 96  CO2 26 25  GLUCOSE 130* 153*  BUN 28* 30*  CREATININE 1.86* 1.84*  CALCIUM 8.2* 8.3*   PT/INR  Recent Labs  08/23/13 0417 08/24/13 0425  LABPROT 30.5* 27.7*  INR 3.06* 2.69*   CMP     Component Value Date/Time   NA 135* 08/23/2013 0417   K 4.0 08/23/2013 0417   CL 96 08/23/2013 0417   CO2 25 08/23/2013 0417   GLUCOSE 153* 08/23/2013 0417   BUN 30* 08/23/2013 0417   CREATININE 1.84* 08/23/2013 0417   CALCIUM 8.3* 08/23/2013 0417   PROT 6.6 08/23/2013 0417   ALBUMIN 2.9* 08/23/2013 0417   AST 22 08/23/2013 0417   ALT 13 08/23/2013 0417   ALKPHOS 99 08/23/2013 0417   BILITOT 0.4 08/23/2013 0417   GFRNONAA 24* 08/23/2013 0417   GFRAA 27* 08/23/2013 0417   Lipase     Component Value Date/Time    LIPASE 18 08/15/2013 1205       Studies/Results: Ct Abdomen Pelvis Wo Contrast  08/22/2013   CLINICAL DATA:  Abdominal pain, back pain. Will a small bowel obstruction.  EXAM: CT ABDOMEN AND PELVIS WITHOUT CONTRAST  TECHNIQUE: Multidetector CT imaging of the abdomen and pelvis was performed following the standard protocol without intravenous contrast.  COMPARISON:  DG ABD ACUTE W/CHEST dated 08/22/2013; CT ABD/PELV WO CM dated 08/15/2013  FINDINGS: Heart is normal size. Linear areas of atelectasis in the lung bases. No effusions.  Prior cholecystectomy. Liver, spleen, right adrenal gland have an unremarkable unenhanced appearance. Mild fatty replacement of the pancreas without focal abnormality. Small nodule in the left adrenal body measuring 13 mm and with a density of -12 Hounsfield units, compatible with adenoma. Mild cortical thinning within the kidneys without hydronephrosis or visible focal abnormality on this unenhanced study.  There are slightly dilated and fluid-filled small bowel loops in the abdomen and pelvis. Scattered air-fluid levels. The distal small bowel loops are decompressed. Findings suspicious for early or low grade partial small bowel obstruction. The exact transition point or cause is not visualized. No free fluid or free air.  Suprapubic catheter is in place with the  urinary bladder decompressed. Scattered diverticula throughout the colon, most pronounced in the sigmoid colon. No evidence of diverticulitis. No free fluid, free air or adenopathy. Appendix is visualized and is normal.  Aorta and iliac vessels are calcified, non aneurysmal.  No acute bony abnormality. Degenerative changes in the thoracolumbar spine. Mild compression fracture of the L1 superior endplate which is stable since prior CT.  Stool burden in the rectosigmoid colon has decreased since prior study.  IMPRESSION: Slight prominence of abdominal and pelvic small bowel loops with decompressed distal small bowel. Findings  may reflect early or low grade partial small bowel obstruction.  Decreasing rectum sigmoid stool burden.  Colonic diverticulosis.  Suprapubic catheter remains in the bladder which is decompressed.   Electronically Signed   By: Charlett Nose M.D.   On: 08/22/2013 09:05    Anti-infectives: Anti-infectives   Start     Dose/Rate Route Frequency Ordered Stop   08/23/13 1600  piperacillin-tazobactam (ZOSYN) IVPB 3.375 g     3.375 g 12.5 mL/hr over 240 Minutes Intravenous 3 times per day 08/23/13 0638     08/22/13 1800  piperacillin-tazobactam (ZOSYN) IVPB 3.375 g  Status:  Discontinued     3.375 g 12.5 mL/hr over 240 Minutes Intravenous 3 times per day 08/22/13 1743 08/23/13 0638   08/22/13 1700  doxycycline (VIBRAMYCIN) 100 mg in dextrose 5 % 250 mL IVPB     100 mg 125 mL/hr over 120 Minutes Intravenous Every 12 hours 08/22/13 1603         Assessment/Plan Abdominal pain  Abdominal distension  Constipation ? Early SBO   Plan:  1. Had 1 BM on 08/22/13 after enema. Hopefully will resolve conservatively without surgical intervention  2. NPO, IVF, pain control, antiemetics, enemas okay, may need digitalized  3. SCD's, on warfarin 4. No immediate surgical interventions, low threshold for NG tube if she becomes nauseated  5. Recheck KUB this am 6. Ordered dulcolax and enemas to be given     LOS: 2 days    Aris Georgia 08/24/2013, 8:22 AM Pager: [phone]

## 2013-08-24 NOTE — Progress Notes (Signed)
CSW met with pt's daughter's 4/7 to assist with d/c planning. They have chosen [facility] for pt's placement. CSW will continue to follow to assist with d/c planning.  [name] LCSW [phone]

## 2013-08-24 NOTE — Progress Notes (Signed)
ANTICOAGULATION CONSULT NOTE - Follow up  Pharmacy Consult for warfarin Indication: History of PE and DVT; history of atrial fibrillation  Allergies  Allergen Reactions  . Humibid La [Guaifenesin] Shortness Of Breath  . Latex Hives  . Detrol [Tolterodine Tartrate] Hives  . Keflex [Cephalexin]     hives  . Vioxx [Rofecoxib] Hives    Patient Measurements: Height: 4' 11.84" (152 cm) Weight: 228 lb 6.3 oz (103.6 kg) IBW/kg (Calculated) : 45.14  Vital Signs: Temp: 97.7 F (36.5 C) (04/08 0523) Temp src: Oral (04/08 0523) BP: 152/89 mmHg (04/08 0523) Pulse Rate: 96 (04/08 0523)  Labs:  Recent Labs  08/22/13 0630  08/22/13 1125 08/23/13 0417 08/24/13 0425  HGB 10.2*  --  9.3* 9.4*  --   HCT 31.6*  --  28.5* 28.4*  --   PLT 385  --  369 358  --   APTT  --   --  59*  --   --   LABPROT  --   < > 31.6* 30.5* 27.7*  INR  --   < > 3.20* 3.06* 2.69*  CREATININE 1.94*  --  1.86* 1.84*  --   < > = values in this interval not displayed.  Estimated Creatinine Clearance: 23.3 ml/min (by C-G formula based on Cr of 1.84).  Assessment: 80 y/o F on chronic warfarin therapy for hx VTE (also hx AFib) presented to ED with back and abdominal pain.  INR supratherapeutic (3.33) on admission.  Orders received for warfarin with pharmacy dosing assistance. Most recent warfarin maintenance dosage reported as 3mg  daily except 2mg  Tuesdays, Thursdays, and Saturdays; last taken 4/5.  Concomitant methimazole noted. However, with patient on this medication chronically and TSH WNL when last checked (08/15/13), there should be no clinically significant interaction with warfarin therapy.  INR now reduced to within therapeutic range at 2.69, warfarin has been held since admission  CBC ok on 4/7- no new CBC. No bleeding reported/documented.  Patient remains NPO   Broad-spectrum abx likely to increase sensitivity to warfarin.  Goal of Therapy:  INR 2-3   Plan:   Warfarin 2mg  PO x 1  tonight  Follow PT/INR daily while inpatient.  CBC at least q72h while inpatient  Thank you for the consult.  Tomi BambergerJesse Jaklyn Alen, PharmD, BCPS Pager: [phone] Pharmacy: [phone] 08/24/2013 10:15 AM

## 2013-08-25 ENCOUNTER — Inpatient Hospital Stay (HOSPITAL_COMMUNITY): Payer: Medicare Other

## 2013-08-25 DIAGNOSIS — N390 Urinary tract infection, site not specified: Secondary | ICD-10-CM

## 2013-08-25 LAB — CBC
HEMATOCRIT: 27.8 % — AB (ref 36.0–46.0)
Hemoglobin: 8.8 g/dL — ABNORMAL LOW (ref 12.0–15.0)
MCH: 26.4 pg (ref 26.0–34.0)
MCHC: 31.7 g/dL (ref 30.0–36.0)
MCV: 83.5 fL (ref 78.0–100.0)
Platelets: 337 10*3/uL (ref 150–400)
RBC: 3.33 MIL/uL — ABNORMAL LOW (ref 3.87–5.11)
RDW: 14.7 % (ref 11.5–15.5)
WBC: 10.9 10*3/uL — AB (ref 4.0–10.5)

## 2013-08-25 LAB — GLUCOSE, CAPILLARY
GLUCOSE-CAPILLARY: 119 mg/dL — AB (ref 70–99)
Glucose-Capillary: 122 mg/dL — ABNORMAL HIGH (ref 70–99)
Glucose-Capillary: 100 mg/dL — ABNORMAL HIGH (ref 70–99)
Glucose-Capillary: 99 mg/dL (ref 70–99)

## 2013-08-25 LAB — BASIC METABOLIC PANEL
BUN: 20 mg/dL (ref 6–23)
CALCIUM: 8.5 mg/dL (ref 8.4–10.5)
CHLORIDE: 101 meq/L (ref 96–112)
CO2: 29 mEq/L (ref 19–32)
CREATININE: 1.49 mg/dL — AB (ref 0.50–1.10)
GFR calc Af Amer: 35 mL/min — ABNORMAL LOW (ref >90)
GFR calc non Af Amer: 30 mL/min — ABNORMAL LOW (ref >90)
Glucose, Bld: 102 mg/dL — ABNORMAL HIGH (ref 70–99)
Potassium: 3.9 mEq/L (ref 3.7–5.3)
Sodium: 140 mEq/L (ref 137–147)

## 2013-08-25 LAB — PROTIME-INR
INR: 2.21 — AB (ref 0.00–1.49)
PROTHROMBIN TIME: 23.8 s — AB (ref 11.6–15.2)

## 2013-08-25 NOTE — Progress Notes (Signed)
ANTIBIOTIC CONSULT NOTE - FOLLOW UP  Pharmacy Consult for zosyn Indication: UIT  Allergies  Allergen Reactions  . Humibid La [Guaifenesin] Shortness Of Breath  . Latex Hives  . Detrol [Tolterodine Tartrate] Hives  . Keflex [Cephalexin]     hives  . Vioxx [Rofecoxib] Hives    Patient Measurements: Height: 4' 11.84" (152 cm) Weight: 233 lb 11 oz (106 kg) IBW/kg (Calculated) : 45.14 Adjusted Body Weight:     Vital Signs: Temp: 99.2 F (37.3 C) (04/09 0544) Temp src: Axillary (04/09 0544) BP: 144/66 mmHg (04/09 0544) Pulse Rate: 103 (04/09 0544) Intake/Output from previous day: 04/08 0701 - 04/09 0700 In: 2050 [I.V.:1800; IV Piggyback:250] Out: 2250 [Urine:2050; Emesis/NG output:200] Intake/Output from this shift: Total I/O In: -  Out: 400 [Urine:400]  Labs:  Recent Labs  08/23/13 0417 08/25/13 0445  WBC 11.9* 10.9*  HGB 9.4* 8.8*  PLT 358 337  CREATININE 1.84* 1.49*   Estimated Creatinine Clearance: 29.2 ml/min (by C-G formula based on Cr of 1.49). No results found for this basename: Rolm GalaVANCOTROUGH, VANCOPEAK, VANCORANDOM, GENTTROUGH, GENTPEAK, GENTRANDOM, TOBRATROUGH, TOBRAPEAK, TOBRARND, AMIKACINPEAK, AMIKACINTROU, AMIKACIN,  in the last 72 hours   Microbiology: Recent Results (from the past 720 hour(s))  URINE CULTURE     Status: None   Collection Time    08/15/13 12:32 PM      Result Value Ref Range Status   Specimen Description URINE, SUPRAPUBIC   Final   Special Requests Normal   Final   Culture  Setup Time     Final   Value: 08/15/2013 17:46     [facility]formed at [facility]   Colony Count     Final   Value: >=100,000 COLONIES/ML     [facility]formed at [facility]   Culture     Final   Value: SERRATIA MARCESCENS     [facility]formed at [facility]   Report Status 08/17/2013 FINAL   Final   Organism ID, Bacteria SERRATIA MARCESCENS   Final  URINE CULTURE     Status: None   Collection Time    08/15/13 12:32 PM      Result Value Ref Range  Status   Specimen Description URINE, CATHETERIZED   Final   Special Requests NONE   Final   Culture  Setup Time     Final   Value: 08/15/2013 21:39     [facility]formed at [facility]   Colony Count     Final   Value: >=100,000 COLONIES/ML     Performed at [facility]   Culture     Final   Value: SERRATIA MARCESCENS     [facility]formed at [facility]   Report Status 08/18/2013 FINAL   Final   Organism ID, Bacteria SERRATIA MARCESCENS   Final    Anti-infectives   Start     Dose/Rate Route Frequency Ordered Stop   08/23/13 1600  piperacillin-tazobactam (ZOSYN) IVPB 3.375 g     3.375 g 12.5 mL/hr over 240 Minutes Intravenous 3 times per day 08/23/13 0638     08/22/13 1800  piperacillin-tazobactam (ZOSYN) IVPB 3.375 g  Status:  Discontinued     3.375 g 12.5 mL/hr over 240 Minutes Intravenous 3 times per day 08/22/13 1743 08/23/13 0638   08/22/13 1700  doxycycline (VIBRAMYCIN) 100 mg in dextrose 5 % 250 mL IVPB     100 mg 125 mL/hr over 120 Minutes Intravenous Every 12 hours 08/22/13 1603        Assessment:  80 yo F recently discharged on 4/2 after being hospitalized for serratia UTI. Noted to have several serratia UTIs in Epic. She was instructed to start Ciprofloxacin for 10 days after discharge, her last dose prior to admission reported as 4/5. She presents today, 08/22/2013 with worsening mental status changed in past 24 hours. MD started Doxycycline for RLE cellulitis. Pharmacy consulted to start Zosyn for broad spectrum UTI coverage.   4/6 >> Doxycycline (MD) >>  4/6 >> Zosyn >>   Tmax: remains afebrile WBC: 10.9 Renal: CKD stage IV, SCr fairly stable at 1.49 on 4/7 (baseline ~1.5?), 29CG, 30N, UOP good  Hx Serratia UTI (08/15/13, 03/01/13, 12/30/12) - Started on Cipro as outpatient on 4/3 No cultures this admission  Goal of Therapy:  Dose appropriately for renal function  Plan:   Continue zosyn 3.375gm IV q8h over 4h infusion - appropriate for renal  function.  Serratia in 3/30 not tested against zosyn (Organism was R to cefazolin). Patient remains confused.  Pharmacy to follow at distance and write note if dose change needed.   Juliette Alcide, PharmD, BCPS.   Pager: [phone]  08/25/2013,11:32 AM

## 2013-08-25 NOTE — Progress Notes (Signed)
ANTICOAGULATION CONSULT NOTE - Follow up  Pharmacy Consult for warfarin Indication: History of PE and DVT; history of atrial fibrillation  Allergies  Allergen Reactions  . Humibid La [Guaifenesin] Shortness Of Breath  . Latex Hives  . Detrol [Tolterodine Tartrate] Hives  . Keflex [Cephalexin]     hives  . Vioxx [Rofecoxib] Hives    Patient Measurements: Height: 4' 11.84" (152 cm) Weight: 233 lb 11 oz (106 kg) IBW/kg (Calculated) : 45.14  Vital Signs: Temp: 99.2 F (37.3 C) (04/09 0544) Temp src: Axillary (04/09 0544) BP: 144/66 mmHg (04/09 0544) Pulse Rate: 103 (04/09 0544)  Labs:  Recent Labs  08/23/13 0417 08/24/13 0425 08/25/13 0445  HGB 9.4*  --  8.8*  HCT 28.4*  --  27.8*  PLT 358  --  337  LABPROT 30.5* 27.7* 23.8*  INR 3.06* 2.69* 2.21*  CREATININE 1.84*  --  1.49*    Estimated Creatinine Clearance: 29.2 ml/min (by C-G formula based on Cr of 1.49).  Assessment: 80 y/o F on chronic warfarin therapy for hx VTE (also hx AFib) presented to ED with back and abdominal pain.  INR supratherapeutic (3.33) on admission.  Orders received for warfarin with pharmacy dosing assistance. Most recent warfarin maintenance dosage reported as 3mg  daily except 2mg  Tuesdays, Thursdays, and Saturdays; last taken 4/5.  Concomitant methimazole noted. However, with patient on this medication chronically and TSH WNL when last checked (08/15/13), there should be no clinically significant interaction with warfarin therapy.  INR today = 2.21 (trending down but therapeutic), doses held or d/c'd last two nights  CBC: Hgb = 8.8, pltc = WNL. No bleeding reported/documented.  Patient remains NPO with distended abdomen - unknown cause, ? SBO with NGT to suction  Broad-spectrum abx likely to increase sensitivity to warfarin.  Goal of Therapy:  INR 2-3   Plan:   Discuss with Dr. Ardyth HarpsHernandez re: plan for warfarin therapy - does not want warfarin to be dosed at this time and no plans  currently to bridge with IV/SQ anticoagulant.  Family investigating pursuing Hospice care.    Continue daily INR at this time  Remove coumadin per pharmacy protocol   Juliette Alcideustin Tiziana Cislo, PharmD, BCPS.   Pager: [phone] 08/25/2013 11:28 AM

## 2013-08-25 NOTE — Progress Notes (Signed)
Subjective: Pt confused, will awaken, but will not answer questions or follow commands.  Still very distended, was nauseated yesterday so an NG was placed.  Only about 200mL out so far.  No BM since 08/22/13.  Had suppository yesterday morning, but was unable to get her to cooperate to another one or enema.    Objective: Vital signs in last 24 hours: Temp:  [98.6 F (37 C)-99.2 F (37.3 C)] 99.2 F (37.3 C) (04/09 0544) Pulse Rate:  [103-108] 103 (04/09 0544) Resp:  [20] 20 (04/09 0544) BP: (126-144)/(66-75) 144/66 mmHg (04/09 0544) SpO2:  [91 %-93 %] 93 % (04/09 0749) Weight:  [233 lb 11 oz (106 kg)] 233 lb 11 oz (106 kg) (04/09 0500) Last BM Date: 08/22/13  Intake/Output from previous day: 04/08 0701 - 04/09 0700 In: 2050 [I.V.:1800; IV Piggyback:250] Out: 2250 [Urine:2050; Emesis/NG output:200] Intake/Output this shift: Total I/O In: -  Out: 400 [Urine:400]   PE: Gen: Alert with lots of prodding, very HOH, follows no command for me this am, unable to say how she's feeling, NAD, sleeping upon entry to room  Abd: Soft, still very distended, not noticeably tender to palpation, +BS, no HSM, abdominal scars noted  Ext: B/L LE edema    Lab Results:   Recent Labs  08/23/13 0417 08/25/13 0445  WBC 11.9* 10.9*  HGB 9.4* 8.8*  HCT 28.4* 27.8*  PLT 358 337   BMET  Recent Labs  08/23/13 0417 08/25/13 0445  NA 135* 140  K 4.0 3.9  CL 96 101  CO2 25 29  GLUCOSE 153* 102*  BUN 30* 20  CREATININE 1.84* 1.49*  CALCIUM 8.3* 8.5   PT/INR  Recent Labs  08/24/13 0425 08/25/13 0445  LABPROT 27.7* 23.8*  INR 2.69* 2.21*   CMP     Component Value Date/Time   NA 140 08/25/2013 0445   K 3.9 08/25/2013 0445   CL 101 08/25/2013 0445   CO2 29 08/25/2013 0445   GLUCOSE 102* 08/25/2013 0445   BUN 20 08/25/2013 0445   CREATININE 1.49* 08/25/2013 0445   CALCIUM 8.5 08/25/2013 0445   PROT 6.6 08/23/2013 0417   ALBUMIN 2.9* 08/23/2013 0417   AST 22 08/23/2013 0417   ALT 13 08/23/2013 0417    ALKPHOS 99 08/23/2013 0417   BILITOT 0.4 08/23/2013 0417   GFRNONAA 30* 08/25/2013 0445   GFRAA 35* 08/25/2013 0445   Lipase     Component Value Date/Time   LIPASE 18 08/15/2013 1205       Studies/Results: Dg Abd 2 Views  08/25/2013   CLINICAL DATA:  Small bowel obstruction, followup, persistent abdominal distention  EXAM: ABDOMEN - 2 VIEW  COMPARISON:  Abdomen films of 08/24/2013  FINDINGS: There is persistent gaseous distention of small bowel loops consistent with a partial small bowel obstruction. Some contrast is scattered throughout the nondistended colon. No free air is seen. An NG tube is present with the tip in the region of the distal antrum of the stomach.  IMPRESSION: Persistently dilated loops of small bowel consistent with partial small bowel obstruction. No free air.   Electronically Signed   By: Dwyane DeePaul  Barry M.D.   On: 08/25/2013 08:59   Dg Abd 2 Views  08/24/2013   CLINICAL DATA:  Follow-up small bowel obstruction. Abdominal distention.  EXAM: ABDOMEN - 2 VIEW  COMPARISON:  CT ABD/PELV WO CM dated 08/22/2013; DG ABD ACUTE W/CHEST dated 08/22/2013  FINDINGS: Multiple gas-filled, dilated loops of small bowel are present in the central  abdomen. The loops measure up to approximately 4 cm in diameter, and the degree of distension may have increased slightly since the prior CT. Oral contrast material is present in the colon. A few small air-fluid levels are noted. No intraperitoneal free air is identified. A suprapubic catheter is noted. Degenerative changes are noted in the lumbar spine and both hip joints.  IMPRESSION: Persistent small bowel dilatation, concerning for partial obstruction and stable to slightly worsened compared to prior CT.   Electronically Signed   By: Sebastian Ache   On: 08/24/2013 15:01    Anti-infectives: Anti-infectives   Start     Dose/Rate Route Frequency Ordered Stop   08/23/13 1600  piperacillin-tazobactam (ZOSYN) IVPB 3.375 g     3.375 g 12.5 mL/hr over 240 Minutes  Intravenous 3 times per day 08/23/13 0638     08/22/13 1800  piperacillin-tazobactam (ZOSYN) IVPB 3.375 g  Status:  Discontinued     3.375 g 12.5 mL/hr over 240 Minutes Intravenous 3 times per day 08/22/13 1743 08/23/13 0638   08/22/13 1700  doxycycline (VIBRAMYCIN) 100 mg in dextrose 5 % 250 mL IVPB     100 mg 125 mL/hr over 120 Minutes Intravenous Every 12 hours 08/22/13 1603         Assessment/Plan Abdominal pain  Abdominal distension  Constipation ? Early SBO   Plan:  1. Had 1 BM on 08/22/13 after enema. No BM since. 2. NPO, IVF, pain control, antiemetics, enemas/dulcolax ok, may need digitalized  3. SCD's, on warfarin  4. May need to have palliative come to see the patient.  I have discussed with the niece, daughter, and sister about the risks if her abdominal distention/pSBO does not resolve.  She would be at risk for perforation or ischemic bowel.  Her KUB showed some worsening dilatation of the small bowel, but contrast was seen in the cecum.  Unsure if she will resolve conservatively.  Do not believe surgery is in her best interest as it may do more harm than good.  The family is happy to have a palliative care discussion. 5.  Sister expresses wanting to make her a DNR, but would like to talk to her daughters first. 90.  May not have to keep NG if the family doesn't want it - it doesn't seem to be working well at this point.  Does not cooperate for suppositories/enemas.    LOS: 3 days    Aris Georgia 08/25/2013, 10:33 AM Pager: [phone]

## 2013-08-25 NOTE — Progress Notes (Signed)
[facility] PROGRESS NOTE  Krystal [name] ZOX:[id number] DOB: 16-Apr-1925 DOA: 08/22/2013 PCP: Mickie Hillier, MD  Brief narrative: 80 year old female with past medical history of hypertension, atrial fibrillation, DVT and PE on coumadin, diabetes, recent hospitalization for UTI due to serratia (was supposed to be on cipro for 10 days after discharge 4/2) who presented to [facility] ED 08/22/2013 with worsening mental status changed in past 24 hours prior to this admission. Pt is not a good historian as she is lethargic at this time. She was given morphine for pain in ED. Patient was apparently (per her daughters) hallucinating and thinking someone has robbed her house and then she did not know where she was and could not recognize her daughters.  In ED, vitals are stable. Blood work revealed WBC count 10.9, hemoglobin 10.2, creatinine 1.94. Abdominal x ray showed new distention of small bowel loops possible small bowel obstruction. This was also seen on CT abdomen.  Repeat abd film 4/8, with worsened SBO. Will place NG tube. Persistent dilated small bowel loops 4/9 despite NG suction.  Assessment and Plan:   Acute encephalopathy  Possible progressive dementia but also UTI. Cx from 3/30 with serratia.  Also wonder if SBO is playing a role in her confusion. Discontinue all antibiotics at this point.  SBO (small bowel obstruction)  Conservative management with IV fluids, n.p.o., antiemetics and analgesia. Patient is also on Reglan 2.5 mg IV every 12 hours. Bowel regimen is with Colace, MiraLAX and enema.  Abd film 4/8 with worsening SBO. Per daughter was nauseous today. Will place NG tube. Surgery following. Persistent SBO on repeated films. High risk for surgery.  Diabetes mellitus type 2, uncontrolled  We will hold off on by mouth medications until small bowel obstruction resolves. Until then we will use a sliding scale insulin. Well controlled at present.  Atrial fibrillation  Rate controlled. Will  DC coumadin at this time given NG to suction and possibility of surgery vs palliative care.  History of pulmonary embolism  Off coumadin.  HTN (hypertension)  Holding all medications given NG to suction.  Leukocytosis, unspecified  Secondary to UTI/SBO  Anemia of chronic disease  Secondary to history of CKD. Hemoglobin is stable at 9.4. No current indications for transfusion.  Chronic kidney disease (CKD), stage IV (severe)  Creatinine 2.11 on 08/17/2013 and on this admission 1.9. Creatinine is 1.49 as of 4/9. Continue low rate IV fluids.  Code Status: Full  Family Communication: Family meeting with 2 daughters and sister. Discussed progression of SBO despite conservative measures. They understand she is very high risk for surgery, and at the same time they understand that if SBO progresses without surgery, she could have an abdominal perforation or mesenteric ischemia. They would like to wait 24-48 hours to see if any improvement before making decision on proceeding with surgery (despite risks) vs palliation. Disposition Plan: Remains inpatient   Henderson Cloud, MD  [facility] Pager [phone]  If 7PM-7AM, please contact night-coverage [email or web address] Password [email or web address] 08/25/2013, 5:59 PM   LOS: 3 days   Consultants:  Surgery  Procedures:  LE bilateral venous doppler - no evidence of DVT (08/22/2013)  Antibiotics:  Zosyn 08/22/2013 -->  Doxycycline 08/22/2013 -->   HPI/Subjective:  now lethargic and sleepy; difficult to arouse.  Objective: Filed Vitals:   08/25/13 0500 08/25/13 0544 08/25/13 0749 08/25/13 1400  BP:  144/66  131/74  Pulse:  103  90  Temp:  99.2 F (37.3 C)  98.4 F (36.9  C)  TempSrc:  Axillary  Oral  Resp:  20  18  Height:      Weight: 106 kg (233 lb 11 oz)     SpO2:  91% 93% 92%    Intake/Output Summary (Last 24 hours) at 08/25/13 1759 Last data filed at 08/25/13 1400  Gross per 24 hour  Intake   1350 ml  Output   1950 ml   Net   -600 ml    Exam:   General:  Pt is drowsy, lethargic.  Cardiovascular: Irregular rhythm, S1/S2 appreciated  Respiratory: Clear to auscultation bilaterally, no wheezing  Abdomen: Distended, nontender, bowel sounds present, no guarding  Extremities: Lower extremity edema and erythema appreciated, pulses DP and PT palpable bilaterally  Neuro: Unresponsive  Data Reviewed: Basic Metabolic Panel:  Recent Labs Lab 08/22/13 0630 08/22/13 1125 08/23/13 0417 08/25/13 0445  NA 136* 135* 135* 140  K 4.2 3.9 4.0 3.9  CL 94* 95* 96 101  CO2 28 26 25 29   GLUCOSE 164* 130* 153* 102*  BUN 28* 28* 30* 20  CREATININE 1.94* 1.86* 1.84* 1.49*  CALCIUM 8.7 8.2* 8.3* 8.5  MG  --  2.2  --   --   PHOS  --  3.6  --   --    Liver Function Tests:  Recent Labs Lab 08/22/13 1125 08/23/13 0417  AST 22 22  ALT 13 13  ALKPHOS 96 99  BILITOT 0.3 0.4  PROT 6.9 6.6  ALBUMIN 2.9* 2.9*   No results found for this basename: LIPASE, AMYLASE,  in the last 168 hours No results found for this basename: AMMONIA,  in the last 168 hours CBC:  Recent Labs Lab 08/22/13 0630 08/22/13 1125 08/23/13 0417 08/25/13 0445  WBC 10.9* 11.7* 11.9* 10.9*  NEUTROABS 7.5 7.1  --   --   HGB 10.2* 9.3* 9.4* 8.8*  HCT 31.6* 28.5* 28.4* 27.8*  MCV 81.7 81.7 81.8 83.5  PLT 385 369 358 337   Cardiac Enzymes: No results found for this basename: CKTOTAL, CKMB, CKMBINDEX, TROPONINI,  in the last 168 hours BNP: No components found with this basename: POCBNP,  CBG:  Recent Labs Lab 08/24/13 1648 08/24/13 2112 08/25/13 0746 08/25/13 1204 08/25/13 1633  GLUCAP 106* 133* 122* 100* 99    No results found for this or any previous visit (from the past 240 hour(s)).   Studies: Ct Abdomen Pelvis Wo Contrast 08/22/2013    IMPRESSION: Slight prominence of abdominal and pelvic small bowel loops with decompressed distal small bowel. Findings may reflect early or low grade partial small bowel obstruction.   Decreasing rectum sigmoid stool burden.  Colonic diverticulosis.  Suprapubic catheter remains in the bladder which is decompressed.   Electronically Signed   By: Charlett Nose M.D.   On: 08/22/2013 09:05   Dg Abd Acute W/chest 08/22/2013    IMPRESSION: 1. New distention of small bowel loops to 4.2 cm in maximal diameter, raising concern for some degree of small bowel obstruction. Alternately, this could reflect mild ileus. No free intra-abdominal air seen. 2. Mild vascular congestion noted; lungs remain grossly clear.   Electronically Signed   By: Roanna Raider M.D.   On: 08/22/2013 06:37    Scheduled Meds: . diltiazem  120 mg Oral BID  . docusate sodium  100 mg Oral BID  . doxycycline   100 mg Intravenous Q12H  . insulin aspart  0-15 Units Subcutaneous TID WC  . insulin aspart  0-5 Units Subcutaneous QHS  .  lidocaine  1 patch Transdermal Q24H  . methimazole  5 mg Oral Daily  . metoCLOPramide   2.5 mg Intravenous Q12H  . mometasone-formoterol  2 puff Inhalation BID  . piperacillin-tazobactam   3.375 g Intravenous 3 times per day  . polyethylene glycol  17 g Oral BID  . Warfarin    Does not apply q1800   Continuous Infusions: . sodium chloride 50 mL/hr at 08/25/13 [phone]

## 2013-08-26 ENCOUNTER — Inpatient Hospital Stay (HOSPITAL_COMMUNITY): Payer: Medicare Other

## 2013-08-26 LAB — BASIC METABOLIC PANEL
BUN: 17 mg/dL (ref 6–23)
CALCIUM: 8.2 mg/dL — AB (ref 8.4–10.5)
CHLORIDE: 100 meq/L (ref 96–112)
CO2: 28 mEq/L (ref 19–32)
Creatinine, Ser: 1.35 mg/dL — ABNORMAL HIGH (ref 0.50–1.10)
GFR calc Af Amer: 40 mL/min — ABNORMAL LOW (ref >90)
GFR calc non Af Amer: 34 mL/min — ABNORMAL LOW (ref >90)
Glucose, Bld: 102 mg/dL — ABNORMAL HIGH (ref 70–99)
Potassium: 3.8 mEq/L (ref 3.7–5.3)
SODIUM: 139 meq/L (ref 137–147)

## 2013-08-26 LAB — PROTIME-INR
INR: 1.85 — AB (ref 0.00–1.49)
Prothrombin Time: 20.8 seconds — ABNORMAL HIGH (ref 11.6–15.2)

## 2013-08-26 LAB — GLUCOSE, CAPILLARY
GLUCOSE-CAPILLARY: 142 mg/dL — AB (ref 70–99)
GLUCOSE-CAPILLARY: 97 mg/dL (ref 70–99)
GLUCOSE-CAPILLARY: 92 mg/dL (ref 70–99)
Glucose-Capillary: 98 mg/dL (ref 70–99)

## 2013-08-26 NOTE — Progress Notes (Signed)
Telephone order from MD Ardyth HarpsHernandez that behavioral restraints order renewed, order entered Stanford BreedKennitrish N Amethyst Gainer RN 08-26-2013

## 2013-08-26 NOTE — Progress Notes (Signed)
Subjective: Pt doing much better today, much more alert, no N/V, abdominal pain less.  Not as distended.  Sister at bedside.  Family have decided to continue with conservative mgt for now.  NG output 1366mL/24 hours started yesterday afternoon.  Objective: Vital signs in last 24 hours: Temp:  [97.8 F (36.6 C)-99.4 F (37.4 C)] 99.4 F (37.4 C) (04/10 0627) Pulse Rate:  [79-90] 83 (04/10 0627) Resp:  [18-20] 20 (04/10 0627) BP: (110-145)/(65-80) 145/80 mmHg (04/10 0627) SpO2:  [90 %-96 %] 96 % (04/10 0832) Last BM Date: 08/25/13  Intake/Output from previous day: 04/09 0701 - 04/10 0700 In: 1650 [I.V.:1200; IV Piggyback:450] Out: 2875 [Urine:1575; Emesis/NG output:1300] Intake/Output this shift:    PE: Gen:  Alert, NAD, pleasant, follows commands today and answers questions, actually recognizes family/friends today Abd: Obese, soft, NT, less distended, +BS, no HSM, no rebound or guarding, NG output green and tan 1352mL/24 hours.     Lab Results:   Recent Labs  08/25/13 0445  WBC 10.9*  HGB 8.8*  HCT 27.8*  PLT 337   BMET  Recent Labs  08/25/13 0445 08/26/13 0443  NA 140 139  K 3.9 3.8  CL 101 100  CO2 29 28  GLUCOSE 102* 102*  BUN 20 17  CREATININE 1.49* 1.35*  CALCIUM 8.5 8.2*   PT/INR  Recent Labs  08/25/13 0445 08/26/13 0443  LABPROT 23.8* 20.8*  INR 2.21* 1.85*   CMP     Component Value Date/Time   NA 139 08/26/2013 0443   K 3.8 08/26/2013 0443   CL 100 08/26/2013 0443   CO2 28 08/26/2013 0443   GLUCOSE 102* 08/26/2013 0443   BUN 17 08/26/2013 0443   CREATININE 1.35* 08/26/2013 0443   CALCIUM 8.2* 08/26/2013 0443   PROT 6.6 08/23/2013 0417   ALBUMIN 2.9* 08/23/2013 0417   AST 22 08/23/2013 0417   ALT 13 08/23/2013 0417   ALKPHOS 99 08/23/2013 0417   BILITOT 0.4 08/23/2013 0417   GFRNONAA 34* 08/26/2013 0443   GFRAA 40* 08/26/2013 0443   Lipase     Component Value Date/Time   LIPASE 18 08/15/2013 1205       Studies/Results: Dg Abd 2  Views  08/26/2013   CLINICAL DATA:  Small bowel obstruction  EXAM: ABDOMEN - 2 VIEW  COMPARISON:  08/25/2013  FINDINGS: NG tube within the stomach. Similar pattern of moderately dilated small bowel in the right abdomen. Contrast remains present in the colon. No free air evident. Minimal interval change.  IMPRESSION: Stable small bowel dilatation compatible with partial SBO. No free air.   Electronically Signed   By: Ruel Favors M.D.   On: 08/26/2013 08:28   Dg Abd 2 Views  08/25/2013   CLINICAL DATA:  Small bowel obstruction, followup, persistent abdominal distention  EXAM: ABDOMEN - 2 VIEW  COMPARISON:  Abdomen films of 08/24/2013  FINDINGS: There is persistent gaseous distention of small bowel loops consistent with a partial small bowel obstruction. Some contrast is scattered throughout the nondistended colon. No free air is seen. An NG tube is present with the tip in the region of the distal antrum of the stomach.  IMPRESSION: Persistently dilated loops of small bowel consistent with partial small bowel obstruction. No free air.   Electronically Signed   By: Dwyane Dee M.D.   On: 08/25/2013 08:59   Dg Abd 2 Views  08/24/2013   CLINICAL DATA:  Follow-up small bowel obstruction. Abdominal distention.  EXAM: ABDOMEN - 2 VIEW  COMPARISON:  CT ABD/PELV WO CM dated 08/22/2013; DG ABD ACUTE W/CHEST dated 08/22/2013  FINDINGS: Multiple gas-filled, dilated loops of small bowel are present in the central abdomen. The loops measure up to approximately 4 cm in diameter, and the degree of distension may have increased slightly since the prior CT. Oral contrast material is present in the colon. A few small air-fluid levels are noted. No intraperitoneal free air is identified. A suprapubic catheter is noted. Degenerative changes are noted in the lumbar spine and both hip joints.  IMPRESSION: Persistent small bowel dilatation, concerning for partial obstruction and stable to slightly worsened compared to prior CT.    Electronically Signed   By: Sebastian AcheAllen  Grady   On: 08/24/2013 15:01    Anti-infectives: Anti-infectives   Start     Dose/Rate Route Frequency Ordered Stop   08/23/13 1600  piperacillin-tazobactam (ZOSYN) IVPB 3.375 g  Status:  Discontinued     3.375 g 12.5 mL/hr over 240 Minutes Intravenous 3 times per day 08/23/13 0638 08/25/13 1807   08/22/13 1800  piperacillin-tazobactam (ZOSYN) IVPB 3.375 g  Status:  Discontinued     3.375 g 12.5 mL/hr over 240 Minutes Intravenous 3 times per day 08/22/13 1743 08/23/13 0638   08/22/13 1700  doxycycline (VIBRAMYCIN) 100 mg in dextrose 5 % 250 mL IVPB     100 mg 125 mL/hr over 120 Minutes Intravenous Every 12 hours 08/22/13 1603         Assessment/Plan Abdominal pain  Abdominal distension  Constipation ? Early pSBO   Plan:  1. Had a good BM today 2. NPO, IVF, pain control, antiemetics, cont enemas/dulcolax 3. SCD's, on warfarin  4. May need to have palliative if not improved. I have discussed with the niece, daughter, and sister about the risks if her abdominal distention/pSBO does not resolve. She would be at risk for perforation or ischemic bowel. Her KUB is about the same today however contrast does make it through to the distal colon today which is encouraging (however, slow).  Her clinical picture is much improved so this is re-assuring. 5. NG tube with higher output today (1300mL) Does not cooperate for suppositories/enemas.  Continue NG for now      LOS: 4 days    Aris GeorgiaMegan Dort 08/26/2013, 9:41 AM Pager: [phone]

## 2013-08-26 NOTE — Progress Notes (Signed)
CSW assisting with d/c planning. PN reviewed. Options are being considered by family. CSW will provide ongoing support and assistance with d/c planning needs.  Cori RazorJamie Dayleen Beske LCSW [phone]

## 2013-08-26 NOTE — Progress Notes (Signed)
[facility] PROGRESS NOTE  Krystal [name] ZOX:[id number] DOB: 1924/07/04 DOA: 08/22/2013 PCP: Mickie HillierLITTLE,Krystal LORNE, MD  Brief narrative: 80 year old female with past medical history of hypertension, atrial fibrillation, DVT and PE on coumadin, diabetes, recent hospitalization for UTI due to serratia (was supposed to be on cipro for 10 days after discharge 4/2) who [facility] 08[facility]22/2013 with worsening mental status changed in past 24 hours prior to this admission. Pt is not a good historian as she is lethargic at this time. She was given morphine for pain in ED. Patient was apparently (per her daughters) hallucinating and thinking someone has robbed her house and then she did not know where she was and could not recognize her daughters.  In ED, vitals are stable. Blood work revealed WBC count 10.9, hemoglobin 10.2, creatinine 1.94. Abdominal x ray showed new distention of small bowel loops possible small bowel obstruction. This was also seen on CT abdomen.  Repeat abd film 4/8, with worsened SBO. Will place NG tube. Persistent dilated small bowel loops 4/9 despite NG suction.  Assessment and Plan:   Acute encephalopathy  Possible progressive dementia but also UTI. Cx from 3/30 with serratia.  Also wonder if SBO is playing a role in her confusion. Discontinue all antibiotics at this point. She is a [name] more alert today than in days prior.  SBO (small bowel obstruction)  Conservative management with IV fluids, n.p.o., antiemetics and analgesia. Patient is also on Reglan 2.5 mg IV every 12 hours. Bowel regimen is with Colace, MiraLAX and enema.  Abd film 4/8 with worsening SBO. Per daughter was nauseous today. Will place NG tube. Surgery following. Persistent SBO on repeated films. High risk for surgery. NG with significant output and had a BM: hopeful for some recovery of bowel function.  Diabetes mellitus type 2, uncontrolled  We will hold off on by mouth medications until small bowel  obstruction resolves. Until then we will use a sliding scale insulin. Well controlled at present.  Atrial fibrillation  Rate controlled. Will DC coumadin at this time given NG to suction and possibility of surgery vs palliative care.  History of pulmonary embolism  Off coumadin.  HTN (hypertension)  Holding all medications given NG to suction.  Leukocytosis, unspecified  Secondary to UTI/SBO  Anemia of chronic disease  Secondary to history of CKD. Hemoglobin is stable at 9.4. No current indications for transfusion.  Chronic kidney disease (CKD), stage IV (severe)  Creatinine 2.11 on 08/17/2013 and on this admission 1.9. Creatinine is 1.49 as of 4/9. Continue low rate IV fluids.  Code Status: Full  Family Communication: Family meeting with 2 daughters and sister. Discussed progression of SBO despite conservative measures. They understand she is very high risk for surgery, and at the same time they understand that if SBO progresses without surgery, she could have an abdominal perforation or mesenteric ischemia. They would like to wait 24-48 hours to see if any improvement before making decision on proceeding with surgery (despite risks) vs palliation. Disposition Plan: Remains inpatient   Henderson CloudEstela Y Hernandez Acosta, MD  [facility] Pager [phone]  If 7PM-7AM, please contact night-coverage [email or web address] Password [email or web address] 08/26/2013, 3:52 PM   LOS: 4 days   Consultants:  Surgery  Procedures:  LE bilateral venous doppler - no evidence of DVT (08/22/2013)  Antibiotics:  Zosyn 08/22/2013 -->  Doxycycline 08/22/2013 -->   HPI/Subjective:  now lethargic and sleepy; difficult to arouse.  Objective: Filed Vitals:   08/25/13 2140 08/26/13 [id number] 08/26/13  1610 08/26/13 1400  BP: 110/65 145/80  166/80  Pulse: 79 83  87  Temp: 97.8 F (36.6 C) 99.4 F (37.4 C)  97.8 F (36.6 C)  TempSrc: Oral Axillary  Oral  Resp: 18 20  18   Height:      Weight:      SpO2: 90% 90% 96%  92%    Intake/Output Summary (Last 24 hours) at 08/26/13 1552 Last data filed at 08/26/13 1400  Gross per 24 hour  Intake   1650 ml  Output   2525 ml  Net   -875 ml    Exam:   General:  Pt is drowsy, lethargic.  Cardiovascular: Irregular rhythm, S1/S2 appreciated  Respiratory: Clear to auscultation bilaterally, no wheezing  Abdomen: Distended, nontender, bowel sounds present, no guarding  Extremities: Lower extremity edema and erythema appreciated, pulses DP and PT palpable bilaterally  Neuro: Unresponsive  Data Reviewed: Basic Metabolic Panel:  Recent Labs Lab 08/22/13 0630 08/22/13 1125 08/23/13 0417 08/25/13 0445 08/26/13 0443  NA 136* 135* 135* 140 139  K 4.2 3.9 4.0 3.9 3.8  CL 94* 95* 96 101 100  CO2 28 26 25 29 28   GLUCOSE 164* 130* 153* 102* 102*  BUN 28* 28* 30* 20 17  CREATININE 1.94* 1.86* 1.84* 1.49* 1.35*  CALCIUM 8.7 8.2* 8.3* 8.5 8.2*  MG  --  2.2  --   --   --   PHOS  --  3.6  --   --   --    Liver Function Tests:  Recent Labs Lab 08/22/13 1125 08/23/13 0417  AST 22 22  ALT 13 13  ALKPHOS 96 99  BILITOT 0.3 0.4  PROT 6.9 6.6  ALBUMIN 2.9* 2.9*   No results found for this basename: LIPASE, AMYLASE,  in the last 168 hours No results found for this basename: AMMONIA,  in the last 168 hours CBC:  Recent Labs Lab 08/22/13 0630 08/22/13 1125 08/23/13 0417 08/25/13 0445  WBC 10.9* 11.7* 11.9* 10.9*  NEUTROABS 7.5 7.1  --   --   HGB 10.2* 9.3* 9.4* 8.8*  HCT 31.6* 28.5* 28.4* 27.8*  MCV 81.7 81.7 81.8 83.5  PLT 385 369 358 337   Cardiac Enzymes: No results found for this basename: CKTOTAL, CKMB, CKMBINDEX, TROPONINI,  in the last 168 hours BNP: No components found with this basename: POCBNP,  CBG:  Recent Labs Lab 08/25/13 1204 08/25/13 1633 08/25/13 2135 08/26/13 0859 08/26/13 1213  GLUCAP 100* 99 119* 142* 98    No results found for this or any previous visit (from the past 240 hour(s)).   Studies: Ct Abdomen  Pelvis Wo Contrast 08/22/2013    IMPRESSION: Slight prominence of abdominal and pelvic small bowel loops with decompressed distal small bowel. Findings may reflect early or low grade partial small bowel obstruction.  Decreasing rectum sigmoid stool burden.  Colonic diverticulosis.  Suprapubic catheter remains in the bladder which is decompressed.   Electronically Signed   By: Charlett Nose M.D.   On: 08/22/2013 09:05   Dg Abd Acute W/chest 08/22/2013    IMPRESSION: 1. New distention of small bowel loops to 4.2 cm in maximal diameter, raising concern for some degree of small bowel obstruction. Alternately, this could reflect mild ileus. No free intra-abdominal air seen. 2. Mild vascular congestion noted; lungs remain grossly clear.   Electronically Signed   By: Roanna Raider M.D.   On: 08/22/2013 06:37    Scheduled Meds: . diltiazem  120 mg Oral  BID  . docusate sodium  100 mg Oral BID  . doxycycline   100 mg Intravenous Q12H  . insulin aspart  0-15 Units Subcutaneous TID WC  . insulin aspart  0-5 Units Subcutaneous QHS  . lidocaine  1 patch Transdermal Q24H  . methimazole  5 mg Oral Daily  . metoCLOPramide   2.5 mg Intravenous Q12H  . mometasone-formoterol  2 puff Inhalation BID  . piperacillin-tazobactam   3.375 g Intravenous 3 times per day  . polyethylene glycol  17 g Oral BID  . Warfarin    Does not apply q1800   Continuous Infusions: . sodium chloride 50 mL/hr at 08/26/13 0600

## 2013-08-27 ENCOUNTER — Inpatient Hospital Stay (HOSPITAL_COMMUNITY): Payer: Medicare Other

## 2013-08-27 LAB — GLUCOSE, CAPILLARY
GLUCOSE-CAPILLARY: 98 mg/dL (ref 70–99)
GLUCOSE-CAPILLARY: 101 mg/dL — AB (ref 70–99)
GLUCOSE-CAPILLARY: 98 mg/dL (ref 70–99)
Glucose-Capillary: 110 mg/dL — ABNORMAL HIGH (ref 70–99)

## 2013-08-27 LAB — PROTIME-INR
INR: 1.74 — AB (ref 0.00–1.49)
PROTHROMBIN TIME: 19.8 s — AB (ref 11.6–15.2)

## 2013-08-27 LAB — BASIC METABOLIC PANEL
BUN: 18 mg/dL (ref 6–23)
CHLORIDE: 100 meq/L (ref 96–112)
CO2: 27 mEq/L (ref 19–32)
Calcium: 8.4 mg/dL (ref 8.4–10.5)
Creatinine, Ser: 1.14 mg/dL — ABNORMAL HIGH (ref 0.50–1.10)
GFR calc non Af Amer: 42 mL/min — ABNORMAL LOW (ref >90)
GFR, EST AFRICAN AMERICAN: 49 mL/min — AB (ref >90)
Glucose, Bld: 100 mg/dL — ABNORMAL HIGH (ref 70–99)
POTASSIUM: 3.7 meq/L (ref 3.7–5.3)
Sodium: 139 mEq/L (ref 137–147)

## 2013-08-27 MED ORDER — PHENOL 1.4 % MT LIQD
1.0000 | OROMUCOSAL | Status: DC | PRN
  Administered 2013-08-27 – 2013-08-29 (×2): 1 via OROMUCOSAL
  Filled 2013-08-27 (×3): qty 177

## 2013-08-27 MED ORDER — BIOTENE DRY MOUTH MT LIQD
15.0000 mL | Freq: Two times a day (BID) | OROMUCOSAL | Status: DC
  Administered 2013-08-28 – 2013-09-01 (×8): 15 mL via OROMUCOSAL

## 2013-08-27 MED ORDER — CHLORHEXIDINE GLUCONATE 0.12 % MT SOLN
15.0000 mL | Freq: Two times a day (BID) | OROMUCOSAL | Status: DC
  Administered 2013-08-28 – 2013-09-01 (×9): 15 mL via OROMUCOSAL
  Filled 2013-08-27 (×12): qty 15

## 2013-08-27 MED ORDER — INSULIN ASPART 100 UNIT/ML ~~LOC~~ SOLN
0.0000 [IU] | SUBCUTANEOUS | Status: DC
  Administered 2013-08-28: 2 [IU] via SUBCUTANEOUS
  Administered 2013-08-28: 3 [IU] via SUBCUTANEOUS
  Administered 2013-08-29: 2 [IU] via SUBCUTANEOUS
  Administered 2013-08-29 – 2013-08-30 (×3): 3 [IU] via SUBCUTANEOUS
  Administered 2013-08-30: 2 [IU] via SUBCUTANEOUS
  Administered 2013-08-30: 5 [IU] via SUBCUTANEOUS
  Administered 2013-08-30: 3 [IU] via SUBCUTANEOUS
  Administered 2013-08-31: 5 [IU] via SUBCUTANEOUS
  Administered 2013-08-31: 2 [IU] via SUBCUTANEOUS
  Administered 2013-08-31: 8 [IU] via SUBCUTANEOUS
  Administered 2013-08-31: 3 [IU] via SUBCUTANEOUS

## 2013-08-27 NOTE — Progress Notes (Signed)
[facility] PROGRESS NOTE  Krystal SmokerLouise P Escalante WUJ:[id number] DOB: 1924-12-09 DOA: 08/22/2013 PCP: Mickie HillierLITTLE,[name], MD  Brief narrative: 80 year old female with past medical history of hypertension, atrial fibrillation, DVT and PE on coumadin, diabetes, recent hospitalization for UTI due to serratia (was supposed to be on cipro for 10 days after discharge 4/2) who [facility] with worsening mental status changed in past 24 hours prior to this admission. Pt is not a good historian as she is lethargic at this time. She was given morphine for pain in ED. Patient was apparently (per her daughters) hallucinating and thinking someone has robbed her house and then she did not know where she was and could not recognize her daughters.  In ED, vitals are stable. Blood work revealed WBC count 10.9, hemoglobin 10.2, creatinine 1.94. Abdominal x ray showed new distention of small bowel loops possible small bowel obstruction. This was also seen on CT abdomen.  Repeat abd film 4/8, with worsened SBO. Will place NG tube. Persistent dilated small bowel loops 4/9 despite NG suction.  Assessment and Plan:   Acute encephalopathy  Possible progressive dementia but also UTI. Cx from 3/30 with serratia.  Also wonder if SBO is playing a role in her confusion. Discontinue all antibiotics at this point. She is a little more alert today than in days prior.  SBO (small bowel obstruction)  Conservative management with IV fluids, n.p.o., antiemetics and analgesia. Patient is also on Reglan 2.5 mg IV every 12 hours. Bowel regimen is with Colace, MiraLAX and enema.  Abd film 4/8 with worsening SBO. Per daughter was nauseous today. Will place NG tube. Surgery following. Persistent SBO on repeated films. High risk for surgery. NG output has slowed down. No BM today. hopeful for some recovery of bowel function. No surgery is contemplated at present.  Diabetes mellitus type 2, uncontrolled  We will hold off on by  mouth medications until small bowel obstruction resolves. Until then we will use a sliding scale insulin. Well controlled at present.  Atrial fibrillation  Rate controlled. Will DC coumadin at this time given NG to suction and possibility of surgery vs palliative care.  History of pulmonary embolism  Off coumadin.  HTN (hypertension)  Holding all medications given NG to suction.  Leukocytosis, unspecified  Secondary to UTI/SBO  Anemia of chronic disease  Secondary to history of CKD. Hemoglobin is stable at 9.4. No current indications for transfusion.  Chronic kidney disease (CKD), stage IV (severe)  Creatinine 2.11 on 08/17/2013 and on this admission 1.9. Creatinine is 1.49 as of 4/9. Continue low rate IV fluids.  Code Status: Full  Family Communication: Family meeting with 2 daughters and sister. Discussed progression of SBO despite conservative measures. They understand she is very high risk for surgery, and at the same time they understand that if SBO progresses without surgery, she could have an abdominal perforation or mesenteric ischemia. They would like to wait 24-48 hours to see if any improvement before making decision on proceeding with surgery (despite risks) vs palliation. Disposition Plan: Remains inpatient   Henderson CloudEstela Y Hernandez Acosta, MD  [facility] Pager [phone]  If 7PM-7AM, please contact night-coverage [email or web address] Password [email or web address] 08/27/2013, 4:40 PM   LOS: 5 days   Consultants:  Surgery  Procedures:  LE bilateral venous doppler - no evidence of DVT (08/22/2013)  Antibiotics:  Zosyn 08/22/2013 -->  Doxycycline 08/22/2013 -->   HPI/Subjective:  More awake today and agitated.  Objective: Filed Vitals:   08/26/13  2139 08/27/13 0628 08/27/13 1148 08/27/13 1527  BP: 138/82 146/82  184/80  Pulse: 92 94  91  Temp: 98.5 F (36.9 C) 98.1 F (36.7 C)  98.2 F (36.8 C)  TempSrc: Oral Oral  Oral  Resp: 16 16  16   Height:      Weight:       SpO2: 92% 92% 97% 94%    Intake/Output Summary (Last 24 hours) at 08/27/13 1640 Last data filed at 08/27/13 1528  Gross per 24 hour  Intake   1200 ml  Output   2600 ml  Net  -1400 ml    Exam:   General:  awake  Cardiovascular: Irregular rhythm, S1/S2 appreciated  Respiratory: Clear to auscultation bilaterally, no wheezing  Abdomen: Distended, nontender, bowel sounds present, no guarding  Extremities: Lower extremity edema and erythema appreciated, pulses DP and PT palpable bilaterally   Data Reviewed: Basic Metabolic Panel:  Recent Labs Lab 08/22/13 1125 08/23/13 0417 08/25/13 0445 08/26/13 0443 08/27/13 0500  NA 135* 135* 140 139 139  K 3.9 4.0 3.9 3.8 3.7  CL 95* 96 101 100 100  CO2 26 25 29 28 27   GLUCOSE 130* 153* 102* 102* 100*  BUN 28* 30* 20 17 18   CREATININE 1.86* 1.84* 1.49* 1.35* 1.14*  CALCIUM 8.2* 8.3* 8.5 8.2* 8.4  MG 2.2  --   --   --   --   PHOS 3.6  --   --   --   --    Liver Function Tests:  Recent Labs Lab 08/22/13 1125 08/23/13 0417  AST 22 22  ALT 13 13  ALKPHOS 96 99  BILITOT 0.3 0.4  PROT 6.9 6.6  ALBUMIN 2.9* 2.9*   No results found for this basename: LIPASE, AMYLASE,  in the last 168 hours No results found for this basename: AMMONIA,  in the last 168 hours CBC:  Recent Labs Lab 08/22/13 0630 08/22/13 1125 08/23/13 0417 08/25/13 0445  WBC 10.9* 11.7* 11.9* 10.9*  NEUTROABS 7.5 7.1  --   --   HGB 10.2* 9.3* 9.4* 8.8*  HCT 31.6* 28.5* 28.4* 27.8*  MCV 81.7 81.7 81.8 83.5  PLT 385 369 358 337   Cardiac Enzymes: No results found for this basename: CKTOTAL, CKMB, CKMBINDEX, TROPONINI,  in the last 168 hours BNP: No components found with this basename: POCBNP,  CBG:  Recent Labs Lab 08/26/13 1712 08/26/13 2121 08/27/13 0847 08/27/13 1244 08/27/13 1632  GLUCAP 97 92 98 110* 101*    No results found for this or any previous visit (from the past 240 hour(s)).   Studies: Ct Abdomen Pelvis Wo  Contrast 08/22/2013    IMPRESSION: Slight prominence of abdominal and pelvic small bowel loops with decompressed distal small bowel. Findings may reflect early or low grade partial small bowel obstruction.  Decreasing rectum sigmoid stool burden.  Colonic diverticulosis.  Suprapubic catheter remains in the bladder which is decompressed.   Electronically Signed   By: Charlett Nose M.D.   On: 08/22/2013 09:05   Dg Abd Acute W/chest 08/22/2013    IMPRESSION: 1. New distention of small bowel loops to 4.2 cm in maximal diameter, raising concern for some degree of small bowel obstruction. Alternately, this could reflect mild ileus. No free intra-abdominal air seen. 2. Mild vascular congestion noted; lungs remain grossly clear.   Electronically Signed   By: Roanna Raider M.D.   On: 08/22/2013 06:37    Scheduled Meds: . diltiazem  120 mg Oral BID  .  docusate sodium  100 mg Oral BID  . doxycycline   100 mg Intravenous Q12H  . insulin aspart  0-15 Units Subcutaneous TID WC  . insulin aspart  0-5 Units Subcutaneous QHS  . lidocaine  1 patch Transdermal Q24H  . methimazole  5 mg Oral Daily  . metoCLOPramide   2.5 mg Intravenous Q12H  . mometasone-formoterol  2 puff Inhalation BID  . piperacillin-tazobactam   3.375 g Intravenous 3 times per day  . polyethylene glycol  17 g Oral BID  . Warfarin    Does not apply q1800   Continuous Infusions: . sodium chloride 50 mL/hr at 08/27/13 0600

## 2013-08-27 NOTE — Progress Notes (Signed)
Subjective: " I need to go to the bathroom"  Objective: Vital signs in last 24 hours: Temp:  [97.8 F (36.6 C)-98.5 F (36.9 C)] 98.1 F (36.7 C) (04/11 0628) Pulse Rate:  [87-94] 94 (04/11 0628) Resp:  [16-18] 16 (04/11 0628) BP: (138-166)/(80-82) 146/82 mmHg (04/11 0628) SpO2:  [92 %] 92 % (04/11 0628) Last BM Date: 08/25/13  Intake/Output from previous day: 04/10 0701 - 04/11 0700 In: 1450 [I.V.:1200; IV Piggyback:250] Out: 2400 [Urine:1500; Emesis/NG output:900] Intake/Output this shift:    PE: General- In NAD Abdomen-soft, active bowel sounds, not distended  Lab Results:   Recent Labs  08/25/13 0445  WBC 10.9*  HGB 8.8*  HCT 27.8*  PLT 337   BMET  Recent Labs  08/26/13 0443 08/27/13 0500  NA 139 139  K 3.8 3.7  CL 100 100  CO2 28 27  GLUCOSE 102* 100*  BUN 17 18  CREATININE 1.35* 1.14*  CALCIUM 8.2* 8.4   PT/INR  Recent Labs  08/26/13 0443 08/27/13 0500  LABPROT 20.8* 19.8*  INR 1.85* 1.74*   Comprehensive Metabolic Panel:    Component Value Date/Time   NA 139 08/27/2013 0500   NA 139 08/26/2013 0443   K 3.7 08/27/2013 0500   K 3.8 08/26/2013 0443   CL 100 08/27/2013 0500   CL 100 08/26/2013 0443   CO2 27 08/27/2013 0500   CO2 28 08/26/2013 0443   BUN 18 08/27/2013 0500   BUN 17 08/26/2013 0443   CREATININE 1.14* 08/27/2013 0500   CREATININE 1.35* 08/26/2013 0443   GLUCOSE 100* 08/27/2013 0500   GLUCOSE 102* 08/26/2013 0443   CALCIUM 8.4 08/27/2013 0500   CALCIUM 8.2* 08/26/2013 0443   AST 22 08/23/2013 0417   AST 22 08/22/2013 1125   ALT 13 08/23/2013 0417   ALT 13 08/22/2013 1125   ALKPHOS 99 08/23/2013 0417   ALKPHOS 96 08/22/2013 1125   BILITOT 0.4 08/23/2013 0417   BILITOT 0.3 08/22/2013 1125   PROT 6.6 08/23/2013 0417   PROT 6.9 08/22/2013 1125   ALBUMIN 2.9* 08/23/2013 0417   ALBUMIN 2.9* 08/22/2013 1125     Studies/Results: Dg Abd 2 Views  08/27/2013   CLINICAL DATA:  Nausea and vomiting, follow-up small bowel obstruction  EXAM: ABDOMEN - 2  VIEW  COMPARISON:  DG ABD 2 VIEWS dated 08/26/2013  FINDINGS: There remain loops of mildly distended gas-filled small bowel in the mid and lower abdomen. No free extraluminal gas collections are demonstrated. There is contrast within loops of normal calibered colon similar to that previously demonstrated. There are degenerative changes of the lumbar spine. The esophagogastric tube tip in proximal port lie in the region of the gastric body.  IMPRESSION: There has not been significant interval change in the appearance of the distal small bowel obstruction.   Electronically Signed   By: David  [place]   On: 08/27/2013 09:32   Dg Abd 2 Views  08/26/2013   CLINICAL DATA:  Small bowel obstruction  EXAM: ABDOMEN - 2 VIEW  COMPARISON:  08/25/2013  FINDINGS: NG tube within the stomach. Similar pattern of moderately dilated small bowel in the right abdomen. Contrast remains present in the colon. No free air evident. Minimal interval change.  IMPRESSION: Stable small bowel dilatation compatible with partial SBO. No free air.   Electronically Signed   By: Ruel Favorsrevor  Shick M.D.   On: 08/26/2013 08:28    Anti-infectives: Anti-infectives   Start     Dose/Rate Route Frequency Ordered Stop  08/23/13 1600  piperacillin-tazobactam (ZOSYN) IVPB 3.375 g  Status:  Discontinued     3.375 g 12.5 mL/hr over 240 Minutes Intravenous 3 times per day 08/23/13 0638 08/25/13 1807   08/22/13 1800  piperacillin-tazobactam (ZOSYN) IVPB 3.375 g  Status:  Discontinued     3.375 g 12.5 mL/hr over 240 Minutes Intravenous 3 times per day 08/22/13 1743 08/23/13 0638   08/22/13 1700  doxycycline (VIBRAMYCIN) 100 mg in dextrose 5 % 250 mL IVPB  Status:  Discontinued     100 mg 125 mL/hr over 120 Minutes Intravenous Every 12 hours 08/22/13 1603 08/26/13 1224      Assessment Partial SBO-contrast is through to rectum-ng output less   LOS: 5 days   Plan: Would continue non-operative management.   Jim Desanctis Vandy Fong 08/27/2013

## 2013-08-28 DIAGNOSIS — K56609 Unspecified intestinal obstruction, unspecified as to partial versus complete obstruction: Secondary | ICD-10-CM

## 2013-08-28 LAB — GLUCOSE, CAPILLARY
GLUCOSE-CAPILLARY: 104 mg/dL — AB (ref 70–99)
GLUCOSE-CAPILLARY: 114 mg/dL — AB (ref 70–99)
Glucose-Capillary: 103 mg/dL — ABNORMAL HIGH (ref 70–99)
Glucose-Capillary: 139 mg/dL — ABNORMAL HIGH (ref 70–99)
Glucose-Capillary: 184 mg/dL — ABNORMAL HIGH (ref 70–99)
Glucose-Capillary: 94 mg/dL (ref 70–99)

## 2013-08-28 LAB — BASIC METABOLIC PANEL
BUN: 20 mg/dL (ref 6–23)
CO2: 26 mEq/L (ref 19–32)
Calcium: 8.6 mg/dL (ref 8.4–10.5)
Chloride: 99 mEq/L (ref 96–112)
Creatinine, Ser: 1.03 mg/dL (ref 0.50–1.10)
GFR calc non Af Amer: 47 mL/min — ABNORMAL LOW (ref >90)
GFR, EST AFRICAN AMERICAN: 55 mL/min — AB (ref >90)
Glucose, Bld: 108 mg/dL — ABNORMAL HIGH (ref 70–99)
POTASSIUM: 3.8 meq/L (ref 3.7–5.3)
Sodium: 139 mEq/L (ref 137–147)

## 2013-08-28 LAB — PROTIME-INR
INR: 1.76 — ABNORMAL HIGH (ref 0.00–1.49)
Prothrombin Time: 20 seconds — ABNORMAL HIGH (ref 11.6–15.2)

## 2013-08-28 MED ORDER — NICOTINE 14 MG/24HR TD PT24
14.0000 mg | MEDICATED_PATCH | Freq: Every day | TRANSDERMAL | Status: DC
  Administered 2013-08-28 – 2013-09-01 (×5): 14 mg via TRANSDERMAL
  Filled 2013-08-28 (×5): qty 1

## 2013-08-28 NOTE — Progress Notes (Signed)
Subjective: Comfortable.  Had a BM this AM.  Objective: Vital signs in last 24 hours: Temp:  [97.2 F (36.2 C)-98.2 F (36.8 C)] 97.2 F (36.2 C) (04/12 0600) Pulse Rate:  [88-93] 88 (04/12 0600) Resp:  [16-18] 18 (04/12 0600) BP: (137-184)/(72-82) 162/72 mmHg (04/12 0600) SpO2:  [93 %-98 %] 98 % (04/12 1036) Weight:  [214 lb 15.2 oz (97.5 kg)] 214 lb 15.2 oz (97.5 kg) (04/12 0500) Last BM Date: 08/25/13  Intake/Output from previous day: 04/11 0701 - 04/12 0700 In: 600 [I.V.:600] Out: 1950 [Urine:1550; Emesis/NG output:400] Intake/Output this shift: Total I/O In: 800 [I.V.:800] Out: 300 [Urine:300]  PE: General- In NAD Abdomen-soft, active bowel sounds, not distended or tender  Lab Results:  No results found for this basename: WBC, HGB, HCT, PLT,  in the last 72 hours BMET  Recent Labs  08/27/13 0500 08/28/13 0750  NA 139 139  K 3.7 3.8  CL 100 99  CO2 27 26  GLUCOSE 100* 108*  BUN 18 20  CREATININE 1.14* 1.03  CALCIUM 8.4 8.6   PT/INR  Recent Labs  08/27/13 0500 08/28/13 0750  LABPROT 19.8* 20.0*  INR 1.74* 1.76*   Comprehensive Metabolic Panel:    Component Value Date/Time   NA 139 08/28/2013 0750   NA 139 08/27/2013 0500   K 3.8 08/28/2013 0750   K 3.7 08/27/2013 0500   CL 99 08/28/2013 0750   CL 100 08/27/2013 0500   CO2 26 08/28/2013 0750   CO2 27 08/27/2013 0500   BUN 20 08/28/2013 0750   BUN 18 08/27/2013 0500   CREATININE 1.03 08/28/2013 0750   CREATININE 1.14* 08/27/2013 0500   GLUCOSE 108* 08/28/2013 0750   GLUCOSE 100* 08/27/2013 0500   CALCIUM 8.6 08/28/2013 0750   CALCIUM 8.4 08/27/2013 0500   AST 22 08/23/2013 0417   AST 22 08/22/2013 1125   ALT 13 08/23/2013 0417   ALT 13 08/22/2013 1125   ALKPHOS 99 08/23/2013 0417   ALKPHOS 96 08/22/2013 1125   BILITOT 0.4 08/23/2013 0417   BILITOT 0.3 08/22/2013 1125   PROT 6.6 08/23/2013 0417   PROT 6.9 08/22/2013 1125   ALBUMIN 2.9* 08/23/2013 0417   ALBUMIN 2.9* 08/22/2013 1125     Studies/Results: Dg Abd 2  Views  08/27/2013   CLINICAL DATA:  Nausea and vomiting, follow-up small bowel obstruction  EXAM: ABDOMEN - 2 VIEW  COMPARISON:  DG ABD 2 VIEWS dated 08/26/2013  FINDINGS: There remain loops of mildly distended gas-filled small bowel in the mid and lower abdomen. No free extraluminal gas collections are demonstrated. There is contrast within loops of normal calibered colon similar to that previously demonstrated. There are degenerative changes of the lumbar spine. The esophagogastric tube tip in proximal port lie in the region of the gastric body.  IMPRESSION: There has not been significant interval change in the appearance of the distal small bowel obstruction.   Electronically Signed   By: David  [place]   On: 08/27/2013 09:32    Anti-infectives: Anti-infectives   Start     Dose/Rate Route Frequency Ordered Stop   08/23/13 1600  piperacillin-tazobactam (ZOSYN) IVPB 3.375 g  Status:  Discontinued     3.375 g 12.5 mL/hr over 240 Minutes Intravenous 3 times per day 08/23/13 0638 08/25/13 1807   08/22/13 1800  piperacillin-tazobactam (ZOSYN) IVPB 3.375 g  Status:  Discontinued     3.375 g 12.5 mL/hr over 240 Minutes Intravenous 3 times per day 08/22/13 1743 08/23/13 1610  08/22/13 1700  doxycycline (VIBRAMYCIN) 100 mg in dextrose 5 % 250 mL IVPB  Status:  Discontinued     100 mg 125 mL/hr over 120 Minutes Intravenous Every 12 hours 08/22/13 1603 08/26/13 1224      Assessment Partial SBO-contrast is through to rectum-improving clinically.   LOS: 6 days   Plan: Clamp ng tube and try clear liquids.   Jim Desanctisodd J Daviel Allegretto 08/28/2013

## 2013-08-28 NOTE — Progress Notes (Signed)
Pt has bilateral lower extremity edema, and left arm edema. Elevated left arm.

## 2013-08-28 NOTE — Progress Notes (Signed)
[facility] PROGRESS NOTE  Alonza SmokerLouise P Olivera XBJ:[id number] DOB: [birth year] DOA: 08/22/2013 PCP: Mickie HillierLITTLE,[name], MD  Brief narrative: 80 year old female with past medical history of hypertension, atrial fibrillation, DVT and PE on coumadin, diabetes, recent hospitalization for UTI due to serratia (was supposed to be on cipro for 10 days after discharge 4/2) who [facility]22[facility]2013 with worsening mental status changed in past 24 hours prior to this admission. Pt is not a good historian as she is lethargic at this time. She was given morphine for pain in ED. Patient was apparently (per her daughters) hallucinating and thinking someone has robbed her house and then she did not know where she was and could not recognize her daughters.  In ED, vitals are stable. Blood work revealed WBC count 10.9, hemoglobin 10.2, creatinine 1.94. Abdominal x ray showed new distention of small bowel loops possible small bowel obstruction. This was also seen on CT abdomen.  Repeat abd film 4/8, with worsened SBO. Will place NG tube. Persistent dilated small bowel loops 4/9 despite NG suction.  Assessment and Plan:   Acute encephalopathy  Possible progressive dementia but also UTI. Cx from 3/30 with serratia.  Also wonder if SBO is playing a role in her confusion. Discontinue all antibiotics at this point. She is a little more alert today than in days prior. 4/12: much improved clinically; following commands and can carry a conversation.  SBO (small bowel obstruction)  Conservative management with IV fluids, n.p.o., antiemetics and analgesia. Patient is also on Reglan 2.5 mg IV every 12 hours. Bowel regimen is with Colace, MiraLAX and enema.  Abd film 4/8 with worsening SBO. Per daughter was nauseous today. Will place NG tube. Surgery following. Persistent SBO on repeated films. High risk for surgery. NG output has slowed down. No BM today. hopeful for some recovery of bowel function. No surgery is contemplated  at present. She is clinically improved today. Surgery is planning on clamping NG and trying clears today.  Diabetes mellitus type 2, uncontrolled  We will hold off on by mouth medications until small bowel obstruction resolves. Until then we will use a sliding scale insulin. Well controlled at present.  Atrial fibrillation  Rate controlled. Will DC coumadin at this time given NG to suction and possibility of surgery vs palliative care.  History of pulmonary embolism  Off coumadin.  HTN (hypertension)  Holding all medications given NG to suction.  Leukocytosis, unspecified  Secondary to UTI/SBO  Anemia of chronic disease  Secondary to history of CKD. Hemoglobin is stable at 9.4. No current indications for transfusion.  Chronic kidney disease (CKD), stage IV (severe)  Creatinine 2.11 on 08/17/2013 and on this admission 1.9. Creatinine is 1.49 as of 4/9. Continue low rate IV fluids.  Code Status: Full  Family Communication: Family meeting with 2 daughters and sister. Discussed progression of SBO despite conservative measures. They understand she is very high risk for surgery, and at the same time they understand that if SBO progresses without surgery, she could have an abdominal perforation or mesenteric ischemia. They would like to wait 24-48 hours to see if any improvement before making decision on proceeding with surgery (despite risks) vs palliation. Disposition Plan: Remains inpatient   Henderson CloudEstela Y Hernandez Acosta, MD  [facility] Pager [phone]  If 7PM-7AM, please contact night-coverage [email or web address] Password [email or web address] 08/28/2013, 4:01 PM   LOS: 6 days   Consultants:  Surgery  Procedures:  LE bilateral venous doppler - no evidence of DVT (08/22/2013)  Antibiotics:  Zosyn 08/22/2013 -->  Doxycycline 08/22/2013 -->   HPI/Subjective:  More awake today and agitated.  Objective: Filed Vitals:   08/28/13 0500 08/28/13 0600 08/28/13 1036 08/28/13 1400  BP:  162/72   182/74  Pulse:  88  89  Temp:  97.2 F (36.2 C)  98.2 F (36.8 C)  TempSrc:  Oral  Oral  Resp:  18  18  Height:      Weight: 97.5 kg (214 lb 15.2 oz)     SpO2:  93% 98% 92%    Intake/Output Summary (Last 24 hours) at 08/28/13 1601 Last data filed at 08/28/13 1400  Gross per 24 hour  Intake   1200 ml  Output   1600 ml  Net   -400 ml    Exam:   General:  awake  Cardiovascular: Irregular rhythm, S1/S2 appreciated  Respiratory: Clear to auscultation bilaterally, no wheezing  Abdomen: Distended, nontender, bowel sounds present, no guarding  Extremities: Lower extremity edema and erythema appreciated, pulses DP and PT palpable bilaterally   Data Reviewed: Basic Metabolic Panel:  Recent Labs Lab 08/22/13 1125 08/23/13 0417 08/25/13 0445 08/26/13 0443 08/27/13 0500 08/28/13 0750  NA 135* 135* 140 139 139 139  K 3.9 4.0 3.9 3.8 3.7 3.8  CL 95* 96 101 100 100 99  CO2 26 25 29 28 27 26   GLUCOSE 130* 153* 102* 102* 100* 108*  BUN 28* 30* 20 17 18 20   CREATININE 1.86* 1.84* 1.49* 1.35* 1.14* 1.03  CALCIUM 8.2* 8.3* 8.5 8.2* 8.4 8.6  MG 2.2  --   --   --   --   --   PHOS 3.6  --   --   --   --   --    Liver Function Tests:  Recent Labs Lab 08/22/13 1125 08/23/13 0417  AST 22 22  ALT 13 13  ALKPHOS 96 99  BILITOT 0.3 0.4  PROT 6.9 6.6  ALBUMIN 2.9* 2.9*   No results found for this basename: LIPASE, AMYLASE,  in the last 168 hours No results found for this basename: AMMONIA,  in the last 168 hours CBC:  Recent Labs Lab 08/22/13 0630 08/22/13 1125 08/23/13 0417 08/25/13 0445  WBC 10.9* 11.7* 11.9* 10.9*  NEUTROABS 7.5 7.1  --   --   HGB 10.2* 9.3* 9.4* 8.8*  HCT 31.6* 28.5* 28.4* 27.8*  MCV 81.7 81.7 81.8 83.5  PLT 385 369 358 337   Cardiac Enzymes: No results found for this basename: CKTOTAL, CKMB, CKMBINDEX, TROPONINI,  in the last 168 hours BNP: No components found with this basename: POCBNP,  CBG:  Recent Labs Lab 08/27/13 1946  08/28/13 0005 08/28/13 0404 08/28/13 0805 08/28/13 1154  GLUCAP 98 94 104* 103* 114*    No results found for this or any previous visit (from the past 240 hour(s)).   Studies: Ct Abdomen Pelvis Wo Contrast 08/22/2013    IMPRESSION: Slight prominence of abdominal and pelvic small bowel loops with decompressed distal small bowel. Findings may reflect early or low grade partial small bowel obstruction.  Decreasing rectum sigmoid stool burden.  Colonic diverticulosis.  Suprapubic catheter remains in the bladder which is decompressed.   Electronically Signed   By: Charlett Nose M.D.   On: 08/22/2013 09:05   Dg Abd Acute W/chest 08/22/2013    IMPRESSION: 1. New distention of small bowel loops to 4.2 cm in maximal diameter, raising concern for some degree of small bowel obstruction. Alternately, this could reflect mild  ileus. No free intra-abdominal air seen. 2. Mild vascular congestion noted; lungs remain grossly clear.   Electronically Signed   By: Roanna Raider M.D.   On: 08/22/2013 06:37    Scheduled Meds: . diltiazem  120 mg Oral BID  . docusate sodium  100 mg Oral BID  . doxycycline   100 mg Intravenous Q12H  . insulin aspart  0-15 Units Subcutaneous TID WC  . insulin aspart  0-5 Units Subcutaneous QHS  . lidocaine  1 patch Transdermal Q24H  . methimazole  5 mg Oral Daily  . metoCLOPramide   2.5 mg Intravenous Q12H  . mometasone-formoterol  2 puff Inhalation BID  . piperacillin-tazobactam   3.375 g Intravenous 3 times per day  . polyethylene glycol  17 g Oral BID  . Warfarin    Does not apply q1800   Continuous Infusions: . sodium chloride 50 mL/hr at 08/28/13 0222

## 2013-08-29 ENCOUNTER — Inpatient Hospital Stay (HOSPITAL_COMMUNITY): Payer: Medicare Other

## 2013-08-29 LAB — GLUCOSE, CAPILLARY
GLUCOSE-CAPILLARY: 115 mg/dL — AB (ref 70–99)
GLUCOSE-CAPILLARY: 120 mg/dL — AB (ref 70–99)
GLUCOSE-CAPILLARY: 156 mg/dL — AB (ref 70–99)
Glucose-Capillary: 112 mg/dL — ABNORMAL HIGH (ref 70–99)
Glucose-Capillary: 121 mg/dL — ABNORMAL HIGH (ref 70–99)
Glucose-Capillary: 115 mg/dL — ABNORMAL HIGH (ref 70–99)
Glucose-Capillary: 186 mg/dL — ABNORMAL HIGH (ref 70–99)

## 2013-08-29 LAB — BASIC METABOLIC PANEL
BUN: 18 mg/dL (ref 6–23)
CALCIUM: 8.3 mg/dL — AB (ref 8.4–10.5)
CO2: 27 meq/L (ref 19–32)
CREATININE: 0.95 mg/dL (ref 0.50–1.10)
Chloride: 102 mEq/L (ref 96–112)
GFR calc Af Amer: 61 mL/min — ABNORMAL LOW (ref >90)
GFR calc non Af Amer: 52 mL/min — ABNORMAL LOW (ref >90)
GLUCOSE: 125 mg/dL — AB (ref 70–99)
Potassium: 3.8 mEq/L (ref 3.7–5.3)
Sodium: 139 mEq/L (ref 137–147)

## 2013-08-29 LAB — PROTIME-INR
INR: 1.91 — ABNORMAL HIGH (ref 0.00–1.49)
Prothrombin Time: 21.3 seconds — ABNORMAL HIGH (ref 11.6–15.2)

## 2013-08-29 NOTE — Progress Notes (Signed)
+  BMs yesterday and this am. Had some emesis per family member  Alert, nad, obese Ng clamped Obese, soft, nt, +BS  Films - some dilated SB  pSBO - improving. Having BMs. Pt vomited about 15cc of reddish clear sputum? (not c/w gastric contents) after I had her do some IS  Cont NG clamp; cont clears.  Hopefully pull NG in AM  Mary SellaEric M. Andrey CampanileWilson, MD, FACS General, Bariatric, & Minimally Invasive Surgery [facility] [place]

## 2013-08-29 NOTE — Progress Notes (Signed)
Patient is alert, follows commands appropriately, oriented to person, place, situation and disoriented to time. Her vital signs are stable. Patient is now on clear liquid diet and complains of nausea atimes which is relieved by zofran. Patient has generalized weakness but is very pleasant today.  Paarth Cropper Venita Lick(Student Nurse) 08/28/13. [phone]

## 2013-08-29 NOTE — Progress Notes (Signed)
Subjective: Her NG tube was clamped yesterday and supposedly she vomited last night when trying clears.  She's not c/o any N/V or abdominal pain at this time.  She wants to get up and walk.  She also wants the NG tube removed.  She's much more oriented today.    Objective: Vital signs in last 24 hours: Temp:  [98.2 F (36.8 C)-99 F (37.2 C)] 99 F (37.2 C) (04/13 0507) Pulse Rate:  [84-94] 94 (04/13 0507) Resp:  [16-18] 16 (04/13 0507) BP: (138-182)/(74-80) 138/79 mmHg (04/13 0507) SpO2:  [92 %-98 %] 94 % (04/13 0507) Weight:  [214 lb 1.1 oz (97.1 kg)] 214 lb 1.1 oz (97.1 kg) (04/13 0500) Last BM Date: 08/28/13  Intake/Output from previous day: 04/12 0701 - 04/13 0700 In: 2040 [P.O.:240; I.V.:1800] Out: 1450 [Urine:1450] Intake/Output this shift:    PE: Gen:  Alert, NAD, pleasant Abd: Soft, mild distension, NT, +BS, no HSM Ext:  Much less erythema and edema today compared to previous days  Lab Results:  No results found for this basename: WBC, HGB, HCT, PLT,  in the last 72 hours BMET  Recent Labs  08/28/13 0750 08/29/13 0457  NA 139 139  K 3.8 3.8  CL 99 102  CO2 26 27  GLUCOSE 108* 125*  BUN 20 18  CREATININE 1.03 0.95  CALCIUM 8.6 8.3*   PT/INR  Recent Labs  08/28/13 0750 08/29/13 0457  LABPROT 20.0* 21.3*  INR 1.76* 1.91*   CMP     Component Value Date/Time   NA 139 08/29/2013 0457   K 3.8 08/29/2013 0457   CL 102 08/29/2013 0457   CO2 27 08/29/2013 0457   GLUCOSE 125* 08/29/2013 0457   BUN 18 08/29/2013 0457   CREATININE 0.95 08/29/2013 0457   CALCIUM 8.3* 08/29/2013 0457   PROT 6.6 08/23/2013 0417   ALBUMIN 2.9* 08/23/2013 0417   AST 22 08/23/2013 0417   ALT 13 08/23/2013 0417   ALKPHOS 99 08/23/2013 0417   BILITOT 0.4 08/23/2013 0417   GFRNONAA 52* 08/29/2013 0457   GFRAA 61* 08/29/2013 0457   Lipase     Component Value Date/Time   LIPASE 18 08/15/2013 1205       Studies/Results: Dg Abd 2 Views  08/27/2013   CLINICAL DATA:  Nausea and  vomiting, follow-up small bowel obstruction  EXAM: ABDOMEN - 2 VIEW  COMPARISON:  DG ABD 2 VIEWS dated 08/26/2013  FINDINGS: There remain loops of mildly distended gas-filled small bowel in the mid and lower abdomen. No free extraluminal gas collections are demonstrated. There is contrast within loops of normal calibered colon similar to that previously demonstrated. There are degenerative changes of the lumbar spine. The esophagogastric tube tip in proximal port lie in the region of the gastric body.  IMPRESSION: There has not been significant interval change in the appearance of the distal small bowel obstruction.   Electronically Signed   By: David  SwazilandJordan   On: 08/27/2013 09:32    Anti-infectives: Anti-infectives   Start     Dose/Rate Route Frequency Ordered Stop   08/23/13 1600  piperacillin-tazobactam (ZOSYN) IVPB 3.375 g  Status:  Discontinued     3.375 g 12.5 mL/hr over 240 Minutes Intravenous 3 times per day 08/23/13 0638 08/25/13 1807   08/22/13 1800  piperacillin-tazobactam (ZOSYN) IVPB 3.375 g  Status:  Discontinued     3.375 g 12.5 mL/hr over 240 Minutes Intravenous 3 times per day 08/22/13 1743 08/23/13 0638   08/22/13 1700  doxycycline (  VIBRAMYCIN) 100 mg in dextrose 5 % 250 mL IVPB  Status:  Discontinued     100 mg 125 mL/hr over 120 Minutes Intravenous Every 12 hours 08/22/13 1603 08/26/13 1224       Assessment/Plan Abdominal pain  Abdominal distension  Constipation ? Early pSBO   Plan:  1. Had a good BM today  2. NPO, IVF, pain control, antiemetics, cont enemas/dulcolax   3. SCD's, on warfarin  4. May need to have palliative if not improved. If her abdominal distention/pSBO does not resolve she would be at risk for perforation or ischemic bowel. Her KUB is about the same today.  Her clinical picture is about the same as Friday.   5. NG tube with less output 400mL yesterday.  Hs been clamped since yesterday.  Continue NG for now since recently vomited, can re-try  clears.    LOS: 7 days    Aris GeorgiaMegan Dort 08/29/2013, 7:42 AM Pager: [phone]

## 2013-08-29 NOTE — Progress Notes (Signed)
CSW assisting with d/c planning. Met with pt / family to offer support. Pt is more alert. She no longer requires restraints. CSW will continue to follow to assist with SNF placement when stable.  [name] LCSW [phone]

## 2013-08-29 NOTE — Progress Notes (Addendum)
Patient ID: Krystal [name] Catanzaro, female   DOB: Aug 10, 1924, 80 y.o.   MRN: [id number]  [facility] PROGRESS NOTE  Krystal [name] Retherford NWG:[id number] DOB: Aug 10, 1924 DOA: 08/22/2013 PCP: Mickie HillierLITTLE,[name], MD  Brief narrative: 80 year old female with past medical history of hypertension, atrial fibrillation, DVT and PE on coumadin, diabetes, recent hospitalization for UTI due to serratia (was supposed to be on cipro for 10 days after discharge 4/2) who [facility] with worsening mental status changed in past 24 hours prior to this admission. Pt is not a good historian as she is lethargic at this time. She was given morphine for pain in ED. Patient was apparently (per her daughters) hallucinating and thinking someone has robbed her house and then she did not know where she was and could not recognize her daughters.   In ED, vitals are stable. Blood work revealed WBC count 10.9, hemoglobin 10.2, creatinine 1.94. Abdominal x ray showed new distention of small bowel loops possible small bowel obstruction. This was also seen on CT abdomen. Repeat abd film 4/8, with worsened SBO. Will place NG tube. Persistent dilated small bowel loops 4/9 despite NG suction.   Assessment and Plan:  Acute encephalopathy  Possible progressive dementia but also UTI. Cx from 3/30 with serratia. Also wonder if SBO is playing a role in her confusion. Discontinue all antibiotics at this point. She is more alert today than in days prior. 4/12: much improved clinically; following commands and can carry a conversation. Reports feeling better this AM. SBO (small bowel obstruction)  Conservative management with IV fluids, n.[name].o., antiemetics and analgesia. Patient is also on Reglan 2.5 mg IV every 12 hours. Bowel regimen is with Colace, MiraLAX and enema. Abd film 4/8 with worsening SBO. Pt had a good BM today, continue to keep NPO, continue IVF, pain control, antiemetics, cont enemas/dulcolax. Appreciate surgery input. Pt is  determined to be high risk for surgical intervention due to risk of perforation. KUB is essentially unchanged today. NG tube with less output 400mL yesterday. Hs been clamped since yesterday. Continue NG for now since recently vomited, can re-try clears per surgery recommendations.  Crackles on exam with LE swelling - will ask for CXR, lower the rate of IVF to 30 cc/hr for now Diabetes mellitus type 2, uncontrolled  We will hold off on by mouth medications until small bowel obstruction resolves. Until then we will use a sliding scale insulin. Reasonable inpatient control.  Atrial fibrillation  Rate controlled. Will DC coumadin at this time given NG to suction. History of pulmonary embolism  Off coumadin for now HTN (hypertension)  Holding all medications given NG to suction.  Leukocytosis, unspecified  Secondary to UTI/SBO  Anemia of chronic disease  Secondary to history of CKD. Hemoglobin drop on admission 9.4 --> 8.8, no signs of overt bleeding. Repeat CBC in AM. Chronic kidney disease (CKD), stage IV (severe)  Creatinine 2.11 on 08/17/2013 and on this admission 1.9. Creatinine is now WNL. Moderate malnutrition Secondary to acute illness, keep NPO for now and try clears today.   Code Status: DNR Family Communication: Family meeting with 2 daughters and sister. Discussed progression of SBO despite conservative measures. They understand she is very high risk for surgery, and at the same time they understand that if SBO progresses without surgery, she could have an abdominal perforation or mesenteric ischemia.  Disposition Plan: Remains inpatient   Consultants:  Surgery Procedures:  LE bilateral venous doppler - no evidence of DVT (08/22/2013) Dg  Abd 2 Views  08/29/2013  No significant change, mild small bowel dilation with scattered air-fluid levels supports a low grade SBO. No free air.  Antibiotics:  Zosyn 08/22/2013 -->  Doxycycline 08/22/2013 -->   HPI/Subjective: No events  overnight.   Objective: Filed Vitals:   08/28/13 1400 08/28/13 2010 08/29/13 0500 08/29/13 0507  BP: 182/74 156/80  138/79  Pulse: 89 84  94  Temp: 98.2 F (36.8 C) 98.5 F (36.9 C)  99 F (37.2 C)  TempSrc: Oral Oral  Axillary  Resp: 18 16  16   Height:      Weight:   97.1 kg (214 lb 1.1 oz)   SpO2: 92% 93%  94%    Intake/Output Summary (Last 24 hours) at 08/29/13 1147 Last data filed at 08/29/13 1102  Gross per 24 hour  Intake   1465 ml  Output   1450 ml  Net     15 ml    Exam:   General:  Pt is alert, follows commands appropriately, not in acute distress  Cardiovascular: Regular rate and rhythm, S1/S2, no murmurs, no rubs, no gallops  Respiratory: Clear to auscultation bilaterally, no wheezing, bibasilar crackles   Abdomen: Soft, tender in epigastric area, non distended, bowel sounds present, no guarding  Neuro: Grossly nonfocal  Data Reviewed: Basic Metabolic Panel:  Recent Labs Lab 08/25/13 0445 08/26/13 0443 08/27/13 0500 08/28/13 0750 08/29/13 0457  NA 140 139 139 139 139  K 3.9 3.8 3.7 3.8 3.8  CL 101 100 100 99 102  CO2 29 28 27 26 27   GLUCOSE 102* 102* 100* 108* 125*  BUN 20 17 18 20 18   CREATININE 1.49* 1.35* 1.14* 1.03 0.95  CALCIUM 8.5 8.2* 8.4 8.6 8.3*   Liver Function Tests:  Recent Labs Lab 08/23/13 0417  AST 22  ALT 13  ALKPHOS 99  BILITOT 0.4  PROT 6.6  ALBUMIN 2.9*   CBC:  Recent Labs Lab 08/23/13 0417 08/25/13 0445  WBC 11.9* 10.9*  HGB 9.4* 8.8*  HCT 28.4* 27.8*  MCV 81.8 83.5  PLT 358 337   CBG:  Recent Labs Lab 08/28/13 1607 08/28/13 2005 08/28/13 2355 08/29/13 0338 08/29/13 0727  GLUCAP 139* 184* 112* 121* 115*    Scheduled Meds: . antiseptic oral rinse  15 mL Mouth Rinse q12n4p  . bisacodyl  10 mg Rectal Daily  . chlorhexidine  15 mL Mouth Rinse BID  . insulin aspart  0-15 Units Subcutaneous 6 times per day  . lidocaine  1 patch Transdermal Q24H  . metoCLOPramide (REGLAN) injection  2.5 mg  Intravenous Q12H  . mometasone-formoterol  2 puff Inhalation BID  . nicotine  14 mg Transdermal Daily  . polyethylene glycol  17 g Oral BID  . sodium phosphate  1 enema Rectal Once   Continuous Infusions: . sodium chloride 50 mL/hr at 08/28/13 2010   Dorothea OgleIskra M Mccrae Speciale, MD  [facility] Pager [phone]  If 7PM-7AM, please contact night-coverage [email or web address] Password [email or web address] 08/29/2013, 11:47 AM   LOS: 7 days

## 2013-08-30 DIAGNOSIS — E876 Hypokalemia: Secondary | ICD-10-CM

## 2013-08-30 LAB — CBC
HEMATOCRIT: 27.1 % — AB (ref 36.0–46.0)
Hemoglobin: 8.6 g/dL — ABNORMAL LOW (ref 12.0–15.0)
MCH: 26.3 pg (ref 26.0–34.0)
MCHC: 31.7 g/dL (ref 30.0–36.0)
MCV: 82.9 fL (ref 78.0–100.0)
Platelets: 296 10*3/uL (ref 150–400)
RBC: 3.27 MIL/uL — ABNORMAL LOW (ref 3.87–5.11)
RDW: 14.5 % (ref 11.5–15.5)
WBC: 9.6 10*3/uL (ref 4.0–10.5)

## 2013-08-30 LAB — BASIC METABOLIC PANEL
BUN: 15 mg/dL (ref 6–23)
CO2: 27 mEq/L (ref 19–32)
CREATININE: 1.03 mg/dL (ref 0.50–1.10)
Calcium: 8.4 mg/dL (ref 8.4–10.5)
Chloride: 102 mEq/L (ref 96–112)
GFR, EST AFRICAN AMERICAN: 55 mL/min — AB (ref >90)
GFR, EST NON AFRICAN AMERICAN: 47 mL/min — AB (ref >90)
Glucose, Bld: 129 mg/dL — ABNORMAL HIGH (ref 70–99)
Potassium: 3.5 mEq/L — ABNORMAL LOW (ref 3.7–5.3)
Sodium: 140 mEq/L (ref 137–147)

## 2013-08-30 LAB — GLUCOSE, CAPILLARY
GLUCOSE-CAPILLARY: 125 mg/dL — AB (ref 70–99)
GLUCOSE-CAPILLARY: 170 mg/dL — AB (ref 70–99)
GLUCOSE-CAPILLARY: 153 mg/dL — AB (ref 70–99)
Glucose-Capillary: 105 mg/dL — ABNORMAL HIGH (ref 70–99)
Glucose-Capillary: 165 mg/dL — ABNORMAL HIGH (ref 70–99)
Glucose-Capillary: 218 mg/dL — ABNORMAL HIGH (ref 70–99)

## 2013-08-30 LAB — MAGNESIUM: Magnesium: 1.5 mg/dL (ref 1.5–2.5)

## 2013-08-30 LAB — PROTIME-INR
INR: 1.94 — ABNORMAL HIGH (ref 0.00–1.49)
PROTHROMBIN TIME: 21.6 s — AB (ref 11.6–15.2)

## 2013-08-30 MED ORDER — POTASSIUM CHLORIDE 10 MEQ/100ML IV SOLN
10.0000 meq | INTRAVENOUS | Status: AC
  Administered 2013-08-30 (×4): 10 meq via INTRAVENOUS
  Filled 2013-08-30 (×4): qty 100

## 2013-08-30 MED ORDER — BOOST / RESOURCE BREEZE PO LIQD
1.0000 | Freq: Three times a day (TID) | ORAL | Status: DC
  Administered 2013-08-30 – 2013-09-01 (×5): 1 via ORAL

## 2013-08-30 NOTE — Progress Notes (Signed)
INITIAL NUTRITION ASSESSMENT  DOCUMENTATION CODES Per approved criteria  -Morbid Obesity   INTERVENTION: - Resource Breeze TID - Diet advancement per MD, hopefully advanced to full liquids by tomorrow per surgery's notes - Will continue to monitor   NUTRITION DIAGNOSIS: Inadequate oral intake related to clear liquid diet as evidenced by diet order.   Goal: Advance diet as tolerated to diabetic diet  Monitor:  Weights, labs, diet advancement  Reason for Assessment: NPO/clear liquids x 7 days  80 y.o. female  Admitting Dx: Acute encephalopathy  ASSESSMENT: Pt with hx of HTN, GERD, DM, suprapubic catheter here with back pain and abdominal pain. She was admitted a week ago for constipation and ileus. The last several days she's been having worsening abdominal distention as well as back pain. She also noticed that both of her legs are swollen worse on the right side. She's been having loose bowel movements and feeling nauseous but not vomiting. Her appetite was greatly decreased PTA. Daughter reported week long history of multiple hallucinations of people in her home. Found to have possible early small bowel obstruction and acute encephalopathy which is much improved per MD.   NGT placed placed 4/8, clamped 4/13, pulled out by pt today. No orders to replace.   Met with pt and niece who report pt was eating well at home - cereal for breakfast, a sandwich and salad for lunch, and 'country foods' like beans, potatoes, and meat for dinner. Her daughters do the cooking at home. Pt reports stable weight. Niece in room and reports she thought pt had lost 20 pounds since admission due to not eating for 1 week and losing fluid. Pt reports she has been tolerating clear liquids but is tired of jello and broth. Provided sample of [employer].   Potassium low, getting IV replacement  Nutrition Focused Physical Exam:  Subcutaneous Fat:  Orbital Region: WNL Upper Arm Region: mild muscle  wasting Thoracic and Lumbar Region: WNL  Muscle:  Temple Region: WNL Clavicle Bone Region: WNL Clavicle and Acromion Bone Region: WNL Scapular Bone Region: NA Dorsal Hand: WNL Patellar Region: NA Anterior Thigh Region: NA Posterior Calf Region: NA  Edema: Non-pitting RUE, LUE edema, +1 RLE, LLE edema    Height: Ht Readings from Last 1 Encounters:  08/22/13 4' 11.84" (1.52 m)    Weight: Wt Readings from Last 1 Encounters:  08/29/13 214 lb 1.1 oz (97.1 kg)    Ideal Body Weight: 100 lbs   % Ideal Body Weight: 214%  Wt Readings from Last 10 Encounters:  08/29/13 214 lb 1.1 oz (97.1 kg)  08/16/13 226 lb 10.1 oz (102.8 kg)  03/03/13 203 lb 0.7 oz (92.1 kg)  12/20/12 199 lb 12.8 oz (90.629 kg)  09/22/12 190 lb (86.183 kg)  07/30/12 180 lb 12.8 oz (82.01 kg)  07/12/12 185 lb 12 oz (84.256 kg)  04/27/12 187 lb 1.9 oz (84.877 kg)  04/02/12 184 lb (83.462 kg)  04/02/12 184 lb (83.462 kg)    Usual Body Weight: 214 lbs   % Usual Body Weight: 100%  BMI:  Body mass index is 42.03 kg/(m^2). Class III extreme obesity  Estimated Nutritional Needs: Kcal: 1350-1550 Protein: 60-75g Fluid: 1.3-1.5L/day  Skin: Non-pitting RUE, LUE edema, +1 RLE, LLE edema  Diet Order: Clear Liquid  EDUCATION NEEDS: -No education needs identified at this time   Intake/Output Summary (Last 24 hours) at 08/30/13 1427 Last data filed at 08/30/13 1000  Gross per 24 hour  Intake    850 ml  Output    500 ml  Net    350 ml    Last BM: 4/13  Labs:   Recent Labs Lab 08/28/13 0750 08/29/13 0457 08/30/13 0504  NA 139 139 140  K 3.8 3.8 3.5*  CL 99 102 102  CO2 _0 BUN _1 CREATININE 1.03 0.95 1.03  CALCIUM 8.6 8.3* 8.4  MG  --   --  1.5  GLUCOSE 108* 125* 129*    CBG (last 3)   Recent Labs  08/30/13 0432 08/30/13 0814 08/30/13 1220  GLUCAP 125* 105* 165*    Scheduled Meds: . antiseptic oral rinse  15 mL Mouth Rinse q12n4p  . chlorhexidine  15 mL Mouth  Rinse BID  . insulin aspart  0-15 Units Subcutaneous 6 times per day  . lidocaine  1 patch Transdermal Q24H  . metoCLOPramide (REGLAN) injection  2.5 mg Intravenous Q12H  . mometasone-formoterol  2 puff Inhalation BID  . nicotine  14 mg Transdermal Daily  . potassium chloride  10 mEq Intravenous Q1 Hr x 4  . sodium phosphate  1 enema Rectal Once    Continuous Infusions: . sodium chloride 30 mL/hr at 08/29/13 1918    Past Medical History  Diagnosis Date  . Hypertension   . GERD (gastroesophageal reflux disease)   . Diabetes mellitus   . History of atrial fibrillation EPISODE YRS AGO  . Urinary retention     s/p suprapubic catheter November 2013  . Coronary artery disease cardiologist- dr Vidal Schwalbe--  lov 09-12-2010 in epic    non-obstructinve min. cad  . Foley catheter in place   . Edema of lower extremity BILATERAL LEG  . Hyperlipidemia   . History of pulmonary embolus (PE) 2010--  TAKES COUMADIN  . History of DVT of lower extremity   . Anticoagulated on Coumadin   . Generalized weakness AGE RELATED--  USES WALKER  . Vertigo   . OA (osteoarthritis)   . Thin skin FRAGILE  . Hypothyroidism NO MEDS  . Impaired hearing BILATERAL AIDS  . Chronic diarrhea INTERMITTANT    Past Surgical History  Procedure Laterality Date  . Knee surgery    . Tubal ligation    . Cardiac catheterization  09-27-2004  DR TENNENT    NORMAL LVF/ MILD IRREGULARITIES IN MID LAD W/ NO SIGNIFICANT OBSTRUCTIVE DISEASE  . Abdominal hysterectomy  1994  (APPROX)    W/ BILATERAL SALPINGO-OOPHORECTOMY  . Cholecystectomy  2000  . Knee arthroscopy  1998  (APPROX)  . Bladder suspension  2002  (APPROX)    W/ ANTERIOR AND POSTERIOR REPAIR  . Cataract extraction w/ intraocular lens  implant, bilateral    . Transthoracic echocardiogram  02-23-2009  (DX W/ PE & PAF)    EF 65-70%/ MILD MR/ MODERATE TR/ MILDLY DILATED RA  &  LA/  RV SYSTOLIC FUNCTION MODERATLY REDUCED   . Insertion of suprapubic catheter   04/02/2012    Procedure: INSERTION OF SUPRAPUBIC CATHETER;  Surgeon: Claybon Jabs, MD;  Location: [facility];  Service: Urology;  Laterality: N/A;  SUPRAPUBIC TUBE PLACEMENT  . Cystoscopy  04/02/2012    Procedure: CYSTOSCOPY;  Surgeon: Claybon Jabs, MD;  Location: [facility];  Service: Urology;;    Mikey College MS, [place], [place] Pager [phone] After Hours Pager

## 2013-08-30 NOTE — Progress Notes (Signed)
Patient pulled NG tube out.  Merdis DelayK. Schorr NP for hospitalist paged regarding tube.  Discussed patient small amount of emesis, surgeon note, and abdominal x-ray.  Instructed by Merdis DelayK. Schorr to receive input from on-call surgeon regarding NG tube.  Surgeon paged per answering service.   Scarlette SliceHannah Jett Fukuda, Nursing Student/[facility] RN

## 2013-08-30 NOTE — Progress Notes (Signed)
Patient is alert, follows commands appropriately, oriented to person, place, situation and disoriented to time. Her vital signs are stable. Patient is now tolerating clear liquid diet with no complains of nausea or vomiting. Physical therapist assisted patient to ambulate in hall way and patient tolerated well. Patient looks much happier today and shows no signs of distress. Venita LickEsther Ahlia Lemanski (Student Nurse). 08/30/2013. 1819

## 2013-08-30 NOTE — Progress Notes (Signed)
Patient ID: Krystal [name], female   DOB: 1924/05/21, 80 y.o.   MRN: [id number]  Krystal [name] PROGRESS NOTE  Krystal SmokerLouise P [name] WUJ:[id number] DOB: 1924/05/21 DOA: 08/22/2013 PCP: Krystal HillierLITTLE,Krystal [name]  Brief narrative: 80 year old female with past medical history of hypertension, atrial fibrillation, DVT and PE on coumadin, diabetes, recent hospitalization for UTI due to serratia (was supposed to be on cipro for 10 days after discharge 4/2) who [facility] with worsening mental status changed in past 24 hours prior to this admission. Pt is not a good historian as she is lethargic at this time. She was given morphine for pain in ED. Patient was apparently (per her daughters) hallucinating and thinking someone has robbed her house and then she did not know where she was and could not recognize her daughters.   In ED, vitals are stable. Blood work revealed WBC count 10.9, hemoglobin 10.2, creatinine 1.94. Abdominal x ray showed new distention of small bowel loops possible small bowel obstruction. This was also seen on CT abdomen. Repeat abd film 4/8, with worsened SBO. Will place NG tube. Persistent dilated small bowel loops 4/9 despite NG suction. Now tolerating clears, NG out.  Assessment and Plan:  Acute encephalopathy  Possible progressive dementia but also UTI. Cx from 3/30 with serratia. Also wonder if SBO is playing a role in her confusion. Discontinue all antibiotics at this point. She is more alert today than in days prior. 4/12: much improved clinically; following commands and can carry a conversation. Reports feeling better this AM. Completely resolved 4/14.  SBO (small bowel obstruction)  -Clinically improving. -NG out and tolerating clears.  Diabetes mellitus type 2, uncontrolled  We will hold off on by mouth medications until small bowel obstruction resolves. Until then we will use a sliding scale insulin. Reasonable inpatient control.   Atrial fibrillation  Rate  controlled. Will DC coumadin at this time given NG to suction. May consider restarting soon as patient is improving.  History of pulmonary embolism  Off coumadin for now  HTN (hypertension)  Consider restarting home meds soon if BP elevated and bowel function returns.  Leukocytosis, unspecified  Secondary to UTI/SBO   Anemia of chronic disease  Secondary to history of CKD. Hemoglobin drop on admission 9.4 --> 8.8, no signs of overt bleeding. Repeat CBC in AM.  Chronic kidney disease (CKD), stage IV (severe)  Creatinine 2.11 on 08/17/2013 and on this admission 1.9. Creatinine is now WNL.  Moderate malnutrition Secondary to acute illness, keep NPO for now and try clears today.   Code Status: DNR Family Communication: Family meeting with 2 daughters and sister. Discussed progression of SBO despite conservative measures. They understand she is very high risk for surgery, and at the same time they understand that if SBO progresses without surgery, she could have an abdominal perforation or mesenteric ischemia.  4/14: Discussed with niece at bedside. Significant improvement. Suspect may able to go forward with SNF in next few days after we increase her diet and if bowel function remains adequate, Disposition Plan: Likely SNF.  Consultants:  Surgery Procedures:  LE bilateral venous doppler - no evidence of DVT (08/22/2013) Dg Abd 2 Views  08/29/2013  No significant change, mild small bowel dilation with scattered air-fluid levels supports a low grade SBO. No free air.  Antibiotics:  Zosyn 08/22/2013 -->  Doxycycline 08/22/2013 -->   HPI/Subjective: No events overnight.   Objective: Filed Vitals:   08/29/13 2212 08/30/13 0530 08/30/13 1323 08/30/13 1400  BP: 148/86 138/74  117/67  Pulse: 85 92  87  Temp: 98.2 F (36.8 C) 98.5 F (36.9 C)  97.3 F (36.3 C)  TempSrc: Oral Oral  Oral  Resp: 18 18  18   Height:      Weight:      SpO2: 92% 93% 94% 97%    Intake/Output Summary (Last  24 hours) at 08/30/13 1741 Last data filed at 08/30/13 1400  Gross per 24 hour  Intake    970 ml  Output    700 ml  Net    270 ml    Exam:   General:  Pt is alert, follows commands appropriately, not in acute distress  Cardiovascular: Regular rate and rhythm, S1/S2, no murmurs, no rubs, no gallops  Respiratory: Clear to auscultation bilaterally, no wheezing, bibasilar crackles   Abdomen: Soft, tender in epigastric area, non distended, bowel sounds present, no guarding  Neuro: Grossly nonfocal  Data Reviewed: Basic Metabolic Panel:  Recent Labs Lab 08/26/13 0443 08/27/13 0500 08/28/13 0750 08/29/13 0457 08/30/13 0504  NA 139 139 139 139 140  K 3.8 3.7 3.8 3.8 3.5*  CL 100 100 99 102 102  CO2 28 27 26 27 27   GLUCOSE 102* 100* 108* 125* 129*  BUN 17 18 20 18 15   CREATININE 1.35* 1.14* 1.03 0.95 1.03  CALCIUM 8.2* 8.4 8.6 8.3* 8.4  MG  --   --   --   --  1.5   Liver Function Tests: No results found for this basename: AST, ALT, ALKPHOS, BILITOT, PROT, ALBUMIN,  in the last 168 hours CBC:  Recent Labs Lab 08/25/13 0445 08/30/13 0504  WBC 10.9* 9.6  HGB 8.8* 8.6*  HCT 27.8* 27.1*  MCV 83.5 82.9  PLT 337 296   CBG:  Recent Labs Lab 08/29/13 2351 08/30/13 0432 08/30/13 0814 08/30/13 1220 08/30/13 1635  GLUCAP 115* 125* 105* 165* 218*    Scheduled Meds: . antiseptic oral rinse  15 mL Mouth Rinse q12n4p  . chlorhexidine  15 mL Mouth Rinse BID  . feeding supplement (RESOURCE BREEZE)  1 Container Oral TID BM  . insulin aspart  0-15 Units Subcutaneous 6 times per day  . lidocaine  1 patch Transdermal Q24H  . metoCLOPramide (REGLAN) injection  2.5 mg Intravenous Q12H  . mometasone-formoterol  2 puff Inhalation BID  . nicotine  14 mg Transdermal Daily  . sodium phosphate  1 enema Rectal Once   Continuous Infusions: . sodium chloride 30 mL/hr at 08/29/13 1918   Krystal Isaiah BlakesY Hernandez Acosta, [name]  [facility] Pager [phone]  If 7PM-7AM, please contact  night-coverage [email or web address] Password [email or web address] 08/30/2013, 5:41 PM   LOS: 8 days

## 2013-08-30 NOTE — Progress Notes (Signed)
Subjective: NG came out last PM, she had 3 Bm's and minimal clears recorded.  She has no idea where she is or why she is here this AM.  Doesn't know year or month.   Dr. Andrey CampanileWilson is concerned about her swallowing, these small episodes of emesis may be more related to trouble swallowing.  She is also not being mobilized and we need to get her up and moving. Objective: Vital signs in last 24 hours: Temp:  [98.2 F (36.8 C)-99.6 F (37.6 C)] 98.5 F (36.9 C) (04/14 0530) Pulse Rate:  [85-92] 92 (04/14 0530) Resp:  [16-18] 18 (04/14 0530) BP: (138-148)/(74-86) 138/74 mmHg (04/14 0530) SpO2:  [92 %-93 %] 93 % (04/14 0530) Last BM Date: 08/29/13 145 PO recorded  Diet: clears 3 stools recorded Afebrile,VSS K+ 3.5 Anemic, with stable H/H INR 1.94 Intake/Output from previous day: 04/13 0701 - 04/14 0700 In: 1033.3 [P.O.:145; I.V.:858.3; NG/GT:30] Out: 1050 [Urine:1050] Intake/Output this shift:    General appearance: alert, cooperative, no distress and confused, but very plesant. GI: soft, non-tender; bowel sounds normal; no masses,  no organomegaly and suprapubic catheter in place.  Lab Results:   Recent Labs  08/30/13 0504  WBC 9.6  HGB 8.6*  HCT 27.1*  PLT 296    BMET  Recent Labs  08/29/13 0457 08/30/13 0504  NA 139 140  K 3.8 3.5*  CL 102 102  CO2 27 27  GLUCOSE 125* 129*  BUN 18 15  CREATININE 0.95 1.03  CALCIUM 8.3* 8.4   PT/INR  Recent Labs  08/29/13 0457 08/30/13 0504  LABPROT 21.3* 21.6*  INR 1.91* 1.94*    No results found for this basename: AST, ALT, ALKPHOS, BILITOT, PROT, ALBUMIN,  in the last 168 hours   Lipase     Component Value Date/Time   LIPASE 18 08/15/2013 1205     Studies/Results: Dg Chest Port 1 View  08/29/2013   CLINICAL DATA:  Shortness of breath.  EXAM: PORTABLE CHEST - 1 VIEW  COMPARISON:  08/22/2013  FINDINGS: There is a nasogastric tube with tip below the field of view. Mild cardiac enlargement is noted. There is no  pleural effusion or edema identified. Chronic pleural and parenchymal scarring in the periphery of the left base is again noted. No airspace consolidation.  IMPRESSION: 1. No acute cardiopulmonary abnormalities.   Electronically Signed   By: Signa Kellaylor  Stroud M.D.   On: 08/29/2013 15:57   Dg Abd 2 Views  08/29/2013   CLINICAL DATA:  Week.  Generalized Malaise  EXAM: ABDOMEN - 2 VIEW  COMPARISON:  08/27/2013  FINDINGS: Mild dilation of small bowel is again noted with scattered air-fluid levels on the decubitus view. The degree of small bowel dilation is without substantial change. Air is seen within a non distended colon. There is no free air. Nasogastric tube is stable and well positioned.  IMPRESSION: 1. No significant change. 2. Mild small bowel dilation with scattered air-fluid levels supports a low grade small bowel obstruction. No free air.   Electronically Signed   By: Amie Portlandavid  Ormond M.D.   On: 08/29/2013 08:12    Medications: . antiseptic oral rinse  15 mL Mouth Rinse q12n4p  . bisacodyl  10 mg Rectal Daily  . chlorhexidine  15 mL Mouth Rinse BID  . insulin aspart  0-15 Units Subcutaneous 6 times per day  . lidocaine  1 patch Transdermal Q24H  . metoCLOPramide (REGLAN) injection  2.5 mg Intravenous Q12H  . mometasone-formoterol  2 puff Inhalation  BID  . nicotine  14 mg Transdermal Daily  . polyethylene glycol  17 g Oral BID  . sodium phosphate  1 enema Rectal Once   Prior to Admission medications   Medication Sig Start Date End Date Taking? Authorizing Provider  acetaminophen (TYLENOL) 500 MG tablet Take 500-1,000 mg by mouth every 6 (six) hours as needed (for pain).   Yes Historical Provider, MD  ALPRAZolam (XANAX) 0.25 MG tablet Take 0.125-0.25 mg by mouth 2 (two) times daily as needed for sleep or anxiety (0.5-1 tablet).    Yes Historical Provider, MD  cetirizine (ZYRTEC) 10 MG chewable tablet Chew 10 mg by mouth daily.   Yes Historical Provider, MD  ciprofloxacin (CIPRO) 500 MG tablet  Take 500 mg by mouth daily. 10 day therapy course patient began on 08/19/2013 08/19/13  Yes Clydia Llano, MD  diltiazem (CARDIZEM) 120 MG tablet Take 120 mg by mouth 2 (two) times daily.   Yes Historical Provider, MD  ferrous sulfate 325 (65 FE) MG tablet Take 325 mg by mouth at bedtime.    Yes Historical Provider, MD  Fluticasone-Salmeterol (ADVAIR) 250-50 MCG/DOSE AEPB Inhale 1 puff into the lungs 2 (two) times daily as needed (shortness of breath).    Yes Historical Provider, MD  furosemide (LASIX) 40 MG tablet Take 60 mg by mouth daily. Take 1.5 tablets 09/27/12  Yes Tonny Bollman, MD  insulin glargine (LANTUS) 100 UNIT/ML injection Inject 0.45 mLs (45 Units total) into the skin at bedtime. 03/03/13  Yes Marinda Elk, MD  lidocaine (LIDODERM) 5 % Place 1 patch onto the skin daily. Applied to back. Remove & Discard patch within 12 hours or as directed by MD   Yes Historical Provider, MD  losartan (COZAAR) 100 MG tablet Take 100 mg by mouth daily.   Yes Historical Provider, MD  methimazole (TAPAZOLE) 10 MG tablet Take 5 mg by mouth daily.   Yes Historical Provider, MD  metoCLOPramide (REGLAN) 5 MG tablet Take 2.5 mg by mouth 2 (two) times daily.    Yes Historical Provider, MD  mirabegron ER (MYRBETRIQ) 25 MG TB24 tablet Take 25 mg by mouth daily.   Yes Historical Provider, MD  mirtazapine (REMERON) 15 MG tablet Take 15 mg by mouth at bedtime.   Yes Historical Provider, MD  Polyethyl Glycol-Propyl Glycol (SYSTANE OP) Apply 1 drop to eye daily as needed (dry eye).   Yes Historical Provider, MD  potassium chloride SA (K-DUR,KLOR-CON) 20 MEQ tablet Take 10 mEq by mouth 2 (two) times daily.   Yes Historical Provider, MD  pravastatin (PRAVACHOL) 20 MG tablet Take 20 mg by mouth daily.    Yes Historical Provider, MD  sitaGLIPtin (JANUVIA) 50 MG tablet Take 50 mg by mouth daily.   Yes Historical Provider, MD  vitamin B-12 (CYANOCOBALAMIN) 1000 MCG tablet Take 1,000 mcg by mouth daily.   Yes Historical  Provider, MD  warfarin (COUMADIN) 2 MG tablet Take 2-3 mg by mouth daily. 2 mg on sun, tue, thur and 3 mg all other days   Yes Historical Provider, MD     Assessment/Plan Abdominal pain/Abdominal distension  Constipation vs Early pSBO  Dementia Hypertension Hyperthyroid Chronic kidney disease StageIV AODM AF/hx of PE on coumadin at home Suprapubic catheter Body mass index is 42.03 kg/(m^2). Hypokalemia   Plan:  She should be up walking and should be seen by OT and PT, if she has any further emesis after trying liquids, I would get ST to do an evaluation of her swallow. I  would leave her on liquids and if she does well with clears she could advance to full liquids later today or in the AM. I would also like for her K+ to be up around 4.0.    LOS: 8 days    Sherrie GeorgeWillard Cevin Rubinstein 08/30/2013

## 2013-08-30 NOTE — Progress Notes (Addendum)
Dr. Johna SheriffHoxworth notified of conversation with Merdis DelayK Schorr NP, discussed patient situation. Orders received to leave NG tube out. [facility] made aware of order. Scarlette SliceHannah Alizea Pell, Nursing Student/[facility] RN

## 2013-08-30 NOTE — Progress Notes (Signed)
Pt ambulating in halls with PT Therefore exam deferred  Discussed with PA - see his plan  Mary Sellaric M. Andrey CampanileWilson, MD, FACS General, Bariatric, & Minimally Invasive Surgery [facility], [place]

## 2013-08-30 NOTE — Evaluation (Signed)
Physical Therapy Evaluation Patient Details Name: Krystal [name] Quickel MRN: [id number] DOB: 1924/06/05 Today's Date: 08/30/2013   History of Present Illness  Pt is an 80 year old female recently discharged after hospitalization for UTI, readmitted [facility]  [facility] with worsening mental status (per chart related to UTI or progressing dementia?) changed during prior day, received conservative tx for PBO, NG tube removed; PMH of HTN, a-fib, DVT and PE on coumadin, diabetes  Clinical Impression  Pt admitted with worsening mental status and SBO.  Pt currently with functional limitations due to the deficits listed below (see PT Problem List).  Pt concerned about strength for transfers and ambulation.  Pt ambulated in hallway according to tolerance.  Family member present reports pt does not have adequate level of care at home.  Pt will benefit from skilled PT to increase their independence and safety with mobility to allow discharge.    Follow Up Recommendations SNF    Equipment Recommendations  None recommended by PT    Recommendations for Other Services       Precautions / Restrictions Precautions Precautions: Fall Restrictions Weight Bearing Restrictions: No      Mobility  Bed Mobility Overal bed mobility: Needs Assistance Bed Mobility: Supine to Sit;Sit to Supine     Supine to sit: Min assist;HOB elevated Sit to supine: Mod assist   General bed mobility comments: verbal cues for safety; required therapist hand and posterior support to come into upright sitting; required therapist assist of LE following ambulation to return to supine  Transfers Overall transfer level: Needs assistance Equipment used: Rolling walker (2 wheeled) Transfers: Sit to/from Stand Sit to Stand: Min assist         General transfer comment: verbal cues for safety and hand placement; pt concerned about having strength to stand up but able with little  assist  Ambulation/Gait Ambulation/Gait assistance: Min guard Ambulation Distance (Feet): 80 Feet Assistive device: Rolling walker (2 wheeled) Gait Pattern/deviations: Decreased stride length;Step-through pattern;Decreased step length - right Gait velocity: decr   General Gait Details: pt repeated that she did not want to go to far, wanted to self-determine distance ambulated, complained that her R LE did not work as well  Information systems managertairs            Wheelchair Mobility    Modified Rankin (Stroke Patients Only)       Balance Overall balance assessment: Needs assistance Sitting-balance support: Bilateral upper extremity supported;Feet supported Sitting balance-Leahy Scale: Fair     Standing balance support: During functional activity;Bilateral upper extremity supported Standing balance-Leahy Scale: Fair                               Pertinent Vitals/Pain Activity to tolerance.  No complaints of pain.    Home Living Family/patient expects to be discharged to:: Private residence [facility]; Independent living facility) Living Arrangements: Alone Available Help at Discharge: Family;Personal care attendant;Available PRN/intermittently (attendant around a few hours per day) Type of Home: Apartment Home Access: Elevator;Stairs to enter Entrance Stairs-Rails: Right Entrance Stairs-Number of Steps: 2 flights Home Layout: One level Home Equipment: Walker - 2 wheels;Tub bench;Bedside commode      Prior Function Level of Independence: Needs assistance   Gait / Transfers Assistance Needed: personal care attendant available a few hours each day  ADL's / Homemaking Assistance Needed: family brings pre-made food or cook at her apartment for her; does not do her own  cleaning or shopping  Comments: pt does not drive; family member present, believes pt does not have enought support at home and needs higher level care     Hand Dominance         Extremity/Trunk Assessment   Upper Extremity Assessment: Generalized weakness           Lower Extremity Assessment: Generalized weakness      Cervical / Trunk Assessment: Kyphotic  Communication   Communication: HOH  Cognition Arousal/Alertness: Awake/alert Behavior During Therapy: WFL for tasks assessed/performed Overall Cognitive Status: Within Functional Limits for tasks assessed                 General Comments: pt cognition WFL for tasks however repeated information about home DME multiple times throughout treatment    General Comments      Exercises        Assessment/Plan    PT Assessment Patient needs continued PT services  PT Diagnosis Difficulty walking;Generalized weakness   PT Problem List Decreased strength;Decreased activity tolerance;Decreased balance;Decreased mobility;Decreased knowledge of use of DME;Decreased safety awareness  PT Treatment Interventions DME instruction;Gait training;Stair training;Functional mobility training;Balance training;Therapeutic exercise;Therapeutic activities;Patient/family education   PT Goals (Current goals can be found in the Care Plan section) Acute Rehab PT Goals Patient Stated Goal: pt did not state PT Goal Formulation: With patient/family Time For Goal Achievement: 09/06/13 Potential to Achieve Goals: Fair    Frequency Min 3X/week   Barriers to discharge Decreased caregiver support family member present, says pt needs higher level of care and support than available at her independent living facility    Co-evaluation               End of Session Equipment Utilized During Treatment: Gait belt Activity Tolerance: Patient tolerated treatment well Patient left: in bed;with call bell/phone within reach;with family/visitor present (social worker entered room upon leaving)           Time: [id number] PT Time Calculation (min): 21 min   Charges:   PT Evaluation $Initial PT Evaluation Tier I: 1  Procedure PT Treatments $Gait Training: 8-22 mins   PT G CodesBufford Lope:          Wyley Hack 08/30/2013, 4:19 PM Bufford LopeLaura Leolia Vinzant SPT 08/30/2013

## 2013-08-30 NOTE — Evaluation (Signed)
I have reviewed this note and agree with all findings. Kati Laylana Gerwig, PT, DPT Pager: [phone]   

## 2013-08-30 NOTE — Progress Notes (Signed)
Verbal order from MD Ardyth HarpsHernandez that dulcolax suppository and miralax may be discontinued due to several loose stools a day, cbg ordered from ac and hs now that patient is eating Krystal BreedKennitrish N Maribell Demeo RN 08-30-2013 12:27pm

## 2013-08-31 DIAGNOSIS — R112 Nausea with vomiting, unspecified: Secondary | ICD-10-CM

## 2013-08-31 DIAGNOSIS — IMO0001 Reserved for inherently not codable concepts without codable children: Secondary | ICD-10-CM

## 2013-08-31 DIAGNOSIS — I10 Essential (primary) hypertension: Secondary | ICD-10-CM

## 2013-08-31 DIAGNOSIS — E1165 Type 2 diabetes mellitus with hyperglycemia: Secondary | ICD-10-CM

## 2013-08-31 LAB — GLUCOSE, CAPILLARY
GLUCOSE-CAPILLARY: 132 mg/dL — AB (ref 70–99)
GLUCOSE-CAPILLARY: 270 mg/dL — AB (ref 70–99)
Glucose-Capillary: 89 mg/dL (ref 70–99)
Glucose-Capillary: 204 mg/dL — ABNORMAL HIGH (ref 70–99)
Glucose-Capillary: 100 mg/dL — ABNORMAL HIGH (ref 70–99)

## 2013-08-31 LAB — BASIC METABOLIC PANEL
BUN: 15 mg/dL (ref 6–23)
CALCIUM: 8.1 mg/dL — AB (ref 8.4–10.5)
CHLORIDE: 104 meq/L (ref 96–112)
CO2: 25 mEq/L (ref 19–32)
CREATININE: 1.03 mg/dL (ref 0.50–1.10)
GFR calc Af Amer: 55 mL/min — ABNORMAL LOW (ref >90)
GFR calc non Af Amer: 47 mL/min — ABNORMAL LOW (ref >90)
GLUCOSE: 108 mg/dL — AB (ref 70–99)
Potassium: 3.6 mEq/L — ABNORMAL LOW (ref 3.7–5.3)
Sodium: 140 mEq/L (ref 137–147)

## 2013-08-31 LAB — PROTIME-INR
INR: 1.99 — AB (ref 0.00–1.49)
Prothrombin Time: 22 seconds — ABNORMAL HIGH (ref 11.6–15.2)

## 2013-08-31 MED ORDER — INSULIN ASPART 100 UNIT/ML ~~LOC~~ SOLN: 0.0000 [IU] | Freq: Every day | SUBCUTANEOUS | Status: DC

## 2013-08-31 MED ORDER — INSULIN ASPART 100 UNIT/ML ~~LOC~~ SOLN
0.0000 [IU] | Freq: Three times a day (TID) | SUBCUTANEOUS | Status: DC
  Administered 2013-09-01: 2 [IU] via SUBCUTANEOUS
  Administered 2013-09-01: 3 [IU] via SUBCUTANEOUS

## 2013-08-31 MED ORDER — DILTIAZEM HCL ER COATED BEADS 120 MG PO CP24
120.0000 mg | ORAL_CAPSULE | Freq: Every day | ORAL | Status: DC
  Administered 2013-08-31: 120 mg via ORAL
  Filled 2013-08-31 (×2): qty 1

## 2013-08-31 MED ORDER — SENNOSIDES-DOCUSATE SODIUM 8.6-50 MG PO TABS
1.0000 | ORAL_TABLET | Freq: Two times a day (BID) | ORAL | Status: DC
  Administered 2013-08-31 – 2013-09-01 (×3): 1 via ORAL
  Filled 2013-08-31 (×4): qty 1

## 2013-08-31 NOTE — Progress Notes (Signed)
Subjective: Pt very alert, tolerating clears today.  Patient states she sometimes has trouble swallowing solids/liquids from time to time.  No abdominal pain N/V currently.  About to eat breakfast.  OT worked with patient and got her to the chair.  PT walked her in the hallway yesterday.    Objective: Vital signs in last 24 hours: Temp:  [97.3 F (36.3 C)-98.3 F (36.8 C)] 97.8 F (36.6 C) (04/15 0534) Pulse Rate:  [87-97] 97 (04/15 0534) Resp:  [16-18] 18 (04/15 0534) BP: (117-132)/(52-67) 129/64 mmHg (04/15 0534) SpO2:  [92 %-97 %] 92 % (04/15 0534) Last BM Date: 08/29/13  Intake/Output from previous day: 04/14 0701 - 04/15 0700 In: 1120 [I.V.:720; IV Piggyback:400] Out: 900 [Urine:900] Intake/Output this shift:    PE: Gen:  Alert, NAD, pleasant Abd: Soft, NT/ND, +BS, no HSM,  abdominal scars noted   Lab Results:   Recent Labs  08/30/13 0504  WBC 9.6  HGB 8.6*  HCT 27.1*  PLT 296   BMET  Recent Labs  08/30/13 0504 08/31/13 0500  NA 140 140  K 3.5* 3.6*  CL 102 104  CO2 27 25  GLUCOSE 129* 108*  BUN 15 15  CREATININE 1.03 1.03  CALCIUM 8.4 8.1*   PT/INR  Recent Labs  08/30/13 0504 08/31/13 0500  LABPROT 21.6* 22.0*  INR 1.94* 1.99*   CMP     Component Value Date/Time   NA 140 08/31/2013 0500   K 3.6* 08/31/2013 0500   CL 104 08/31/2013 0500   CO2 25 08/31/2013 0500   GLUCOSE 108* 08/31/2013 0500   BUN 15 08/31/2013 0500   CREATININE 1.03 08/31/2013 0500   CALCIUM 8.1* 08/31/2013 0500   PROT 6.6 08/23/2013 0417   ALBUMIN 2.9* 08/23/2013 0417   AST 22 08/23/2013 0417   ALT 13 08/23/2013 0417   ALKPHOS 99 08/23/2013 0417   BILITOT 0.4 08/23/2013 0417   GFRNONAA 47* 08/31/2013 0500   GFRAA 55* 08/31/2013 0500   Lipase     Component Value Date/Time   LIPASE 18 08/15/2013 1205       Studies/Results: Dg Chest Port 1 View  08/29/2013   CLINICAL DATA:  Shortness of breath.  EXAM: PORTABLE CHEST - 1 VIEW  COMPARISON:  08/22/2013  FINDINGS: There is a  nasogastric tube with tip below the field of view. Mild cardiac enlargement is noted. There is no pleural effusion or edema identified. Chronic pleural and parenchymal scarring in the periphery of the left base is again noted. No airspace consolidation.  IMPRESSION: 1. No acute cardiopulmonary abnormalities.   Electronically Signed   By: Signa Kellaylor  Stroud M.D.   On: 08/29/2013 15:57   Dg Abd 2 Views  08/29/2013   CLINICAL DATA:  Week.  Generalized Malaise  EXAM: ABDOMEN - 2 VIEW  COMPARISON:  08/27/2013  FINDINGS: Mild dilation of small bowel is again noted with scattered air-fluid levels on the decubitus view. The degree of small bowel dilation is without substantial change. Air is seen within a non distended colon. There is no free air. Nasogastric tube is stable and well positioned.  IMPRESSION: 1. No significant change. 2. Mild small bowel dilation with scattered air-fluid levels supports a low grade small bowel obstruction. No free air.   Electronically Signed   By: Amie Portlandavid  Ormond M.D.   On: 08/29/2013 08:12    Anti-infectives: Anti-infectives   Start     Dose/Rate Route Frequency Ordered Stop   08/23/13 1600  piperacillin-tazobactam (ZOSYN) IVPB 3.375 g  Status:  Discontinued     3.375 g 12.5 mL/hr over 240 Minutes Intravenous 3 times per day 08/23/13 0638 08/25/13 1807   08/22/13 1800  piperacillin-tazobactam (ZOSYN) IVPB 3.375 g  Status:  Discontinued     3.375 g 12.5 mL/hr over 240 Minutes Intravenous 3 times per day 08/22/13 1743 08/23/13 0638   08/22/13 1700  doxycycline (VIBRAMYCIN) 100 mg in dextrose 5 % 250 mL IVPB  Status:  Discontinued     100 mg 125 mL/hr over 120 Minutes Intravenous Every 12 hours 08/22/13 1603 08/26/13 1224       Assessment/Plan Abdominal pain  Abdominal distension  Constipation ? Early pSBO  Nausea/vomiting  Plan:  1. Had good BM's yesterday 2. NG out and tolerating clears, pain control, antiemetics, cont enemas/dulcolax  3. SCD's, on warfarin  4.  Needs to ambulate more 5. Tolerating clear liquids 6.  Asked SLP to eval swallowing - maybe that is contributing to her N/V as opposed to having an obstruction which she does not appear to currently have.    LOS: 9 days    Aris GeorgiaMegan Dort 08/31/2013, 7:56 AM Pager: [phone]

## 2013-08-31 NOTE — Progress Notes (Signed)
Patient is alert, follows commands appropriately, oriented to person, place, situation and disoriented to time. Her vital signs are stable. Patient is now tolerating clear liquid diet with no complains of nausea or vomiting. Physical therapist assisted patient out of bed into a chair and patient is tolerating very well. Patient stated prefers to sit in the chair. Speech therapist saw patient today and patient seems to swallow well with no problems Patient looks much happier today and shows no signs of distress. Krystal [name] (Student Nurse). 08/31/13. 1800

## 2013-08-31 NOTE — Progress Notes (Signed)
[facility] PROGRESS NOTE  Krystal SmokerLouise P [name] ZOX:[id number] DOB: 11-Feb-1925 DOA: 08/22/2013 PCP: Krystal HillierLITTLE,[name], MD  Assessment/Plan: 80 year old female with past medical history of hypertension, atrial fibrillation, DVT and PE on coumadin, diabetes, recent hospitalization for UTI due to serratia (was supposed to be on cipro for 10 days after discharge 4/2) who [facility] with worsening mental status changed in past 24 hours prior to this admission. Pt is not a good historian as she is lethargic at this time. She was given morphine for pain in ED. Patient was apparently (per her daughters) hallucinating and thinking someone has robbed her house and then she did not know where she was and could not recognize her daughters.  In ED, vitals are stable. Blood work revealed WBC count 10.9, hemoglobin 10.2, creatinine 1.94. Abdominal x ray showed new distention of small bowel loops possible small bowel obstruction. This was also seen on CT abdomen. Repeat abd film 4/8, with worsened SBO. Will place NG tube. Persistent dilated small bowel loops 4/9 despite NG suction. Now tolerating clears, NG out.    1. Acute encephalopathy  Possible progressive dementia but also UTI. Cx from 3/30 with serratia. Also wonder if SBO is playing a role in her confusion. Discontinue all antibiotics at this point. She is more alert today than in days prior. 4/12: much improved clinically; following commands and can carry a conversation. Reports feeling better this AM. Completely resolved 4/14.  2. SBO (small bowel obstruction)  -Clinically improving. NG out and tolerating clears. Pend SLP per surgery   3. Diabetes mellitus type 2, uncontrolled  We will hold off on by mouth medications until small bowel obstruction resolves. Until then we will use a sliding scale insulin. Reasonable inpatient control.  4. Atrial fibrillation  Rate controlled. Will DC coumadin at this time given NG to suction. May consider  restarting soon as patient is improving. Resume Cardizem  5. History of pulmonary embolism  Off coumadin for now  6. HTN (hypertension)  Consider restarting home meds soon if BP elevated and bowel function returns.  7. Leukocytosis, unspecified  Secondary to UTI/SBO  8. Anemia of chronic disease  Secondary to history of CKD. Hemoglobin drop on admission 9.4 --> 8.8, no signs of overt bleeding. Repeat CBC in AM.  9. Chronic kidney disease (CKD), stage IV (severe)  Creatinine 2.11 on 08/17/2013 and on this admission 1.9. Creatinine is now WNL.  10. Moderate malnutrition  Secondary to acute illness, keep NPO for now and try clears today.   D/c plans per surgery   Code Status: DNR Family Communication: d/w patient, her sister  (indicate person spoken with, relationship, and if by phone, the number) Disposition Plan: SNF    Consultants:  Surgery   Procedures:  LE bilateral venous doppler - no evidence of DVT (08/22/2013)  Dg Abd 2 Views 08/29/2013 No significant change, mild small bowel dilation with scattered air-fluid levels supports a low grade SBO. No free air.  Antibiotics:  Zosyn 08/22/2013 -->  Doxycycline 08/22/2013 -->   HPI/Subjective: alert  Objective: Filed Vitals:   08/31/13 0534  BP: 129/64  Pulse: 97  Temp: 97.8 F (36.6 C)  Resp: 18    Intake/Output Summary (Last 24 hours) at 08/31/13 1322 Last data filed at 08/31/13 1000  Gross per 24 hour  Intake   1380 ml  Output   1500 ml  Net   -120 ml   Filed Weights   08/25/13 0500 08/28/13 0500 08/29/13 0500  Weight: 106 kg (233 lb 11 oz) 97.5 kg (214 lb 15.2 oz) 97.1 kg (214 lb 1.1 oz)    Exam:   General:  Alert,   Cardiovascular: s1,s2 rrr  Respiratory: CTA BL  Abdomen: soft,nt,nd   Musculoskeletal: no LE edema   Data Reviewed: Basic Metabolic Panel:  Recent Labs Lab 08/27/13 0500 08/28/13 0750 08/29/13 0457 08/30/13 0504 08/31/13 0500  NA 139 139 139 140 140  K 3.7 3.8 3.8 3.5* 3.6*   CL 100 99 102 102 104  CO2 27 26 27 27 25   GLUCOSE 100* 108* 125* 129* 108*  BUN 18 20 18 15 15   CREATININE 1.14* 1.03 0.95 1.03 1.03  CALCIUM 8.4 8.6 8.3* 8.4 8.1*  MG  --   --   --  1.5  --    Liver Function Tests: No results found for this basename: AST, ALT, ALKPHOS, BILITOT, PROT, ALBUMIN,  in the last 168 hours No results found for this basename: LIPASE, AMYLASE,  in the last 168 hours No results found for this basename: AMMONIA,  in the last 168 hours CBC:  Recent Labs Lab 08/25/13 0445 08/30/13 0504  WBC 10.9* 9.6  HGB 8.8* 8.6*  HCT 27.8* 27.1*  MCV 83.5 82.9  PLT 337 296   Cardiac Enzymes: No results found for this basename: CKTOTAL, CKMB, CKMBINDEX, TROPONINI,  in the last 168 hours BNP (last 3 results)  Recent Labs  02/28/13 2030 08/15/13 2235  PROBNP 478.4* 284.1   CBG:  Recent Labs Lab 08/30/13 1933 08/30/13 2317 08/31/13 0333 08/31/13 0743 08/31/13 1155  GLUCAP 170* 153* 89 132* 270*    No results found for this or any previous visit (from the past 240 hour(s)).   Studies: No results found.  Scheduled Meds: . antiseptic oral rinse  15 mL Mouth Rinse q12n4p  . chlorhexidine  15 mL Mouth Rinse BID  . feeding supplement (RESOURCE BREEZE)  1 Container Oral TID BM  . insulin aspart  0-15 Units Subcutaneous 6 times per day  . lidocaine  1 patch Transdermal Q24H  . metoCLOPramide (REGLAN) injection  2.5 mg Intravenous Q12H  . mometasone-formoterol  2 puff Inhalation BID  . nicotine  14 mg Transdermal Daily  . sodium phosphate  1 enema Rectal Once   Continuous Infusions: . sodium chloride 30 mL/hr (08/31/13 1231)    Principal Problem:   Acute encephalopathy Active Problems:   CAD (coronary artery disease)   Diabetes mellitus type 2, uncontrolled   Lower extremity edema   Atrial fibrillation   History of pulmonary embolism   HTN (hypertension)   SBO (small bowel obstruction)   Leukocytosis, unspecified   Anemia of chronic disease    Chronic kidney disease (CKD), stage IV (severe)   UTI (urinary tract infection)    Time spent: >35 minutes     [name]  [facility] Pager [phone]. If 7PM-7AM, please contact night-coverage at [email or web address], password [email or web address] 08/31/2013, 1:22 PM  LOS: 9 days

## 2013-08-31 NOTE — Progress Notes (Signed)
If no issues on speech assessment/swallow - can advance diet as tolerated  Mary SellaEric M. Andrey CampanileWilson, MD, FACS General, Bariatric, & Minimally Invasive Surgery [facility], [place]

## 2013-08-31 NOTE — Evaluation (Signed)
Clinical/Bedside Swallow Evaluation Patient Details  Name: Krystal [name] MRN: [id number] Date of Birth: 1924-12-09  Today's Date: 08/31/2013 Time: [id number] SLP Time Calculation (min): 21 min  Past Medical History:  Past Medical History  Diagnosis Date  . Hypertension   . GERD (gastroesophageal reflux disease)   . Diabetes mellitus   . History of atrial fibrillation EPISODE YRS AGO  . Urinary retention     s/p suprapubic catheter November 2013  . Coronary artery disease cardiologist- dr Maylon Costennent--  lov 09-12-2010 in epic    non-obstructinve min. cad  . Foley catheter in place   . Edema of lower extremity BILATERAL LEG  . Hyperlipidemia   . History of pulmonary embolus (PE) 2010--  TAKES COUMADIN  . History of DVT of lower extremity   . Anticoagulated on Coumadin   . Generalized weakness AGE RELATED--  USES WALKER  . Vertigo   . OA (osteoarthritis)   . Thin skin FRAGILE  . Hypothyroidism NO MEDS  . Impaired hearing BILATERAL AIDS  . Chronic diarrhea INTERMITTANT   Past Surgical History:  Past Surgical History  Procedure Laterality Date  . Knee surgery    . Tubal ligation    . Cardiac catheterization  09-27-2004  DR TENNENT    NORMAL LVF/ MILD IRREGULARITIES IN MID LAD W/ NO SIGNIFICANT OBSTRUCTIVE DISEASE  . Abdominal hysterectomy  1994  (APPROX)    W/ BILATERAL SALPINGO-OOPHORECTOMY  . Cholecystectomy  2000  . Knee arthroscopy  1998  (APPROX)  . Bladder suspension  2002  (APPROX)    W/ ANTERIOR AND POSTERIOR REPAIR  . Cataract extraction w/ intraocular lens  implant, bilateral    . Transthoracic echocardiogram  02-23-2009  (DX W/ PE & PAF)    EF 65-70%/ MILD MR/ MODERATE TR/ MILDLY DILATED RA  &  LA/  RV SYSTOLIC FUNCTION MODERATLY REDUCED   . Insertion of suprapubic catheter  04/02/2012    Procedure: INSERTION OF SUPRAPUBIC CATHETER;  Surgeon: Garnett FarmMark C Ottelin, MD;  [facility];  Service: Urology;  Laterality: N/A;  SUPRAPUBIC TUBE PLACEMENT  .  Cystoscopy  04/02/2012    Procedure: CYSTOSCOPY;  Surgeon: Garnett FarmMark C Ottelin, MD;  [facility];  Service: Urology;;   HPI:  80 year old female recently discharged after hospitalization for UTI, read[facility] with worsening mental status (per chart related to UTI or progressing dementia?).   Pt received conservative tx for PBO, NG tube removed and she has started clears and tolerating well.  PMH of HTN, a-fib, DVT and PE on coumadin, diabetes.   PA ordered BSE due to pt report of dysphagia to PA and ongoing n/v per order.     Assessment / Plan / Recommendation Clinical Impression  Pt without indication of dysphagia nor nausea with po observed.  Pt without significant focal CN deficits and she currently denies any issues with swallowing nor nausea.  Pt did not recall informing surgical PA of problems swallowing.  SLP questions if pt had transient dysphagia due to her discomfort from NG placement that has resolved.    Pt repeatedly stated that she "eats slow" during evaluation and was observed to do so with intake.    Recommend to advance diet as MD indicates - SLP spoke to RN re: findings and will sign off.  Thanks for this referral.      Aspiration Risk  Mild due to cognitive deficit and GERD   Diet Recommendation Thin liquid (  oropharyngeal swallow ability - regular/thin)   Liquid Administration via: Cup;Straw Medication Administration: Whole meds with liquid Supervision: Patient able to self feed Compensations: Slow rate;Small sips/bites Postural Changes and/or Swallow Maneuvers: Seated upright 90 degrees;Upright 30-60 min after meal    Other  Recommendations Oral Care Recommendations: Oral care BID   Follow Up Recommendations  None    Frequency and Duration     n/a   Pertinent Vitals/Pain Afebrile, decreased     Swallow Study Prior Functional Status   see HHX    General Date of Onset: 08/31/13 HPI: 80 year old female  recently discharged after hospitalization for UTI, [facility] with worsening mental status (per chart related to UTI or progressing dementia?).   Pt received conservative tx for PBO, NG tube removed and she has started clears and tolerating well.  PMH of HTN, a-fib, DVT and PE on coumadin, diabetes.   PA ordered BSE due to pt report of dysphagia to PA and ongoing n/v per order.   Type of Study: Bedside swallow evaluation Diet Prior to this Study: Thin liquids (clears) Temperature Spikes Noted: No Respiratory Status: Room air History of Recent Intubation: No Behavior/Cognition: Alert;Cooperative Oral Cavity - Dentition: Dentures, top (ill fitting but pt desires to wear them without adhesive) Self-Feeding Abilities: Able to feed self Patient Positioning: Upright in bed Baseline Vocal Quality: Clear Volitional Cough: Strong Volitional Swallow: Able to elicit    Oral/Motor/Sensory Function Overall Oral Motor/Sensory Function: Appears within functional limits for tasks assessed (? mildly decreased facial movement on right and slight lingual deviation to left upon protrusion, no significant CN deficit)   Ice Chips Ice chips: Not tested   Thin Liquid Thin Liquid: Within functional limits Presentation: Self Fed;Straw    Nectar Thick Nectar Thick Liquid: Not tested   Honey Thick Honey Thick Liquid: Not tested   Puree Puree: Within functional limits Presentation: Spoon Other Comments: 3 bites of applesauce   Solid   GO    Solid: Within functional limits Presentation: Self Fed Other Comments: single bite of cracker       Donavan Burnetamara Sanaii Caporaso, MS [facility] SLP [phone]

## 2013-08-31 NOTE — Progress Notes (Signed)
Telephone order that patient's insulin correction scale may changed to ac and hs now that patient is eating Stanford BreedKennitrish N Aoki Wedemeyer RN 08-31-2013 17:34pm

## 2013-08-31 NOTE — Evaluation (Signed)
Occupational Therapy Evaluation Patient Details Name: Krystal [name] MRN: [id number] DOB: Apr 30, 1925 Today's Date: 08/31/2013    History of Present Illness Pt is an 80 year old female recently discharged after hospitalization for UTI, readmitted [facility] with worsening mental status (per chart related to UTI or progressing dementia?) changed during prior day, received conservative tx for PBO, NG tube removed; PMH of HTN, a-fib, DVT and PE on coumadin, diabetes   Clinical Impression   Pt was admitted for the above conditions.  She will benefit from skilled OT to increase safety and independence with adls with min guard to supervision level goals.  PLOF unknown; pt had caregiver several hours per day.      Follow Up Recommendations  SNF    Equipment Recommendations  3 in 1 bedside comode    Recommendations for Other Services       Precautions / Restrictions Precautions Precautions: Fall Restrictions Weight Bearing Restrictions: No      Mobility Bed Mobility         Supine to sit: Mod assist;HOB elevated     General bed mobility comments: vcs for technique  Transfers       Sit to Stand: Min assist         General transfer comment: vcs for safety and hand placement    Balance                                            ADL Overall ADL's : Needs assistance/impaired Eating/Feeding: Set up;Sitting   Grooming: Set up;Sitting   Upper Body Bathing: Set up;Sitting   Lower Body Bathing: Moderate assistance;Sit to/from stand   Upper Body Dressing : Minimal assistance;Sitting   Lower Body Dressing: Maximal assistance;Sit to/from stand   Toilet Transfer: Minimal assistance;RW;Stand-pivot             General ADL Comments: Pt did not attempt socks; states she wears slippers at home     Vision                     Perception     Praxis      Pertinent Vitals/Pain Denies pain     Hand Dominance  Right   Extremity/Trunk Assessment Upper Extremity Assessment Upper Extremity Assessment: Generalized weakness           Communication Communication Communication: HOH   Cognition Arousal/Alertness: Awake/alert Behavior During Therapy: WFL for tasks assessed/performed Overall Cognitive Status: No family/caregiver present to determine baseline cognitive functioning.  Pt is a poor historian and has decreased STM                     General Comments       Exercises       Shoulder Instructions      Home Living Family/patient expects to be discharged to:: Private residence Living Arrangements: Alone                               Additional Comments: has caregiver several hours a day      Prior Functioning/Environment Level of Independence: Needs assistance        Comments: amount of assistance for adls unknown for PLOF    OT Diagnosis: Generalized weakness   OT Problem List: Decreased strength;Decreased activity tolerance;Impaired  balance (sitting and/or standing);Decreased cognition;Decreased safety awareness   OT Treatment/Interventions: Self-care/ADL training;DME and/or AE instruction;Therapeutic activities;Cognitive remediation/compensation;Patient/family education;Balance training    OT Goals(Current goals can be found in the care plan section) Acute Rehab OT Goals Patient Stated Goal: not stated OT Goal Formulation: With patient Time For Goal Achievement: 09/14/13 Potential to Achieve Goals: Good ADL Goals Pt Will Perform Grooming: with supervision;standing Pt Will Transfer to Toilet: with min guard assist;bedside commode;ambulating Pt Will Perform Toileting - Clothing Manipulation and hygiene: with min guard assist;sit to/from stand  OT Frequency: Min 2X/week   Barriers to D/C:            Co-evaluation              End of Session Nurse Communication:  (pt up in chair:  oriented to callbell/needs reinforcement)  Activity  Tolerance: Patient tolerated treatment well Patient left: in chair;with call bell/phone within reach   Time: [id number] OT Time Calculation (min): 24 min Charges:  OT General Charges $OT Visit: 1 Procedure OT Evaluation $Initial OT Evaluation Tier I: 1 Procedure OT Treatments $Self Care/Home Management : 8-22 mins G-Codes:    Marica OtterMaryellen Symphoni [name] 08/31/2013, 9:08 AM  Marica OtterMaryellen Malachai [name], OTR/L [phone] 08/31/2013

## 2013-08-31 NOTE — Progress Notes (Signed)
CSW assisting with d/c planning. Pt has agreed to accept rehab placement following hospital d/c. [facility]. SNF bed will be available when pt is ready for d/c. CSW will continue to follow to assist with d/c planning to SNF.  Cori RazorJamie Danil Wedge LCSW [phone]

## 2013-09-01 LAB — GLUCOSE, CAPILLARY
GLUCOSE-CAPILLARY: 198 mg/dL — AB (ref 70–99)
GLUCOSE-CAPILLARY: 243 mg/dL — AB (ref 70–99)
Glucose-Capillary: 187 mg/dL — ABNORMAL HIGH (ref 70–99)

## 2013-09-01 LAB — BASIC METABOLIC PANEL
BUN: 11 mg/dL (ref 6–23)
CHLORIDE: 104 meq/L (ref 96–112)
CO2: 24 mEq/L (ref 19–32)
CREATININE: 0.91 mg/dL (ref 0.50–1.10)
Calcium: 8.2 mg/dL — ABNORMAL LOW (ref 8.4–10.5)
GFR calc Af Amer: 64 mL/min — ABNORMAL LOW (ref >90)
GFR calc non Af Amer: 55 mL/min — ABNORMAL LOW (ref >90)
GLUCOSE: 151 mg/dL — AB (ref 70–99)
POTASSIUM: 3.5 meq/L — AB (ref 3.7–5.3)
Sodium: 141 mEq/L (ref 137–147)

## 2013-09-01 LAB — PROTIME-INR
INR: 1.76 — ABNORMAL HIGH (ref 0.00–1.49)
Prothrombin Time: 20 seconds — ABNORMAL HIGH (ref 11.6–15.2)

## 2013-09-01 MED ORDER — POTASSIUM CHLORIDE CRYS ER 20 MEQ PO TBCR: 10.0000 meq | EXTENDED_RELEASE_TABLET | Freq: Every day | ORAL | Status: AC

## 2013-09-01 MED ORDER — SENNOSIDES-DOCUSATE SODIUM 8.6-50 MG PO TABS: 1.0000 | ORAL_TABLET | Freq: Two times a day (BID) | ORAL | Status: AC

## 2013-09-01 MED ORDER — ALPRAZOLAM 0.25 MG PO TABS: 0.1250 mg | ORAL_TABLET | Freq: Two times a day (BID) | ORAL | Status: AC | PRN

## 2013-09-01 MED ORDER — LOSARTAN POTASSIUM 100 MG PO TABS: 50.0000 mg | ORAL_TABLET | Freq: Every day | ORAL | Status: AC

## 2013-09-01 MED ORDER — INSULIN GLARGINE 100 UNIT/ML ~~LOC~~ SOLN: 20.0000 [IU] | Freq: Every day | SUBCUTANEOUS | Status: AC

## 2013-09-01 MED ORDER — FUROSEMIDE 40 MG PO TABS: 20.0000 mg | ORAL_TABLET | ORAL | Status: DC

## 2013-09-01 MED ORDER — FLEET ENEMA 7-19 GM/118ML RE ENEM: 1.0000 | ENEMA | Freq: Every day | RECTAL | Status: AC | PRN

## 2013-09-01 NOTE — Progress Notes (Signed)
Inpatient Diabetes Program Recommendations  AACE/ADA: New Consensus Statement on Inpatient Glycemic Control (2013)  Target Ranges:  Prepandial:   less than 140 mg/dL      Peak postprandial:   less than 180 mg/dL (1-2 hours)      Critically ill patients:  140 - 180 mg/dL   Reason for Assessment:  Results for Krystal [name], Krystal [name] (MRN [id number]) as of 09/01/2013 10:21  Ref. Range 08/31/2013 11:55 08/31/2013 17:09 08/31/2013 21:36 09/01/2013 05:28 09/01/2013 07:46  Glucose-Capillary Latest Range: 70-99 mg/dL 629270 (H) 528204 (H) 413100 (H) 198 (H) 187 (H)   Diabetes history: Type 2 diabetes Outpatient Diabetes medications: Per medication reconciliation, patient was taking Lantus 45 units daily and Januvia 50 mg daily Current orders for Inpatient glycemic control: Novolog sensitive tid with meals and HS.  May consider restarting a portion of patient's home dose of Lantus.  Consider Lantus 15 units daily while in the hospital.    Thanks, Beryl MeagerJenny Malesha Suliman, RN, BC-ADM Inpatient Diabetes Coordinator Pager [phone]

## 2013-09-01 NOTE — Progress Notes (Signed)
Clinical Social Work Department CLINICAL SOCIAL WORK PLACEMENT NOTE 09/01/2013  Patient:  Krystal [name],[name]  Account Number:  [id number] Admit date:  08/22/2013  Clinical Social Worker:  Cori RazorJAMIE Kiyah Demartini, LCSW  Date/time:  08/23/2013 08:09 AM  Clinical Social Work is seeking post-discharge placement for this patient at the following level of care:   SKILLED NURSING   (*CSW will update this form in Epic as items are completed)   08/23/2013  Patient/family provided with [facility] Department of Clinical Social Work's list of facilities offering this level of care within the geographic area requested by the patient (or if unable, by the patient's family).  08/23/2013  Patient/family informed of their freedom to choose among providers that offer the needed level of care, that participate in Medicare, Medicaid or managed care program needed by the patient, have an available bed and are willing to accept the patient.    Patient/family informed of [facility]' ownership interest in [facility], as well as of the fact that they are under no obligation to receive care at this facility.  PASARR submitted to EDS on 08/23/2013 PASARR number received from EDS on 08/23/2013  FL2 transmitted to all facilities in geographic area requested by pt/family on  08/23/2013 FL2 transmitted to all facilities within larger geographic area on   Patient informed that his/her managed care company has contracts with or will negotiate with  certain facilities, including the following:     Patient/family informed of bed offers received:  08/24/2013 [facility] Physician recommends and patient chooses bed at    [facility]  09/01/2013 Patient to be transferred to facility by [facility]  The following physician request were entered in Epic:   Additional Comments:   Cori RazorJamie Chantavia Bazzle LCSW [phone]

## 2013-09-01 NOTE — Discharge Summary (Addendum)
Physician Discharge Summary  Krystal [name]:[id number] DOB: [birth year] DOA: 08/22/2013  PCP: Mickie Hillier, MD  Admit date: 08/22/2013 Discharge date: 09/01/2013  Time spent: >35 minutes  Recommendations for Outpatient Follow-up:  F/u with PCP in 1-2 weeks   Discharge Diagnoses:  Principal Problem:   Acute encephalopathy Active Problems:   CAD (coronary artery disease)   Diabetes mellitus type 2, uncontrolled   Lower extremity edema   Atrial fibrillation   History of pulmonary embolism   HTN (hypertension)   SBO (small bowel obstruction)   Leukocytosis, unspecified   Anemia of chronic disease   Chronic kidney disease (CKD), stage IV (severe)   UTI (urinary tract infection)   Discharge Condition: stable   Diet recommendation: heart healthy, DM   Filed Weights   08/25/13 0500 08/28/13 0500 08/29/13 0500  Weight: 106 kg (233 lb 11 oz) 97.5 kg (214 lb 15.2 oz) 97.1 kg (214 lb 1.1 oz)    History of present illness:  80 year old female with past medical history of hypertension, atrial fibrillation, DVT and PE on coumadin, diabetes, recent hospitalization for UTI due to serratia (was supposed to be on cipro for 10 days after discharge 4/2) who presented to [facility] ED 08/22/2013 with worsening mental status changed in past 24 hours prior to this admission. Pt is not a good historian as she is lethargic at this time. She was given morphine for pain in ED. Patient was apparently (per her daughters) hallucinating and thinking someone has robbed her house and then she did not know where she was and could not recognize her daughters.  In ED, vitals are stable. Blood work revealed WBC count 10.9, hemoglobin 10.2, creatinine 1.94. Abdominal x ray showed new distention of small bowel loops possible small bowel obstruction. This was also seen on CT abdomen.    Hospital Course:  1. Acute encephalopathy on top of dementia/progressiv e  Possible progressive dementia but also UTI. Cx from 3/30  with serratia. Also wonder if SBO is playing a role in her confusion. Discontinue all antibiotics at this point. She is more alert today than in days prior. 4/12: much improved clinically; following commands and can carry a conversation. Reports feeling better this AM. Completely resolved 4/14.  2. SBO (small bowel obstruction)  -Clinically improving. NG out and tolerating diet   3. Diabetes mellitus type 2, uncontrolled; HA1C 6.4; decreased lantus to 20 titrate; as tolerate d 4. Atrial fibrillation  Rate controlled. Resume Cardizem, warfarin;  5. History of pulmonary embolism on coumadin  6. Leukocytosis, unspecified  Secondary to UTI/SBO; completed atx 7. Anemia of chronic disease  Secondary to history of CKD. Hemoglobin drop on admission 9.4 --> 8.8, no signs of overt bleeding. F/u in 1 week with CBC 9. Chronic kidney disease (CKD), stage IV (severe)  Creatinine 2.11 on 08/17/2013 and on this admission 1.9. Creatinine is now WNL.  -no s/s of fluid overload; lasix changed to 230 every other day; titrate per response  10. Moderate malnutrition  Secondary to acute illness, cont encourage PO intake;   D/w family at the bedside; daughter, son in law -overall prognosis is poor due to advanced age with dementia  Procedures:  None  (i.e. Studies not automatically included, echos, thoracentesis, etc; not x-rays)  Consultations:  surgery   Discharge Exam: Filed Vitals:   09/01/13 1034  BP: 116/50  Pulse:   Temp:   Resp:     General: alert Cardiovascular: s1,s2 rrr Respiratory: CTA BL  Discharge Instructions  Discharge Orders   Future Orders Complete By Expires   Diet - low sodium heart healthy  As directed    Discharge instructions  As directed    Increase activity slowly  As directed        Medication List    STOP taking these medications       ciprofloxacin 500 MG tablet  Commonly known as:  CIPRO      TAKE these medications       acetaminophen 500 MG tablet   Commonly known as:  TYLENOL  Take 500-1,000 mg by mouth every 6 (six) hours as needed (for pain).     ALPRAZolam 0.25 MG tablet  Commonly known as:  XANAX  Take 0.5-1 tablets (0.125-0.25 mg total) by mouth 2 (two) times daily as needed for sleep or anxiety (0.5-1 tablet).     cetirizine 10 MG chewable tablet  Commonly known as:  ZYRTEC  Chew 10 mg by mouth daily.     diltiazem 120 MG tablet  Commonly known as:  CARDIZEM  Take 120 mg by mouth 2 (two) times daily.     ferrous sulfate 325 (65 FE) MG tablet  Take 325 mg by mouth at bedtime.     Fluticasone-Salmeterol 250-50 MCG/DOSE Aepb  Commonly known as:  ADVAIR  Inhale 1 puff into the lungs 2 (two) times daily as needed (shortness of breath).     furosemide 40 MG tablet  Commonly known as:  LASIX  Take 0.5 tablets (20 mg total) by mouth every other day. Take 1.5 tablets     insulin glargine 100 UNIT/ML injection  Commonly known as:  LANTUS  Inject 0.2 mLs (20 Units total) into the skin at bedtime.     lidocaine 5 %  Commonly known as:  LIDODERM  Place 1 patch onto the skin daily. Applied to back. Remove & Discard patch within 12 hours or as directed by MD     losartan 100 MG tablet  Commonly known as:  COZAAR  Take 0.5 tablets (50 mg total) by mouth daily.     methimazole 10 MG tablet  Commonly known as:  TAPAZOLE  Take 5 mg by mouth daily.     metoCLOPramide 5 MG tablet  Commonly known as:  REGLAN  Take 2.5 mg by mouth 2 (two) times daily.     mirtazapine 15 MG tablet  Commonly known as:  REMERON  Take 15 mg by mouth at bedtime.     MYRBETRIQ 25 MG Tb24 tablet  Generic drug:  mirabegron ER  Take 25 mg by mouth daily.     potassium chloride SA 20 MEQ tablet  Commonly known as:  K-DUR,KLOR-CON  Take 0.5 tablets (10 mEq total) by mouth daily.     pravastatin 20 MG tablet  Commonly known as:  PRAVACHOL  Take 20 mg by mouth daily.     senna-docusate 8.6-50 MG per tablet  Commonly known as:  Senokot-S   Take 1 tablet by mouth 2 (two) times daily.     sitaGLIPtin 50 MG tablet  Commonly known as:  JANUVIA  Take 50 mg by mouth daily.     sodium phosphate 7-19 GM/118ML Enem  Place 133 mLs (1 enema total) rectally daily as needed for severe constipation.     SYSTANE OP  Apply 1 drop to eye daily as needed (dry eye).     vitamin B-12 1000 MCG tablet  Commonly known as:  CYANOCOBALAMIN  Take 1,000 mcg by mouth daily.  warfarin 2 MG tablet  Commonly known as:  COUMADIN  Take 2-3 mg by mouth daily. 2 mg on sun, tue, thur and 3 mg all other days       Allergies  Allergen Reactions  . Humibid La [Guaifenesin] Shortness Of Breath  . Latex Hives  . Detrol [Tolterodine Tartrate] Hives  . Keflex [Cephalexin]     hives  . Vioxx [Rofecoxib] Hives       Follow-up Information   Follow up with Mickie Hillier, MD In 1 week.   Specialty:  Family Medicine   Contact information:   [address] [place] [address] [phone]        The results of significant diagnostics from this hospitalization (including imaging, microbiology, ancillary and laboratory) are listed below for reference.    Significant Diagnostic Studies: Ct Abdomen Pelvis Wo Contrast  08/22/2013   CLINICAL DATA:  Abdominal pain, back pain. Will a small bowel obstruction.  EXAM: CT ABDOMEN AND PELVIS WITHOUT CONTRAST  TECHNIQUE: Multidetector CT imaging of the abdomen and pelvis was performed following the standard protocol without intravenous contrast.  COMPARISON:  DG ABD ACUTE W/CHEST dated 08/22/2013; CT ABD/PELV WO CM dated 08/15/2013  FINDINGS: Heart is normal size. Linear areas of atelectasis in the lung bases. No effusions.  Prior cholecystectomy. Liver, spleen, right adrenal gland have an unremarkable unenhanced appearance. Mild fatty replacement of the pancreas without focal abnormality. Small nodule in the left adrenal body measuring 13 mm and with a density of -12 Hounsfield units, compatible with  adenoma. Mild cortical thinning within the kidneys without hydronephrosis or visible focal abnormality on this unenhanced study.  There are slightly dilated and fluid-filled small bowel loops in the abdomen and pelvis. Scattered air-fluid levels. The distal small bowel loops are decompressed. Findings suspicious for early or low grade partial small bowel obstruction. The exact transition point or cause is not visualized. No free fluid or free air.  Suprapubic catheter is in place with the urinary bladder decompressed. Scattered diverticula throughout the colon, most pronounced in the sigmoid colon. No evidence of diverticulitis. No free fluid, free air or adenopathy. Appendix is visualized and is normal.  Aorta and iliac vessels are calcified, non aneurysmal.  No acute bony abnormality. Degenerative changes in the thoracolumbar spine. Mild compression fracture of the L1 superior endplate which is stable since prior CT.  Stool burden in the rectosigmoid colon has decreased since prior study.  IMPRESSION: Slight prominence of abdominal and pelvic small bowel loops with decompressed distal small bowel. Findings may reflect early or low grade partial small bowel obstruction.  Decreasing rectum sigmoid stool burden.  Colonic diverticulosis.  Suprapubic catheter remains in the bladder which is decompressed.   Electronically Signed   By: Charlett Nose M.D.   On: 08/22/2013 09:05   Ct Abdomen Pelvis Wo Contrast  08/15/2013   CLINICAL DATA:  Abdominal pain.  No recent bowel movement.  EXAM: CT ABDOMEN AND PELVIS WITHOUT CONTRAST  TECHNIQUE: Multidetector CT imaging of the abdomen and pelvis was performed following the standard protocol without intravenous contrast.  COMPARISON:  12/30/2012 CT.  FINDINGS: Moderate stool rectosigmoid region. Scattered colonic diverticula without extra luminal bowel inflammatory process, free fluid or free air.  Lateral to the right rectus muscle there is bulge/ mild Spigelian hernia  containing fat and small bowel loops. This does not cause obstruction.  Suprapubic catheter in place.  Left adrenal nodule consistent with small adenoma stable.  Post cholecystectomy and hysterectomy.  Taking into account  limitation by non contrast imaging, No worrisome hepatic, splenic, renal, right renal or pancreatic lesion.  Atherosclerotic type changes coronary arteries, aorta and aortic branch vessels. No abdominal aortic aneurysm.  Chronic L1 anterior wedge compression deformity.  IMPRESSION: Moderate stool rectosigmoid region. Scattered colonic diverticula without extra luminal bowel inflammatory process, free fluid or free air.  Please see above.   Electronically Signed   By: Bridgett LarssonSteve  Olson M.D.   On: 08/15/2013 16:54   Dg Chest Port 1 View  08/29/2013   CLINICAL DATA:  Shortness of breath.  EXAM: PORTABLE CHEST - 1 VIEW  COMPARISON:  08/22/2013  FINDINGS: There is a nasogastric tube with tip below the field of view. Mild cardiac enlargement is noted. There is no pleural effusion or edema identified. Chronic pleural and parenchymal scarring in the periphery of the left base is again noted. No airspace consolidation.  IMPRESSION: 1. No acute cardiopulmonary abnormalities.   Electronically Signed   By: Signa Kellaylor  Stroud M.D.   On: 08/29/2013 15:57   Dg Abd 2 Views  08/29/2013   CLINICAL DATA:  Week.  Generalized Malaise  EXAM: ABDOMEN - 2 VIEW  COMPARISON:  08/27/2013  FINDINGS: Mild dilation of small bowel is again noted with scattered air-fluid levels on the decubitus view. The degree of small bowel dilation is without substantial change. Air is seen within a non distended colon. There is no free air. Nasogastric tube is stable and well positioned.  IMPRESSION: 1. No significant change. 2. Mild small bowel dilation with scattered air-fluid levels supports a low grade small bowel obstruction. No free air.   Electronically Signed   By: Amie Portlandavid  Ormond M.D.   On: 08/29/2013 08:12   Dg Abd 2 Views  08/27/2013    CLINICAL DATA:  Nausea and vomiting, follow-up small bowel obstruction  EXAM: ABDOMEN - 2 VIEW  COMPARISON:  DG ABD 2 VIEWS dated 08/26/2013  FINDINGS: There remain loops of mildly distended gas-filled small bowel in the mid and lower abdomen. No free extraluminal gas collections are demonstrated. There is contrast within loops of normal calibered colon similar to that previously demonstrated. There are degenerative changes of the lumbar spine. The esophagogastric tube tip in proximal port lie in the region of the gastric body.  IMPRESSION: There has not been significant interval change in the appearance of the distal small bowel obstruction.   Electronically Signed   By: David  SwazilandJordan   On: 08/27/2013 09:32   Dg Abd 2 Views  08/26/2013   CLINICAL DATA:  Small bowel obstruction  EXAM: ABDOMEN - 2 VIEW  COMPARISON:  08/25/2013  FINDINGS: NG tube within the stomach. Similar pattern of moderately dilated small bowel in the right abdomen. Contrast remains present in the colon. No free air evident. Minimal interval change.  IMPRESSION: Stable small bowel dilatation compatible with partial SBO. No free air.   Electronically Signed   By: Ruel Favorsrevor  Shick M.D.   On: 08/26/2013 08:28   Dg Abd 2 Views  08/25/2013   CLINICAL DATA:  Small bowel obstruction, followup, persistent abdominal distention  EXAM: ABDOMEN - 2 VIEW  COMPARISON:  Abdomen films of 08/24/2013  FINDINGS: There is persistent gaseous distention of small bowel loops consistent with a partial small bowel obstruction. Some contrast is scattered throughout the nondistended colon. No free air is seen. An NG tube is present with the tip in the region of the distal antrum of the stomach.  IMPRESSION: Persistently dilated loops of small bowel consistent with partial small bowel  obstruction. No free air.   Electronically Signed   By: Dwyane Dee M.D.   On: 08/25/2013 08:59   Dg Abd 2 Views  08/24/2013   CLINICAL DATA:  Follow-up small bowel obstruction. Abdominal  distention.  EXAM: ABDOMEN - 2 VIEW  COMPARISON:  CT ABD/PELV WO CM dated 08/22/2013; DG ABD ACUTE W/CHEST dated 08/22/2013  FINDINGS: Multiple gas-filled, dilated loops of small bowel are present in the central abdomen. The loops measure up to approximately 4 cm in diameter, and the degree of distension may have increased slightly since the prior CT. Oral contrast material is present in the colon. A few small air-fluid levels are noted. No intraperitoneal free air is identified. A suprapubic catheter is noted. Degenerative changes are noted in the lumbar spine and both hip joints.  IMPRESSION: Persistent small bowel dilatation, concerning for partial obstruction and stable to slightly worsened compared to prior CT.   Electronically Signed   By: Sebastian Ache   On: 08/24/2013 15:01   Dg Abd Acute W/chest  08/22/2013   CLINICAL DATA:  Abdominal pain.  EXAM: ACUTE ABDOMEN SERIES (ABDOMEN 2 VIEW & CHEST 1 VIEW)  COMPARISON:  Chest radiograph performed 02/28/2013, and CT of the abdomen and pelvis performed 08/15/2013  FINDINGS: The lungs are well-aerated. Mild vascular congestion is noted. There is no evidence of focal opacification, pleural effusion or pneumothorax. The cardiomediastinal silhouette is within normal limits. There is chronic elevation of the lateral left hemidiaphragm.  There is new distention of small bowel loops to 4.2 cm in diameter, raising concern for some degree of small bowel obstruction. Residual air and contrast is noted within the colon. No free intra-abdominal air is identified on the provided decubitus view.  No acute osseous abnormalities are seen; the sacroiliac joints are unremarkable in appearance.  IMPRESSION: 1. New distention of small bowel loops to 4.2 cm in maximal diameter, raising concern for some degree of small bowel obstruction. Alternately, this could reflect mild ileus. No free intra-abdominal air seen. 2. Mild vascular congestion noted; lungs remain grossly clear.    Electronically Signed   By: Roanna Raider M.D.   On: 08/22/2013 06:37    Microbiology: No results found for this or any previous visit (from the past 240 hour(s)).   Labs: Basic Metabolic Panel:  Recent Labs Lab 08/28/13 0750 08/29/13 0457 08/30/13 0504 08/31/13 0500 09/01/13 0428  NA 139 139 140 140 141  K 3.8 3.8 3.5* 3.6* 3.5*  CL 99 102 102 104 104  CO2 26 27 27 25 24   GLUCOSE 108* 125* 129* 108* 151*  BUN 20 18 15 15 11   CREATININE 1.03 0.95 1.03 1.03 0.91  CALCIUM 8.6 8.3* 8.4 8.1* 8.2*  MG  --   --  1.5  --   --    Liver Function Tests: No results found for this basename: AST, ALT, ALKPHOS, BILITOT, PROT, ALBUMIN,  in the last 168 hours No results found for this basename: LIPASE, AMYLASE,  in the last 168 hours No results found for this basename: AMMONIA,  in the last 168 hours CBC:  Recent Labs Lab 08/30/13 0504  WBC 9.6  HGB 8.6*  HCT 27.1*  MCV 82.9  PLT 296   Cardiac Enzymes: No results found for this basename: CKTOTAL, CKMB, CKMBINDEX, TROPONINI,  in the last 168 hours BNP: BNP (last 3 results)  Recent Labs  02/28/13 2030 08/15/13 2235  PROBNP 478.4* 284.1   CBG:  Recent Labs Lab 08/31/13 1709 08/31/13 2136 09/01/13  0528 09/01/13 0746 09/01/13 1152  GLUCAP 204* 100* 198* 187* 243*       Signed:  Esperanza SheetsUlugbek N Tessa Seaberry  [facility] 09/01/2013, 12:10 PM

## 2013-09-01 NOTE — Progress Notes (Signed)
Assessment unchanged. Pt discharged via stetcher accompanied by PTAR attendants x 2 to meet awaiting ambulance to transport pt to [facility].

## 2013-09-01 NOTE — Progress Notes (Signed)
Patient ID: Krystal [name], female   DOB: 09-26-1924, 80 y.o.   MRN: [id number]    Subjective: Pt feels well today.  Care giver said she is more confused today, but had a good day yesterday with no nausea or vomiting.  Tolerating liquids.  Passing BMs  Objective: Vital signs in last 24 hours: Temp:  [97.6 F (36.4 C)-98.3 F (36.8 C)] 97.6 F (36.4 C) (04/16 0529) Pulse Rate:  [60-98] 98 (04/16 0529) Resp:  [18] 18 (04/16 0529) BP: (124-127)/(66-68) 127/68 mmHg (04/16 0529) SpO2:  [93 %-97 %] 94 % (04/16 0848) Last BM Date: 08/31/13  Intake/Output from previous day: 04/15 0701 - 04/16 0700 In: 1470 [P.O.:720; I.V.:750] Out: 2400 [Urine:2400] Intake/Output this shift:    PE: Abd: soft, Nt, ND, +BS  Lab Results:   Recent Labs  08/30/13 0504  WBC 9.6  HGB 8.6*  HCT 27.1*  PLT 296   BMET  Recent Labs  08/31/13 0500 09/01/13 0428  NA 140 141  K 3.6* 3.5*  CL 104 104  CO2 25 24  GLUCOSE 108* 151*  BUN 15 11  CREATININE 1.03 0.91  CALCIUM 8.1* 8.2*   PT/INR  Recent Labs  08/31/13 0500 09/01/13 0428  LABPROT 22.0* 20.0*  INR 1.99* 1.76*   CMP     Component Value Date/Time   NA 141 09/01/2013 0428   K 3.5* 09/01/2013 0428   CL 104 09/01/2013 0428   CO2 24 09/01/2013 0428   GLUCOSE 151* 09/01/2013 0428   BUN 11 09/01/2013 0428   CREATININE 0.91 09/01/2013 0428   CALCIUM 8.2* 09/01/2013 0428   PROT 6.6 08/23/2013 0417   ALBUMIN 2.9* 08/23/2013 0417   AST 22 08/23/2013 0417   ALT 13 08/23/2013 0417   ALKPHOS 99 08/23/2013 0417   BILITOT 0.4 08/23/2013 0417   GFRNONAA 55* 09/01/2013 0428   GFRAA 64* 09/01/2013 0428   Lipase     Component Value Date/Time   LIPASE 18 08/15/2013 1205       Studies/Results: No results found.  Anti-infectives: Anti-infectives   Start     Dose/Rate Route Frequency Ordered Stop   08/23/13 1600  piperacillin-tazobactam (ZOSYN) IVPB 3.375 g  Status:  Discontinued     3.375 g 12.5 mL/hr over 240 Minutes Intravenous 3 times per day  08/23/13 0638 08/25/13 1807   08/22/13 1800  piperacillin-tazobactam (ZOSYN) IVPB 3.375 g  Status:  Discontinued     3.375 g 12.5 mL/hr over 240 Minutes Intravenous 3 times per day 08/22/13 1743 08/23/13 0638   08/22/13 1700  doxycycline (VIBRAMYCIN) 100 mg in dextrose 5 % 250 mL IVPB  Status:  Discontinued     100 mg 125 mL/hr over 120 Minutes Intravenous Every 12 hours 08/22/13 1603 08/26/13 1224       Assessment/Plan  1. PSBO vs ileus 2. Nausea/vomiting, resolved  Plan: 1. Ok to advance diet as tolerates.  No plans for surgical intervention.  We will sign off.  Please call as needed.   LOS: 10 days    Letha CapeKelly E Fae Blossom 09/01/2013, 9:39 AM Pager: [phone]

## 2013-09-01 NOTE — Progress Notes (Signed)
Report called to [facility] and given to  [place], [place]. Awaiting ambulance to arrive to transport pt to facility. Family at bedside.

## 2013-09-01 NOTE — Progress Notes (Signed)
Dr. York SpanielBuriev rounded on pt and made aware pt's BP 116/50 with HR 71. Asymptomatic. MD instructed to hold this am's Cardizem dose.  See new order in EPIC.

## 2013-09-03 NOTE — Progress Notes (Signed)
Delayed note entry.   Discussed with PA Pt examined later in day  Soft, nt, nd, Bowels functioning  Will sign off  Keedan Sample M. Andrey CampanileWilson, MD, FACS General, Bariatric, & Minimally Invasive Surgery [facility], [place]

## 2013-12-02 ENCOUNTER — Telehealth: Payer: Self-pay | Admitting: Cardiovascular Disease

## 2013-12-02 ENCOUNTER — Encounter: Payer: Self-pay | Admitting: Physician Assistant

## 2013-12-02 NOTE — Telephone Encounter (Signed)
The pt was seen by Dr Clarene DukeLittle today and they drew stat labs to evaluate kidney function.  The pt's symptoms have been ongoing for a few months.  Office note reviewed and I will need to obtain lab results to determine if the pt's diuretics can be increased.  I will also have to review Dr Shawna Clampooper/Lori Gerhardt NP schedule to determine when the pt can be seen.

## 2013-12-02 NOTE — Telephone Encounter (Signed)
New message            Pt PCP says pt edema is worsening and would like pt to be seen / Please give pt a call , office is faxing notes over for Lauren to review

## 2013-12-05 NOTE — Telephone Encounter (Signed)
Message copied by Debbe BalesGONZALEZ, DANIELLE R on Mon Dec 05, 2013  1:33 PM ------      Message from: Rosalio MacadamiaGERHARDT, LORI C      Created: Mon Dec 05, 2013 11:31 AM       Danielle,            Can we get her in one of my blocked spots when I return???            Lawson FiscalLori       ----- Message -----         From: Tonny BollmanMichael Cooper, MD         Sent: 12/03/2013   7:06 AM           To: Sharyn BlitzLauren Watford Brown, RN, Rosalio MacadamiaLori C Gerhardt, NP            Have a note from Catha GosselinKevin Little requesting to see her for worsening edema. Can we set up an appt with Lawson FiscalLori? thx       ------

## 2013-12-06 NOTE — Telephone Encounter (Signed)
Message copied by Debbe BalesGONZALEZ, DANIELLE R on Tue Dec 06, 2013  9:02 AM ------      Message from: Rosalio MacadamiaGERHARDT, LORI C      Created: Mon Dec 05, 2013 11:31 AM       Danielle,            Can we get her in one of my blocked spots when I return???            Lawson FiscalLori       ----- Message -----         From: Tonny BollmanMichael Cooper, MD         Sent: 12/03/2013   7:06 AM           To: Sharyn BlitzLauren Watford Brown, RN, Rosalio MacadamiaLori C Gerhardt, NP            Have a note from Catha GosselinKevin Little requesting to see her for worsening edema. Can we set up an appt with Lawson FiscalLori? thx       ------

## 2013-12-07 ENCOUNTER — Telehealth: Payer: Self-pay | Admitting: *Deleted

## 2013-12-07 NOTE — Telephone Encounter (Signed)
Follow up ° ° ° ° ° ° ° ° ° °Pt returning nurse call  °

## 2013-12-07 NOTE — Telephone Encounter (Signed)
Left message on machine for pt's daughter to call make appointment per Lawson FiscalLori

## 2013-12-07 NOTE — Telephone Encounter (Signed)
Pt scheduled to see Dr Excell Seltzerooper 12/19/13 per Daphene Calamityanielle G.

## 2013-12-09 NOTE — Telephone Encounter (Signed)
Pt to see Lawson FiscalLori 8/3

## 2013-12-19 ENCOUNTER — Ambulatory Visit: Payer: Medicare Other | Admitting: Nurse Practitioner

## 2013-12-26 ENCOUNTER — Encounter: Payer: Self-pay | Admitting: Physician Assistant

## 2013-12-26 ENCOUNTER — Ambulatory Visit (INDEPENDENT_AMBULATORY_CARE_PROVIDER_SITE_OTHER): Payer: Medicare Other | Admitting: Physician Assistant

## 2013-12-26 VITALS — BP 110/60 | HR 75 | Ht 59.84 in | Wt 228.0 lb

## 2013-12-26 DIAGNOSIS — I5032 Chronic diastolic (congestive) heart failure: Secondary | ICD-10-CM | POA: Insufficient documentation

## 2013-12-26 DIAGNOSIS — I2510 Atherosclerotic heart disease of native coronary artery without angina pectoris: Secondary | ICD-10-CM

## 2013-12-26 DIAGNOSIS — R0602 Shortness of breath: Secondary | ICD-10-CM

## 2013-12-26 DIAGNOSIS — R600 Localized edema: Secondary | ICD-10-CM

## 2013-12-26 DIAGNOSIS — Z86711 Personal history of pulmonary embolism: Secondary | ICD-10-CM

## 2013-12-26 DIAGNOSIS — N184 Chronic kidney disease, stage 4 (severe): Secondary | ICD-10-CM

## 2013-12-26 DIAGNOSIS — I10 Essential (primary) hypertension: Secondary | ICD-10-CM

## 2013-12-26 DIAGNOSIS — R609 Edema, unspecified: Secondary | ICD-10-CM

## 2013-12-26 NOTE — Progress Notes (Signed)
Cardiology Office Note    Date:  12/26/2013   ID:  Krystal [name], DOB 06-07-1924, MRN [id number]  PCP:  Mickie HillierLITTLE,[name], MD  Cardiologist:  Dr. Tonny BollmanMichael Cooper     History of Present Illness: Krystal [name] is a 80 y.o. female prior patient of Dr. Deborah Chalkennant with a hx of minimal CAD by cath in 2006, HTN, DM, very remote PAF, DVT on chronic coumadin, CKD, chronic LE edema, anemia, dementia.  Admitted in 08/2013 with urosepsis.  PCP recent contacted Dr. Excell Seltzerooper.  Patient apparently had increased edema and she was brought in for follow up.  She is here with her daughter who provides the hx given baseline dementia.  The patient's legs were improved after she left the hospital in 08/2013.  Since, she has gained 14 lbs.  She notes orthopnea and increased DOE.  She is NYHA 3.  No chest pain.  No syncope.  She has a mild NP cough.  There has been some wheezing.     Studies:  - LHC (5/06):  Mid LAD 20%, EF 60%  - Echo (3/14):  Mild LVH, EF 60%, no RWMA, Gr 1 DD, normal RV function   Recent Labs/Images: 08/15/2013: HDL Cholesterol by NMR 39*; LDL (calc) 57; Pro B Natriuretic peptide (BNP) 284.1  08/22/2013: TSH 3.610  08/23/2013: ALT 13  08/30/2013: Hemoglobin 8.6*  09/01/2013: Creatinine 0.91; Potassium 3.5* 12/02/13:  Hgb 10.3, BUN 34, Cr 2.08, ALT 5, TSH 1.86, BNP 57   Wt Readings from Last 3 Encounters:  12/26/13 228 lb (103.42 kg)  08/29/13 214 lb 1.1 oz (97.1 kg)  08/16/13 226 lb 10.1 oz (102.8 kg)     Past Medical History  Diagnosis Date  . Hypertension   . GERD (gastroesophageal reflux disease)   . Diabetes mellitus   . History of atrial fibrillation EPISODE YRS AGO  . Urinary retention     s/p suprapubic catheter November 2013  . Coronary artery disease cardiologist- dr Maylon Costennent--  lov 09-12-2010 in epic    non-obstructinve min. cad  . Foley catheter in place   . Edema of lower extremity BILATERAL LEG  . Hyperlipidemia   . History of pulmonary embolus (PE) 2010--  TAKES  COUMADIN  . History of DVT of lower extremity   . Anticoagulated on Coumadin   . Generalized weakness AGE RELATED--  USES WALKER  . Vertigo   . OA (osteoarthritis)   . Thin skin FRAGILE  . Hypothyroidism NO MEDS  . Impaired hearing BILATERAL AIDS  . Chronic diarrhea INTERMITTANT    Current Outpatient Prescriptions  Medication Sig Dispense Refill  . acetaminophen (TYLENOL) 500 MG tablet Take 500-1,000 mg by mouth every 6 (six) hours as needed (for pain).      Marland Kitchen. ALPRAZolam (XANAX) 0.25 MG tablet Take 0.5-1 tablets (0.125-0.25 mg total) by mouth 2 (two) times daily as needed for sleep or anxiety (0.5-1 tablet).  30 tablet  0  . bumetanide (BUMEX) 1 MG tablet Take 1 mg by mouth 2 (two) times daily.      . cetirizine (ZYRTEC) 10 MG tablet TAKE 1 TABLET BY MOUTH EVERY DAY      . diltiazem (CARDIZEM) 120 MG tablet Take 120 mg by mouth 2 (two) times daily.      . ferrous sulfate 325 (65 FE) MG tablet Take 325 mg by mouth at bedtime.       . Fluticasone-Salmeterol (ADVAIR) 250-50 MCG/DOSE AEPB Inhale 1 puff into the lungs 2 (two) times daily as  needed (shortness of breath).       . insulin glargine (LANTUS) 100 UNIT/ML injection Inject 0.2 mLs (20 Units total) into the skin at bedtime.  10 mL  12  . lidocaine (LIDODERM) 5 % Place 1 patch onto the skin daily. Applied to back. Remove & Discard patch within 12 hours or as directed by MD      . losartan (COZAAR) 100 MG tablet Take 0.5 tablets (50 mg total) by mouth daily.      . methimazole (TAPAZOLE) 10 MG tablet Take 5 mg by mouth daily.      . metoCLOPramide (REGLAN) 5 MG tablet Take 2.5 mg by mouth 2 (two) times daily.       . mirabegron ER (MYRBETRIQ) 25 MG TB24 tablet Take 25 mg by mouth daily.      . mirtazapine (REMERON) 15 MG tablet Take 15 mg by mouth at bedtime.      [name] (SYSTANE OP) Apply 1 drop to eye daily as needed (dry eye).      . potassium chloride SA (K-DUR,KLOR-CON) 20 MEQ tablet Take 0.5 tablets (10  mEq total) by mouth daily.      . pravastatin (PRAVACHOL) 20 MG tablet Take 20 mg by mouth daily.       Marland Kitchen senna-docusate (SENOKOT-S) 8.6-50 MG per tablet Take 1 tablet by mouth 2 (two) times daily.      . sitaGLIPtin (JANUVIA) 50 MG tablet Take 50 mg by mouth daily.      . sodium phosphate (FLEET) 7-19 GM/118ML ENEM Place 133 mLs (1 enema total) rectally daily as needed for severe constipation.    0  . vitamin B-12 (CYANOCOBALAMIN) 1000 MCG tablet Take 1,000 mcg by mouth daily.      Marland Kitchen warfarin (COUMADIN) 2 MG tablet Take 2-3 mg by mouth daily. 2 mg on sun, tue, thur and 3 mg all other days       No current facility-administered medications for this visit.     Allergies:   Humibid la; Latex; Detrol; Keflex; and Vioxx   Social History:  The patient  reports that she has never smoked. Her smokeless tobacco use includes Snuff. She reports that she does not drink alcohol or use illicit drugs.   Family History:  The patient's family history includes Cancer in her sister; Diabetes in her father; Heart attack in her father; Heart disease in her mother; Hypertension in her father and mother; Stroke in her mother.   ROS:  Please see the history of present illness.   No bleeding.   All other systems reviewed and negative.   PHYSICAL EXAM: VS:  BP 110/60  Pulse 75  Ht 4' 11.84" (1.52 m)  Wt 228 lb (103.42 kg)  BMI 44.76 kg/m2 Well nourished, well developed, in no acute distress HEENT: normal Neck: no JVDat 90 degrees Cardiac:  normal S1, S2; RRR; no murmur Lungs:  clear to auscultation bilaterally, no wheezing, rhonchi or rales Abd: soft, nontender, no hepatomegaly Ext: brawny 2-3+ bilateral LE edemaup to her knees Skin: warm and dry Neuro:  CNs 2-12 intact, no focal abnormalities noted  EKG:  NSR, HR 75, low voltage, no change from prior tracing     ASSESSMENT AND PLAN:  Bilateral edema of lower extremity:  I suspect this is multifactorial and related to post-thrombotic phlebitis/venous  insufficiency, CKD, diastolic CHF.  She has had chronic LE edema that is now worse.  She just saw nephrology last week.  Lasix was changed  to Bumex and she has a follow up again this week.  Her nephrologist is following her labs closely.  Her daughter does not remember the doctor's name at this time but will let us know.  I will allow the nephrologist to adjust her diuretics for now.  She may benefit from referral to a lymphedema clinic.  Question if she could get Unna boots placed as well.    Chronic diastolic heart failure:  Her lungs are clear.  Recent BNP was normal.  However she is more short of breath.  This may be related to deconditioning, etc.  I will obtain a 2D echo.  Continue current diuretic regimen currently Rx by nephrology.    Dyspnea:  This is likely multifactorial as well and related to deconditioning in addition to diastolic CHF.  She may have an element of obesity hypoventilation syndrome as well. Will get 2D echo as noted.  If EF is down, she would not really be a candidate for aggressive invasive evaluation.  I discussed this with her daughter.  We would pursue medical Rx (ie change CCB to beta blocker, nitrates, etc).    Coronary artery disease:  She had minimal CAD < 10 years ago.  Doubt progression.  But, she is a diabetic.  Obtain echo.   Essential hypertension:  Controlled.   History of pulmonary embolism:  Coumadin managed by PCP.   Chronic kidney disease (CKD), stage IV (severe):  F/u with nephrology as planned.     Disposition:  F/u in 2-3 weeks with me, Norma Fredrickson, NP or Dr. Tonny Bollman.    Signed, Brynda Rim, MHS 12/26/2013 4:08 PM    [facility] [address] [place], [place], [place]  [address] Phone: [phone]; Fax: [phone]

## 2013-12-26 NOTE — Patient Instructions (Signed)
Your physician has requested that you have an echocardiogram. Echocardiography is a painless test that uses sound waves to create images of your heart. It provides your doctor with information about the size and shape of your heart and how well your heart's chambers and valves are working. This procedure takes approximately one hour. There are no restrictions for this procedure.  Your physician recommends that you schedule a follow-up appointment in:  2-3 weeks with Clearence CheekLori, Scott or Dr. Excell Seltzerooper.

## 2014-01-03 ENCOUNTER — Other Ambulatory Visit (HOSPITAL_COMMUNITY): Payer: Medicare Other

## 2014-01-13 ENCOUNTER — Ambulatory Visit (HOSPITAL_COMMUNITY): Payer: Medicare Other | Attending: Cardiology | Admitting: Radiology

## 2014-01-13 DIAGNOSIS — I82409 Acute embolism and thrombosis of unspecified deep veins of unspecified lower extremity: Secondary | ICD-10-CM | POA: Diagnosis not present

## 2014-01-13 DIAGNOSIS — E119 Type 2 diabetes mellitus without complications: Secondary | ICD-10-CM | POA: Diagnosis not present

## 2014-01-13 DIAGNOSIS — R0601 Orthopnea: Secondary | ICD-10-CM | POA: Insufficient documentation

## 2014-01-13 DIAGNOSIS — I10 Essential (primary) hypertension: Secondary | ICD-10-CM | POA: Diagnosis not present

## 2014-01-13 DIAGNOSIS — I4891 Unspecified atrial fibrillation: Secondary | ICD-10-CM | POA: Diagnosis not present

## 2014-01-13 DIAGNOSIS — I5032 Chronic diastolic (congestive) heart failure: Secondary | ICD-10-CM

## 2014-01-13 DIAGNOSIS — R609 Edema, unspecified: Secondary | ICD-10-CM | POA: Diagnosis not present

## 2014-01-13 DIAGNOSIS — I509 Heart failure, unspecified: Secondary | ICD-10-CM | POA: Diagnosis not present

## 2014-01-13 DIAGNOSIS — I2510 Atherosclerotic heart disease of native coronary artery without angina pectoris: Secondary | ICD-10-CM

## 2014-01-13 NOTE — Progress Notes (Signed)
Echocardiogram performed.  

## 2014-01-16 ENCOUNTER — Encounter: Payer: Self-pay | Admitting: Physician Assistant

## 2014-01-16 ENCOUNTER — Ambulatory Visit (INDEPENDENT_AMBULATORY_CARE_PROVIDER_SITE_OTHER): Payer: Medicare Other | Admitting: Physician Assistant

## 2014-01-16 VITALS — BP 130/56 | HR 76 | Ht 59.84 in | Wt 233.0 lb

## 2014-01-16 DIAGNOSIS — I2510 Atherosclerotic heart disease of native coronary artery without angina pectoris: Secondary | ICD-10-CM

## 2014-01-16 DIAGNOSIS — R609 Edema, unspecified: Secondary | ICD-10-CM

## 2014-01-16 DIAGNOSIS — I10 Essential (primary) hypertension: Secondary | ICD-10-CM

## 2014-01-16 DIAGNOSIS — I5032 Chronic diastolic (congestive) heart failure: Secondary | ICD-10-CM

## 2014-01-16 DIAGNOSIS — R0602 Shortness of breath: Secondary | ICD-10-CM

## 2014-01-16 LAB — BASIC METABOLIC PANEL
BUN: 36 mg/dL — AB (ref 6–23)
CHLORIDE: 106 meq/L (ref 96–112)
CO2: 25 mEq/L (ref 19–32)
CREATININE: 2.1 mg/dL — AB (ref 0.4–1.2)
Calcium: 8.4 mg/dL (ref 8.4–10.5)
GFR: 23.91 mL/min — ABNORMAL LOW (ref >60.00)
GLUCOSE: 186 mg/dL — AB (ref 70–99)
Potassium: 4.5 mEq/L (ref 3.5–5.1)
Sodium: 138 mEq/L (ref 135–145)

## 2014-01-16 LAB — BRAIN NATRIURETIC PEPTIDE: PRO B NATRI PEPTIDE: 67 pg/mL (ref 0.0–100.0)

## 2014-01-16 MED ORDER — BUMETANIDE 2 MG PO TABS: 2.0000 mg | ORAL_TABLET | Freq: Two times a day (BID) | ORAL | Status: AC

## 2014-01-16 NOTE — Patient Instructions (Signed)
Your physician has recommended you make the following change in your medication:  1. INCREASE BUMEX TO 2 MG TWICE DAILY  LAB WORK TODAY; BMET, BNP  REPEAT BMET IN 1 WEEK  FOLLOW UP SOONER IF SHORTNESS OF BREATH OR SWELLING GETS WORSE  Your physician recommends that you schedule a follow-up appointment in: 2 MONTHS WITH DR. Excell Seltzer OR SCOTT WEAVER, PAC SAME DAY DR. Excell Seltzer IS IN THE OFFICE

## 2014-01-16 NOTE — Progress Notes (Signed)
Cardiology Office Note    Date:  01/16/2014   ID:  Krystal [name], Krystal [name] 06-14-1924, MRN [id number]  PCP:  Mickie Hillier, MD  Cardiologist:  Dr. Tonny Bollman   Nephrologist:  Dr. Barnabas Lister  History of Present Illness: Krystal [name] is a 80 y.o. female prior patient of Dr. Deborah Chalk with a hx of minimal CAD by cath in 2006, HTN, DM, very remote PAF, DVT on chronic coumadin, CKD, chronic LE edema, anemia, dementia.  Admitted in 08/2013 with urosepsis.  PCP recently contacted Dr. Excell Seltzer b/c the patient had increased edema.  I saw her 8/10.  Her nephrologist had recently changed Lasix to Bumex.  I obtained a FU echo that demonstrated normal systolic and diastolic function.    She returns for FU.  Her LE edema is unchanged.  Dyspnea is unchanged.  She is NYHA 3.  She sleeps on an incline.  She denies chest pain or syncope.   Studies:  - LHC (5/06):  Mid LAD 20%, EF 60%  - Echo (3/14):  Mild LVH, EF 60%, no RWMA, Gr 1 DD, normal RV function  - Echo (8/15):  EF 55-60%, no RWMA, PASP 36 mmHg   Recent Labs/Images: 08/15/2013: HDL Cholesterol by NMR 39*; LDL (calc) 57; Pro B Natriuretic peptide (BNP) 284.1  08/22/2013: TSH 3.610  08/23/2013: ALT 13  08/30/2013: Hemoglobin 8.6*  09/01/2013: Creatinine 0.91; Potassium 3.5* 12/02/13:  Hgb 10.3, BUN 34, Cr 2.08, ALT 5, TSH 1.86, BNP 57   Wt Readings from Last 3 Encounters:  01/16/14 233 lb (105.688 kg)  12/26/13 228 lb (103.42 kg)  08/29/13 214 lb 1.1 oz (97.1 kg)     Past Medical History  Diagnosis Date  . Hypertension   . GERD (gastroesophageal reflux disease)   . Diabetes mellitus   . History of atrial fibrillation     very remote hx (many years ago)  . Urinary retention     s/p suprapubic catheter November 2013  . Coronary artery disease     LHC (5/06):  Mid LAD 20%, EF 60%  . Foley catheter in place   . Edema of lower extremity BILATERAL LEG  . Hyperlipidemia   . History of pulmonary embolus (PE) 2010--  TAKES COUMADIN    . History of DVT of lower extremity   . Anticoagulated on Coumadin   . Generalized weakness AGE RELATED--  USES WALKER  . Vertigo   . OA (osteoarthritis)   . Thin skin FRAGILE  . Hypothyroidism NO MEDS  . Impaired hearing BILATERAL AIDS  . Chronic diarrhea INTERMITTANT  . Hx of echocardiogram     Echo (8/15):  EF 55-60%, no RWMA, PASP 36 mmHg    Current Outpatient Prescriptions  Medication Sig Dispense Refill  . acetaminophen (TYLENOL) 500 MG tablet Take 500-1,000 mg by mouth every 6 (six) hours as needed (for pain).      Marland Kitchen ALPRAZolam (XANAX) 0.25 MG tablet Take 0.5-1 tablets (0.125-0.25 mg total) by mouth 2 (two) times daily as needed for sleep or anxiety (0.5-1 tablet).  30 tablet  0  . bumetanide (BUMEX) 1 MG tablet Take 1 mg by mouth 2 (two) times daily.      . cetirizine (ZYRTEC) 10 MG tablet TAKE 1 TABLET BY MOUTH EVERY DAY      . diltiazem (CARDIZEM) 120 MG tablet Take 120 mg by mouth 2 (two) times daily.      . ferrous sulfate 325 (65 FE) MG tablet Take 325 mg by mouth  at bedtime.       . Fluticasone-Salmeterol (ADVAIR) 250-50 MCG/DOSE AEPB Inhale 1 puff into the lungs 2 (two) times daily as needed (shortness of breath).       . insulin glargine (LANTUS) 100 UNIT/ML injection Inject 0.2 mLs (20 Units total) into the skin at bedtime.  10 mL  12  . lidocaine (LIDODERM) 5 % Place 1 patch onto the skin as needed. Applied to back. Remove & Discard patch within 12 hours or as directed by MD      . losartan (COZAAR) 100 MG tablet Take 0.5 tablets (50 mg total) by mouth daily.      . methimazole (TAPAZOLE) 10 MG tablet Take 5 mg by mouth daily.      . metoCLOPramide (REGLAN) 5 MG tablet Take 2.5 mg by mouth 2 (two) times daily.       . mirabegron ER (MYRBETRIQ) 25 MG TB24 tablet Take 25 mg by mouth daily.      . mirtazapine (REMERON) 15 MG tablet Take 15 mg by mouth at bedtime.      [name] (SYSTANE OP) Apply 1 drop to eye daily as needed (dry eye).      .  potassium chloride SA (K-DUR,KLOR-CON) 20 MEQ tablet Take 0.5 tablets (10 mEq total) by mouth daily.      . pravastatin (PRAVACHOL) 20 MG tablet Take 20 mg by mouth daily.       Marland Kitchen senna-docusate (SENOKOT-S) 8.6-50 MG per tablet Take 1 tablet by mouth 2 (two) times daily.      . sitaGLIPtin (JANUVIA) 50 MG tablet Take 50 mg by mouth daily.      . sodium phosphate (FLEET) 7-19 GM/118ML ENEM Place 133 mLs (1 enema total) rectally daily as needed for severe constipation.    0  . vitamin B-12 (CYANOCOBALAMIN) 1000 MCG tablet Take 1,000 mcg by mouth daily.      Marland Kitchen warfarin (COUMADIN) 2 MG tablet Take 2-3 mg by mouth daily. 2 mg on sun, tue, thur and 3 mg all other days       No current facility-administered medications for this visit.     Allergies:   Humibid la; Latex; Detrol; Keflex; and Vioxx   Social History:  The patient  reports that she has never smoked. Her smokeless tobacco use includes Snuff. She reports that she does not drink alcohol or use illicit drugs.   Family History:  The patient's family history includes Cancer in her sister; Diabetes in her father; Heart attack in her father; Heart disease in her mother; Hypertension in her father and mother; Stroke in her mother.   ROS:  Please see the history of present illness.    All other systems reviewed and negative.   PHYSICAL EXAM: VS:  BP 130/56  Pulse 76  Ht 4' 11.84" (1.52 m)  Wt 233 lb (105.688 kg)  BMI 45.74 kg/m2 Well nourished, well developed, in no acute distress HEENT: normal Neck: no JVDat 90 degrees Cardiac:  normal S1, S2; RRR; no murmur Lungs:  ? Crackles in bases bilaterally, no wheezing, rhonchi   Abd: soft, nontender, no hepatomegaly Ext: brawny 2-3+ bilateral LE edemaup to her knees Skin: warm and dry Neuro:  CNs 2-12 intact, no focal abnormalities noted      ASSESSMENT AND PLAN:  Bilateral edema of lower extremity:  I suspect this is multifactorial and related to post-thrombotic phlebitis/venous  insufficiency, CKD, diastolic CHF.  This is a difficult problem to treat.  If  Unna boots are possible, this would likely help.  Defer to primary care.  I will try to adjust her diuretics.  We will need to keep a close eye on renal function.  If her creatinine starts to rise, we will have to pull back on the dose.  Increase Bumex to 2 mg bid.  Check BMET and BNP today. Repeat BMET in 1 week.   Chronic diastolic heart failure:  As above.    Coronary artery disease:   No angina.  Continue statin.  She is not on ASA as she is on coumadin.    Essential hypertension:  Controlled.   History of pulmonary embolism:  Coumadin managed by PCP.   Chronic kidney disease (CKD), stage IV (severe):  F/u with nephrology as planned.  Keep close eye on renal function with adjustments in diuretics.  Disposition:  FU with Dr. Tonny Bollman or me in 2 mos.  Signed, Brynda Rim, MHS 01/16/2014 2:51 PM    [facility] [address] [place], [place], [place]  [address] Phone: [phone]; Fax: [phone]

## 2014-01-17 ENCOUNTER — Telehealth: Payer: Self-pay | Admitting: *Deleted

## 2014-01-17 NOTE — Telephone Encounter (Signed)
lmptcb on daughter Krystal [name] phone for lab results. I tried to reach pt on [phone] but could not lmom recording kept repeating could not hear message.

## 2014-01-24 NOTE — Telephone Encounter (Signed)
lmom x2 labs work ok, creatinine stable. Continue current treatment plan. Lmom [phone] if any questions

## 2014-01-27 ENCOUNTER — Other Ambulatory Visit (INDEPENDENT_AMBULATORY_CARE_PROVIDER_SITE_OTHER): Payer: Medicare Other

## 2014-01-27 DIAGNOSIS — R609 Edema, unspecified: Secondary | ICD-10-CM

## 2014-01-27 DIAGNOSIS — R0602 Shortness of breath: Secondary | ICD-10-CM

## 2014-01-27 LAB — BASIC METABOLIC PANEL
BUN: 31 mg/dL — AB (ref 6–23)
CALCIUM: 8.3 mg/dL — AB (ref 8.4–10.5)
CHLORIDE: 103 meq/L (ref 96–112)
CO2: 28 meq/L (ref 19–32)
CREATININE: 1.9 mg/dL — AB (ref 0.4–1.2)
GFR: 27.2 mL/min — ABNORMAL LOW (ref >60.00)
Glucose, Bld: 241 mg/dL — ABNORMAL HIGH (ref 70–99)
Potassium: 4 mEq/L (ref 3.5–5.1)
Sodium: 139 mEq/L (ref 135–145)

## 2014-01-30 ENCOUNTER — Telehealth: Payer: Self-pay | Admitting: Physician Assistant

## 2014-01-30 NOTE — Telephone Encounter (Signed)
Pt's daughter is returning your call. Please call and advise.

## 2014-01-30 NOTE — Telephone Encounter (Signed)
Pt's daughter is aware of labs results and PA's recommendations.

## 2014-03-19 ENCOUNTER — Encounter (HOSPITAL_COMMUNITY): Payer: Self-pay | Admitting: *Deleted

## 2014-03-19 ENCOUNTER — Emergency Department (HOSPITAL_COMMUNITY)
Admission: EM | Admit: 2014-03-19 | Discharge: 2014-03-20 | Disposition: A | Discharge status: Home / Self Care | Payer: Medicare Other | Attending: Emergency Medicine | Admitting: Emergency Medicine

## 2014-03-19 DIAGNOSIS — Z794 Long term (current) use of insulin: Secondary | ICD-10-CM | POA: Insufficient documentation

## 2014-03-19 DIAGNOSIS — Z91040 Latex allergy status: Secondary | ICD-10-CM | POA: Diagnosis not present

## 2014-03-19 DIAGNOSIS — Z9889 Other specified postprocedural states: Secondary | ICD-10-CM | POA: Diagnosis not present

## 2014-03-19 DIAGNOSIS — E119 Type 2 diabetes mellitus without complications: Secondary | ICD-10-CM | POA: Diagnosis not present

## 2014-03-19 DIAGNOSIS — Z872 Personal history of diseases of the skin and subcutaneous tissue: Secondary | ICD-10-CM | POA: Insufficient documentation

## 2014-03-19 DIAGNOSIS — Z7901 Long term (current) use of anticoagulants: Secondary | ICD-10-CM | POA: Insufficient documentation

## 2014-03-19 DIAGNOSIS — I10 Essential (primary) hypertension: Secondary | ICD-10-CM | POA: Diagnosis not present

## 2014-03-19 DIAGNOSIS — I2510 Atherosclerotic heart disease of native coronary artery without angina pectoris: Secondary | ICD-10-CM | POA: Diagnosis not present

## 2014-03-19 DIAGNOSIS — N390 Urinary tract infection, site not specified: Secondary | ICD-10-CM | POA: Diagnosis not present

## 2014-03-19 DIAGNOSIS — Z79899 Other long term (current) drug therapy: Secondary | ICD-10-CM | POA: Diagnosis not present

## 2014-03-19 DIAGNOSIS — R21 Rash and other nonspecific skin eruption: Secondary | ICD-10-CM | POA: Diagnosis present

## 2014-03-19 DIAGNOSIS — E785 Hyperlipidemia, unspecified: Secondary | ICD-10-CM | POA: Insufficient documentation

## 2014-03-19 DIAGNOSIS — K219 Gastro-esophageal reflux disease without esophagitis: Secondary | ICD-10-CM | POA: Diagnosis not present

## 2014-03-19 DIAGNOSIS — I739 Peripheral vascular disease, unspecified: Secondary | ICD-10-CM | POA: Diagnosis not present

## 2014-03-19 DIAGNOSIS — M1990 Unspecified osteoarthritis, unspecified site: Secondary | ICD-10-CM | POA: Diagnosis not present

## 2014-03-19 DIAGNOSIS — Z86718 Personal history of other venous thrombosis and embolism: Secondary | ICD-10-CM | POA: Diagnosis not present

## 2014-03-19 DIAGNOSIS — Z7951 Long term (current) use of inhaled steroids: Secondary | ICD-10-CM | POA: Diagnosis not present

## 2014-03-19 DIAGNOSIS — Z8669 Personal history of other diseases of the nervous system and sense organs: Secondary | ICD-10-CM | POA: Diagnosis not present

## 2014-03-19 DIAGNOSIS — Z86711 Personal history of pulmonary embolism: Secondary | ICD-10-CM | POA: Diagnosis not present

## 2014-03-19 DIAGNOSIS — I4891 Unspecified atrial fibrillation: Secondary | ICD-10-CM | POA: Diagnosis not present

## 2014-03-19 DIAGNOSIS — Z9851 Tubal ligation status: Secondary | ICD-10-CM | POA: Insufficient documentation

## 2014-03-19 DIAGNOSIS — Z9049 Acquired absence of other specified parts of digestive tract: Secondary | ICD-10-CM | POA: Diagnosis not present

## 2014-03-19 LAB — URINALYSIS, ROUTINE W REFLEX MICROSCOPIC
BILIRUBIN URINE: NEGATIVE
Glucose, UA: NEGATIVE mg/dL
KETONES UR: NEGATIVE mg/dL
Nitrite: POSITIVE — AB
PH: 5.5 (ref 5.0–8.0)
Protein, ur: 30 mg/dL — AB
Specific Gravity, Urine: 1.01 (ref 1.005–1.030)
Urobilinogen, UA: 0.2 mg/dL (ref 0.0–1.0)

## 2014-03-19 LAB — COMPREHENSIVE METABOLIC PANEL
ALBUMIN: 3.1 g/dL — AB (ref 3.5–5.2)
ALT: 6 U/L (ref 0–35)
ANION GAP: 13 (ref 5–15)
AST: 10 U/L (ref 0–37)
Alkaline Phosphatase: 116 U/L (ref 39–117)
BUN: 40 mg/dL — AB (ref 6–23)
CHLORIDE: 103 meq/L (ref 96–112)
CO2: 26 mEq/L (ref 19–32)
Calcium: 8.6 mg/dL (ref 8.4–10.5)
Creatinine, Ser: 1.7 mg/dL — ABNORMAL HIGH (ref 0.50–1.10)
GFR calc non Af Amer: 26 mL/min — ABNORMAL LOW (ref >90)
GFR, EST AFRICAN AMERICAN: 30 mL/min — AB (ref >90)
GLUCOSE: 231 mg/dL — AB (ref 70–99)
Potassium: 4.8 mEq/L (ref 3.7–5.3)
SODIUM: 142 meq/L (ref 137–147)
Total Protein: 7.2 g/dL (ref 6.0–8.3)

## 2014-03-19 LAB — CBC WITH DIFFERENTIAL/PLATELET
Basophils Absolute: 0 10*3/uL (ref 0.0–0.1)
Basophils Relative: 0 % (ref 0–1)
Eosinophils Absolute: 0.4 10*3/uL (ref 0.0–0.7)
Eosinophils Relative: 4 % (ref 0–5)
HCT: 32.6 % — ABNORMAL LOW (ref 36.0–46.0)
HEMOGLOBIN: 10.2 g/dL — AB (ref 12.0–15.0)
Lymphocytes Relative: 18 % (ref 12–46)
Lymphs Abs: 2 10*3/uL (ref 0.7–4.0)
MCH: 26.7 pg (ref 26.0–34.0)
MCHC: 31.3 g/dL (ref 30.0–36.0)
MCV: 85.3 fL (ref 78.0–100.0)
MONOS PCT: 9 % (ref 3–12)
Monocytes Absolute: 1 10*3/uL (ref 0.1–1.0)
NEUTROS ABS: 7.8 10*3/uL — AB (ref 1.7–7.7)
Neutrophils Relative %: 69 % (ref 43–77)
Platelets: 289 10*3/uL (ref 150–400)
RBC: 3.82 MIL/uL — ABNORMAL LOW (ref 3.87–5.11)
RDW: 14.3 % (ref 11.5–15.5)
WBC: 11.3 10*3/uL — AB (ref 4.0–10.5)

## 2014-03-19 LAB — URINE MICROSCOPIC-ADD ON

## 2014-03-19 LAB — PROTIME-INR
INR: 3.38 — AB (ref 0.00–1.49)
PROTHROMBIN TIME: 34.5 s — AB (ref 11.6–15.2)

## 2014-03-19 LAB — I-STAT CG4 LACTIC ACID, ED: Lactic Acid, Venous: 1.09 mmol/L (ref 0.5–2.2)

## 2014-03-19 MED ORDER — CIPROFLOXACIN HCL 500 MG PO TABS: 500.0000 mg | ORAL_TABLET | Freq: Two times a day (BID) | ORAL | Status: AC

## 2014-03-19 MED ORDER — CIPROFLOXACIN IN D5W 400 MG/200ML IV SOLN
400.0000 mg | Freq: Once | INTRAVENOUS | Status: AC
  Administered 2014-03-19: 400 mg via INTRAVENOUS
  Filled 2014-03-19: qty 200

## 2014-03-19 MED ORDER — FUROSEMIDE 20 MG PO TABS: 20.0000 mg | ORAL_TABLET | Freq: Two times a day (BID) | ORAL | Status: AC

## 2014-03-19 NOTE — ED Notes (Signed)
EMS reports pt noted to have blood around supra pubic cath this am. Area cleaned and continues to have blood around area. Also c/o ? cellulitis below and around knees.

## 2014-03-19 NOTE — Discharge Instructions (Signed)
Peripheral Vascular Disease Peripheral Vascular Disease (PVD), also called Peripheral Arterial Disease (PAD), is a circulation problem caused by cholesterol (atherosclerotic plaque) deposits in the arteries. PVD commonly occurs in the lower extremities (legs) but it can occur in other areas of the body, such as your arms. The cholesterol buildup in the arteries reduces blood flow which can cause pain and other serious problems. The presence of PVD can place a person at risk for Coronary Artery Disease (CAD).  CAUSES  Causes of PVD can be many. It is usually associated with more than one risk factor such as:   High Cholesterol.  Smoking.  Diabetes.  Lack of exercise or inactivity.  High blood pressure (hypertension).  Obesity.  Family history. SYMPTOMS   When the lower extremities are affected, patients with PVD may experience:  Leg pain with exertion or physical activity. This is called INTERMITTENT CLAUDICATION. This may present as cramping or numbness with physical activity. The location of the pain is associated with the level of blockage. For example, blockage at the abdominal level (distal abdominal aorta) may result in buttock or hip pain. Lower leg arterial blockage may result in calf pain.  As PVD becomes more severe, pain can develop with less physical activity.  In people with severe PVD, leg pain may occur at rest.  Other PVD signs and symptoms:  Leg numbness or weakness.  Coldness in the affected leg or foot, especially when compared to the other leg.  A change in leg color.  Patients with significant PVD are more prone to ulcers or sores on toes, feet or legs. These may take longer to heal or may reoccur. The ulcers or sores can become infected.  If signs and symptoms of PVD are ignored, gangrene may occur. This can result in the loss of toes or loss of an entire limb.  Not all leg pain is related to PVD. Other medical conditions can cause leg pain such  as:  Blood clots (embolism) or Deep Vein Thrombosis.  Inflammation of the blood vessels (vasculitis).  Spinal stenosis. DIAGNOSIS  Diagnosis of PVD can involve several different types of tests. These can include:  Pulse Volume Recording Method (PVR). This test is simple, painless and does not involve the use of X-rays. PVR involves measuring and comparing the blood pressure in the arms and legs. An ABI (Ankle-Brachial Index) is calculated. The normal ratio of blood pressures is 1. As this number becomes smaller, it indicates more severe disease.  < 0.95 - indicates significant narrowing in one or more leg vessels.  <0.8 - there will usually be pain in the foot, leg or buttock with exercise.  <0.4 - will usually have pain in the legs at rest.  <0.25 - usually indicates limb threatening PVD.  Doppler detection of pulses in the legs. This test is painless and checks to see if you have a pulses in your legs/feet.  A dye or contrast material (a substance that highlights the blood vessels so they show up on x-ray) may be given to help your caregiver better see the arteries for the following tests. The dye is eliminated from your body by the kidney's. Your caregiver may order blood work to check your kidney function and other laboratory values before the following tests are performed:  Magnetic Resonance Angiography (MRA). An MRA is a picture study of the blood vessels and arteries. The MRA machine uses a large magnet to produce images of the blood vessels.  Computed Tomography Angiography (CTA). A CTA  is a specialized x-ray that looks at how the blood flows in your blood vessels. An IV may be inserted into your arm so contrast dye can be injected.  Angiogram. Is a procedure that uses x-rays to look at your blood vessels. This procedure is minimally invasive, meaning a small incision (cut) is made in your groin. A small tube (catheter) is then inserted into the artery of your groin. The catheter  is guided to the blood vessel or artery your caregiver wants to examine. Contrast dye is injected into the catheter. X-rays are then taken of the blood vessel or artery. After the images are obtained, the catheter is taken out. TREATMENT  Treatment of PVD involves many interventions which may include:  Lifestyle changes:  Quitting smoking.  Exercise.  Following a low fat, low cholesterol diet.  Control of diabetes.  Foot care is very important to the PVD patient. Good foot care can help prevent infection.  Medication:  Cholesterol-lowering medicine.  Blood pressure medicine.  Anti-platelet drugs.  Certain medicines may reduce symptoms of Intermittent Claudication.  Interventional/Surgical options:  Angioplasty. An Angioplasty is a procedure that inflates a balloon in the blocked artery. This opens the blocked artery to improve blood flow.  Stent Implant. A wire mesh tube (stent) is placed in the artery. The stent expands and stays in place, allowing the artery to remain open.  Peripheral Bypass Surgery. This is a surgical procedure that reroutes the blood around a blocked artery to help improve blood flow. This type of procedure may be performed if Angioplasty or stent implants are not an option. SEEK IMMEDIATE MEDICAL CARE IF:   You develop pain or numbness in your arms or legs.  Your arm or leg turns cold, becomes blue in color.  You develop redness, warmth, swelling and pain in your arms or legs. MAKE SURE YOU:   Understand these instructions.  Will watch your condition.  Will get help right away if you are not doing well or get worse. Document Released: 06/12/2004 Document Revised: 07/28/2011 Document Reviewed: 05/09/2008 [facility] Patient Information 2015 [place], [place]. This information is not intended to replace advice given to you by your health care provider. Make sure you discuss any questions you have with your health care provider. Urinary Tract  Infection Urinary tract infections (UTIs) can develop anywhere along your urinary tract. Your urinary tract is your body's drainage system for removing wastes and extra water. Your urinary tract includes two kidneys, two ureters, a bladder, and a urethra. Your kidneys are a pair of bean-shaped organs. Each kidney is about the size of your fist. They are located below your ribs, one on each side of your spine. CAUSES Infections are caused by microbes, which are microscopic organisms, including fungi, viruses, and bacteria. These organisms are so small that they can only be seen through a microscope. Bacteria are the microbes that most commonly cause UTIs. SYMPTOMS  Symptoms of UTIs may vary by age and gender of the patient and by the location of the infection. Symptoms in young women typically include a frequent and intense urge to urinate and a painful, burning feeling in the bladder or urethra during urination. Older women and men are more likely to be tired, shaky, and weak and have muscle aches and abdominal pain. A fever may mean the infection is in your kidneys. Other symptoms of a kidney infection include pain in your back or sides below the ribs, nausea, and vomiting. DIAGNOSIS To diagnose a UTI, your caregiver will  ask you about your symptoms. Your caregiver also will ask to provide a urine sample. The urine sample will be tested for bacteria and white blood cells. White blood cells are made by your body to help fight infection. TREATMENT  Typically, UTIs can be treated with medication. Because most UTIs are caused by a bacterial infection, they usually can be treated with the use of antibiotics. The choice of antibiotic and length of treatment depend on your symptoms and the type of bacteria causing your infection. HOME CARE INSTRUCTIONS  If you were prescribed antibiotics, take them exactly as your caregiver instructs you. Finish the medication even if you feel better after you have only taken  some of the medication.  Drink enough water and fluids to keep your urine clear or pale yellow.  Avoid caffeine, tea, and carbonated beverages. They tend to irritate your bladder.  Empty your bladder often. Avoid holding urine for long periods of time.  Empty your bladder before and after sexual intercourse.  After a bowel movement, women should cleanse from front to back. Use each tissue only once. SEEK MEDICAL CARE IF:   You have back pain.  You develop a fever.  Your symptoms do not begin to resolve within 3 days. SEEK IMMEDIATE MEDICAL CARE IF:   You have severe back pain or lower abdominal pain.  You develop chills.  You have nausea or vomiting.  You have continued burning or discomfort with urination. MAKE SURE YOU:   Understand these instructions.  Will watch your condition.  Will get help right away if you are not doing well or get worse. Document Released: 02/12/2005 Document Revised: 11/04/2011 Document Reviewed: 06/13/2011 [facility] Patient Information 2015 [place], [place]. This information is not intended to replace advice given to you by your health care provider. Make sure you discuss any questions you have with your health care provider.

## 2014-03-19 NOTE — ED Provider Notes (Signed)
CSN: [id number]     Arrival date & time 03/19/14  1746 History   First MD Initiated Contact with Patient 03/19/14 1838     Chief Complaint  Patient presents with  . supra pubic cath problems      (Consider location/radiation/quality/duration/timing/severity/associated sxs/prior Treatment) HPI Comments: Patient presents emergency department with chief complaints of having bleeding around suprapubic catheter insertion site. She states the bleeding occurred this morning. She thinks that she might have stepped on the catheter tubing. She denies pain in the area. She states that she has still noticed a small amount of bleeding. She is taking Coumadin. Patient also complains of lower extremity swelling.she denies any chest pain, shortness of breath, abdominal pain, nausea, vomiting, or diarrhea. She has not tried anything to alleviate his symptoms. She is here with her family members, who are mainly concerned about the bleeding from the catheter site.  The history is provided by the patient. No language interpreter was used.    Past Medical History  Diagnosis Date  . Hypertension   . GERD (gastroesophageal reflux disease)   . Diabetes mellitus   . History of atrial fibrillation     very remote hx (many years ago)  . Urinary retention     s/p suprapubic catheter November 2013  . Coronary artery disease     LHC (5/06):  Mid LAD 20%, EF 60%  . Foley catheter in place   . Edema of lower extremity BILATERAL LEG  . Hyperlipidemia   . History of pulmonary embolus (PE) 2010--  TAKES COUMADIN  . History of DVT of lower extremity   . Anticoagulated on Coumadin   . Generalized weakness AGE RELATED--  USES WALKER  . Vertigo   . OA (osteoarthritis)   . Thin skin FRAGILE  . Hypothyroidism NO MEDS  . Impaired hearing BILATERAL AIDS  . Chronic diarrhea INTERMITTANT  . Hx of echocardiogram     Echo (8/15):  EF 55-60%, no RWMA, PASP 36 mmHg   Past Surgical History  Procedure Laterality Date  .  Knee surgery    . Tubal ligation    . Cardiac catheterization  09-27-2004  DR TENNENT    NORMAL LVF/ MILD IRREGULARITIES IN MID LAD W/ NO SIGNIFICANT OBSTRUCTIVE DISEASE  . Abdominal hysterectomy  1994  (APPROX)    W/ BILATERAL SALPINGO-OOPHORECTOMY  . Cholecystectomy  2000  . Knee arthroscopy  1998  (APPROX)  . Bladder suspension  2002  (APPROX)    W/ ANTERIOR AND POSTERIOR REPAIR  . Cataract extraction w/ intraocular lens  implant, bilateral    . Transthoracic echocardiogram  02-23-2009  (DX W/ PE & PAF)    EF 65-70%/ MILD MR/ MODERATE TR/ MILDLY DILATED RA  &  LA/  RV SYSTOLIC FUNCTION MODERATLY REDUCED   . Insertion of suprapubic catheter  04/02/2012    Procedure: INSERTION OF SUPRAPUBIC CATHETER;  Surgeon: Garnett FarmMark C Ottelin, MD;  [facility] [place];  Service: Urology;  Laterality: N/A;  SUPRAPUBIC TUBE PLACEMENT  . Cystoscopy  04/02/2012    Procedure: CYSTOSCOPY;  Surgeon: Garnett FarmMark C Ottelin, MD;  [facility] [place];  Service: Urology;;   Family History  Problem Relation Age of Onset  . Heart disease Mother   . Hypertension Mother   . Heart attack Father   . Hypertension Father   . Stroke Mother   . Diabetes Father   . Cancer Sister    History  Substance Use Topics  . Smoking status: Never Smoker   .  Smokeless tobacco: Current User    Types: Snuff     Comment: DIPPED TOBACCO SINCE AGE 24  . Alcohol Use: No   OB History    No data available     Review of Systems  Constitutional: Negative for fever and chills.  Respiratory: Negative for shortness of breath.   Cardiovascular: Negative for chest pain.  Gastrointestinal: Negative for nausea, vomiting, diarrhea and constipation.  Genitourinary: Negative for dysuria.  Skin: Positive for color change and rash.  All other systems reviewed and are negative.     Allergies  Humibid la; Latex; Detrol; Keflex; and Vioxx  Home Medications   Prior to Admission medications   Medication Sig  Start Date End Date Taking? Authorizing Provider  acetaminophen (TYLENOL) 500 MG tablet Take 500-1,000 mg by mouth every 6 (six) hours as needed (for pain).    Historical Provider, MD  ALPRAZolam Prudy Feeler) 0.25 MG tablet Take 0.5-1 tablets (0.125-0.25 mg total) by mouth 2 (two) times daily as needed for sleep or anxiety (0.5-1 tablet). 09/01/13   Esperanza Sheets, MD  bumetanide (BUMEX) 2 MG tablet Take 1 tablet (2 mg total) by mouth 2 (two) times daily. 01/16/14   Beatrice Lecher, PA-C  cetirizine (ZYRTEC) 10 MG tablet TAKE 1 TABLET BY MOUTH EVERY DAY 11/30/13   Historical Provider, MD  diltiazem (CARDIZEM) 120 MG tablet Take 120 mg by mouth 2 (two) times daily.    Historical Provider, MD  ferrous sulfate 325 (65 FE) MG tablet Take 325 mg by mouth at bedtime.     Historical Provider, MD  Fluticasone-Salmeterol (ADVAIR) 250-50 MCG/DOSE AEPB Inhale 1 puff into the lungs 2 (two) times daily as needed (shortness of breath).     Historical Provider, MD  insulin glargine (LANTUS) 100 UNIT/ML injection Inject 0.2 mLs (20 Units total) into the skin at bedtime. 09/01/13   Esperanza Sheets, MD  lidocaine (LIDODERM) 5 % Place 1 patch onto the skin as needed. Applied to back. Remove & Discard patch within 12 hours or as directed by MD    Historical Provider, MD  losartan (COZAAR) 100 MG tablet Take 0.5 tablets (50 mg total) by mouth daily. 09/01/13   Esperanza Sheets, MD  methimazole (TAPAZOLE) 10 MG tablet Take 5 mg by mouth daily.    Historical Provider, MD  metoCLOPramide (REGLAN) 5 MG tablet Take 2.5 mg by mouth 2 (two) times daily.     Historical Provider, MD  mirabegron ER (MYRBETRIQ) 25 MG TB24 tablet Take 25 mg by mouth daily.    Historical Provider, MD  mirtazapine (REMERON) 15 MG tablet Take 15 mg by mouth at bedtime.    Historical Provider, MD  Polyethyl Glycol-Propyl Glycol (SYSTANE OP) Apply 1 drop to eye daily as needed (dry eye).    Historical Provider, MD  potassium chloride SA (K-DUR,KLOR-CON) 20 MEQ  tablet Take 0.5 tablets (10 mEq total) by mouth daily. 09/01/13   Esperanza Sheets, MD  pravastatin (PRAVACHOL) 20 MG tablet Take 20 mg by mouth daily.     Historical Provider, MD  senna-docusate (SENOKOT-S) 8.6-50 MG per tablet Take 1 tablet by mouth 2 (two) times daily. 09/01/13   Esperanza Sheets, MD  sitaGLIPtin (JANUVIA) 50 MG tablet Take 50 mg by mouth daily.    Historical Provider, MD  sodium phosphate (FLEET) 7-19 GM/118ML ENEM Place 133 mLs (1 enema total) rectally daily as needed for severe constipation. 09/01/13   Esperanza Sheets, MD  vitamin B-12 (CYANOCOBALAMIN) 1000 MCG tablet  Take 1,000 mcg by mouth daily.    Historical Provider, MD  warfarin (COUMADIN) 2 MG tablet Take 2-3 mg by mouth daily. 2 mg on sun, tue, thur and 3 mg all other days    Historical Provider, MD   BP 134/55 mmHg  Pulse 91  Temp(Src) 98.2 F (36.8 C) (Oral)  SpO2 95% Physical Exam  Constitutional: She is oriented to person, place, and time. She appears well-developed and well-nourished.  HENT:  Head: Normocephalic and atraumatic.  Eyes: Conjunctivae and EOM are normal. Pupils are equal, round, and reactive to light.  Neck: Normal range of motion. Neck supple.  Cardiovascular: Normal rate and regular rhythm.  Exam reveals no gallop and no friction rub.   No murmur heard. Pulmonary/Chest: Effort normal and breath sounds normal. No respiratory distress. She has no wheezes. She has no rales. She exhibits no tenderness.  Abdominal: Soft. Bowel sounds are normal. She exhibits no distension and no mass. There is no tenderness. There is no rebound and no guarding.  Genitourinary:  Suprapubic catheter in place, scant amount of blood around the insertion, no active bleeding, no evidence of infection  Musculoskeletal: Normal range of motion. She exhibits edema. She exhibits no tenderness.  2+ pitting edema  Neurological: She is alert and oriented to person, place, and time.  Skin: Skin is warm and dry.  Bilateral  lower extremities remarkable for moderate swelling, with signs of peripheral vascular disease versus cellulitis  Psychiatric:  Mildly demented  Nursing note and vitals reviewed.   ED Course  Procedures (including critical care time) Results for orders placed or performed during the hospital encounter of 03/19/14  CBC with Differential  Result Value Ref Range   WBC 11.3 (H) 4.0 - 10.5 K/uL   RBC 3.82 (L) 3.87 - 5.11 MIL/uL   Hemoglobin 10.2 (L) 12.0 - 15.0 g/dL   HCT 51.832.6 (L) 84.136.0 - 66.046.0 %   MCV 85.3 78.0 - 100.0 fL   MCH 26.7 26.0 - 34.0 pg   MCHC 31.3 30.0 - 36.0 g/dL   RDW 63.014.3 16.011.5 - 10.915.5 %   Platelets 289 150 - 400 K/uL   Neutrophils Relative % 69 43 - 77 %   Neutro Abs 7.8 (H) 1.7 - 7.7 K/uL   Lymphocytes Relative 18 12 - 46 %   Lymphs Abs 2.0 0.7 - 4.0 K/uL   Monocytes Relative 9 3 - 12 %   Monocytes Absolute 1.0 0.1 - 1.0 K/uL   Eosinophils Relative 4 0 - 5 %   Eosinophils Absolute 0.4 0.0 - 0.7 K/uL   Basophils Relative 0 0 - 1 %   Basophils Absolute 0.0 0.0 - 0.1 K/uL  Comprehensive metabolic panel  Result Value Ref Range   Sodium 142 137 - 147 mEq/L   Potassium 4.8 3.7 - 5.3 mEq/L   Chloride 103 96 - 112 mEq/L   CO2 26 19 - 32 mEq/L   Glucose, Bld 231 (H) 70 - 99 mg/dL   BUN 40 (H) 6 - 23 mg/dL   Creatinine, Ser 3.231.70 (H) 0.50 - 1.10 mg/dL   Calcium 8.6 8.4 - 55.710.5 mg/dL   Total Protein 7.2 6.0 - 8.3 g/dL   Albumin 3.1 (L) 3.5 - 5.2 g/dL   AST 10 0 - 37 U/L   ALT 6 0 - 35 U/L   Alkaline Phosphatase 116 39 - 117 U/L   Total Bilirubin <0.2 (L) 0.3 - 1.2 mg/dL   GFR calc non Af Amer 26 (L) >90  mL/min   GFR calc Af Amer 30 (L) >90 mL/min   Anion gap 13 5 - 15  Urinalysis, Routine w reflex microscopic  Result Value Ref Range   Color, Urine YELLOW YELLOW   APPearance CLOUDY (A) CLEAR   Specific Gravity, Urine 1.010 1.005 - 1.030   pH 5.5 5.0 - 8.0   Glucose, UA NEGATIVE NEGATIVE mg/dL   Hgb urine dipstick MODERATE (A) NEGATIVE   Bilirubin Urine NEGATIVE  NEGATIVE   Ketones, ur NEGATIVE NEGATIVE mg/dL   Protein, ur 30 (A) NEGATIVE mg/dL   Urobilinogen, UA 0.2 0.0 - 1.0 mg/dL   Nitrite POSITIVE (A) NEGATIVE   Leukocytes, UA LARGE (A) NEGATIVE  Protime-INR  Result Value Ref Range   Prothrombin Time 34.5 (H) 11.6 - 15.2 seconds   INR 3.38 (H) 0.00 - 1.49  Urine microscopic-add on  Result Value Ref Range   Squamous Epithelial / LPF RARE RARE   WBC, UA TOO NUMEROUS TO COUNT <3 WBC/hpf   RBC / HPF 7-10 <3 RBC/hpf   Bacteria, UA MANY (A) RARE   Casts HYALINE CASTS (A) NEGATIVE  I-Stat CG4 Lactic Acid, ED  Result Value Ref Range   Lactic Acid, Venous 1.09 0.5 - 2.2 mmol/L   No results found.   Imaging Review No results found.   EKG Interpretation None      MDM   Final diagnoses:  UTI (lower urinary tract infection)  Peripheral vascular disease    Patient with catheter problem and lower extremity swelling.  Patient seen by and discussed with Dr. Micheline Maze.  No bleeding from the catheter site.  It is possible that the patient stepped on the catheter tubing, but no evidence of traumatic injury now.  Catheter is working properly.  Dr. Micheline Maze recommends treatment of UTI with Cipro.  Will give some lasix for a few days to help pull off additional lower extremity fluid, but will recommend close follow-up.  Hold one dose of coumadin.  Plan discussed with patient, family, and caregiver.  DC to home.    Roxy Horseman, PA-C 03/20/14 [id number]

## 2014-03-19 NOTE — ED Notes (Signed)
While attempting to irrigate suprapubic cath this writer instilled 30 ml's x 3 all of which drained back out however was unable to pull any additional urine out.  Pt's urine is cloudy and malodorous.  Bladder scanned pt 6750ml's noted in bladder.

## 2014-03-28 ENCOUNTER — Ambulatory Visit: Payer: Medicare Other | Admitting: Cardiovascular Disease

## 2014-04-17 ENCOUNTER — Ambulatory Visit: Payer: Medicare Other | Admitting: Physician Assistant

## 2014-04-20 ENCOUNTER — Ambulatory Visit: Payer: Medicare Other | Admitting: Cardiovascular Disease

## 2014-04-21 ENCOUNTER — Ambulatory Visit: Payer: Medicare Other | Admitting: Cardiology

## 2014-06-08 ENCOUNTER — Other Ambulatory Visit: Payer: Self-pay | Admitting: Physician Assistant

## 2014-06-13 ENCOUNTER — Ambulatory Visit: Payer: Medicare Other | Admitting: Nurse Practitioner

## 2014-07-16 ENCOUNTER — Emergency Department (HOSPITAL_COMMUNITY)
Admission: EM | Admit: 2014-07-16 | Discharge: 2014-07-16 | Disposition: A | Discharge status: Home / Self Care | Payer: Medicare Other | Attending: Emergency Medicine | Admitting: Emergency Medicine

## 2014-07-16 ENCOUNTER — Encounter (HOSPITAL_COMMUNITY): Payer: Self-pay

## 2014-07-16 DIAGNOSIS — E039 Hypothyroidism, unspecified: Secondary | ICD-10-CM | POA: Diagnosis not present

## 2014-07-16 DIAGNOSIS — Z872 Personal history of diseases of the skin and subcutaneous tissue: Secondary | ICD-10-CM | POA: Diagnosis not present

## 2014-07-16 DIAGNOSIS — I10 Essential (primary) hypertension: Secondary | ICD-10-CM | POA: Insufficient documentation

## 2014-07-16 DIAGNOSIS — I2510 Atherosclerotic heart disease of native coronary artery without angina pectoris: Secondary | ICD-10-CM | POA: Insufficient documentation

## 2014-07-16 DIAGNOSIS — Z7901 Long term (current) use of anticoagulants: Secondary | ICD-10-CM | POA: Diagnosis not present

## 2014-07-16 DIAGNOSIS — M1990 Unspecified osteoarthritis, unspecified site: Secondary | ICD-10-CM | POA: Diagnosis not present

## 2014-07-16 DIAGNOSIS — E785 Hyperlipidemia, unspecified: Secondary | ICD-10-CM | POA: Insufficient documentation

## 2014-07-16 DIAGNOSIS — Z794 Long term (current) use of insulin: Secondary | ICD-10-CM | POA: Diagnosis not present

## 2014-07-16 DIAGNOSIS — Z79899 Other long term (current) drug therapy: Secondary | ICD-10-CM | POA: Insufficient documentation

## 2014-07-16 DIAGNOSIS — K219 Gastro-esophageal reflux disease without esophagitis: Secondary | ICD-10-CM | POA: Insufficient documentation

## 2014-07-16 DIAGNOSIS — Y846 Urinary catheterization as the cause of abnormal reaction of the patient, or of later complication, without mention of misadventure at the time of the procedure: Secondary | ICD-10-CM | POA: Diagnosis not present

## 2014-07-16 DIAGNOSIS — F0390 Unspecified dementia without behavioral disturbance: Secondary | ICD-10-CM | POA: Diagnosis not present

## 2014-07-16 DIAGNOSIS — T83030A Leakage of cystostomy catheter, initial encounter: Secondary | ICD-10-CM | POA: Insufficient documentation

## 2014-07-16 DIAGNOSIS — E119 Type 2 diabetes mellitus without complications: Secondary | ICD-10-CM | POA: Insufficient documentation

## 2014-07-16 DIAGNOSIS — Z86711 Personal history of pulmonary embolism: Secondary | ICD-10-CM | POA: Insufficient documentation

## 2014-07-16 DIAGNOSIS — T83090A Other mechanical complication of cystostomy catheter, initial encounter: Secondary | ICD-10-CM

## 2014-07-16 HISTORY — DX: Unspecified dementia, unspecified severity, without behavioral disturbance, psychotic disturbance, mood disturbance, and anxiety: F03.90

## 2014-07-16 MED ORDER — CIPROFLOXACIN HCL 500 MG PO TABS
500.0000 mg | ORAL_TABLET | Freq: Once | ORAL | Status: AC
  Administered 2014-07-16: 500 mg via ORAL
  Filled 2014-07-16: qty 1

## 2014-07-16 NOTE — ED Notes (Signed)
Per [facility]- Pt resides at home with outside assistance. NO PAPERWORK ON CODE STATUS. Daughter came to visit this am and mother states "she felt wet all over." Daughter found catheter was displaced. Unsure when it came dislodged. Pt without any other complaints.

## 2014-07-16 NOTE — ED Notes (Signed)
EDP WICKLINE ATTEMPTED SUPRAPUBIC/FOLEY PER UROLOGY RECOMMENDATIONS WITHOUT SUCCESS. DAUGHTER UPDATED

## 2014-07-16 NOTE — Consult Note (Signed)
Consultation: Suprapubic tube placement Referred by: Dr. Lynelle DoctorKnapp  History of Present Illness: Patient is an 81 year old female who sees Dr. Vernie Ammonsttelin.  She's had a suprapubic tube since November 2013 for urinary retention.He was in our office 2 days ago and the suprapubic tube was changed.  Unfortunately it fell out last night and emergency department staff/M.D. could not replace it.  Urology was consulted.  Past Medical History  Diagnosis Date  . Hypertension   . GERD (gastroesophageal reflux disease)   . Diabetes mellitus   . History of atrial fibrillation     very remote hx (many years ago)  . Urinary retention     s/p suprapubic catheter November 2013  . Coronary artery disease     LHC (5/06):  Mid LAD 20%, EF 60%  . Foley catheter in place   . Edema of lower extremity BILATERAL LEG  . Hyperlipidemia   . History of pulmonary embolus (PE) 2010--  TAKES COUMADIN  . History of DVT of lower extremity   . Anticoagulated on Coumadin   . Generalized weakness AGE RELATED--  USES WALKER  . Vertigo   . OA (osteoarthritis)   . Thin skin FRAGILE  . Hypothyroidism NO MEDS  . Impaired hearing BILATERAL AIDS  . Chronic diarrhea INTERMITTANT  . Hx of echocardiogram     Echo (8/15):  EF 55-60%, no RWMA, PASP 36 mmHg  . Dementia    Past Surgical History  Procedure Laterality Date  . Knee surgery    . Tubal ligation    . Cardiac catheterization  09-27-2004  DR TENNENT    NORMAL LVF/ MILD IRREGULARITIES IN MID LAD W/ NO SIGNIFICANT OBSTRUCTIVE DISEASE  . Abdominal hysterectomy  1994  (APPROX)    W/ BILATERAL SALPINGO-OOPHORECTOMY  . Cholecystectomy  2000  . Knee arthroscopy  1998  (APPROX)  . Bladder suspension  2002  (APPROX)    W/ ANTERIOR AND POSTERIOR REPAIR  . Cataract extraction w/ intraocular lens  implant, bilateral    . Transthoracic echocardiogram  02-23-2009  (DX W/ PE & PAF)    EF 65-70%/ MILD MR/ MODERATE TR/ MILDLY DILATED RA  &  LA/  RV SYSTOLIC FUNCTION MODERATLY REDUCED    . Insertion of suprapubic catheter  04/02/2012    Procedure: INSERTION OF SUPRAPUBIC CATHETER;  Surgeon: Garnett FarmMark C Ottelin, MD;  [facility] [place];  Service: Urology;  Laterality: N/A;  SUPRAPUBIC TUBE PLACEMENT  . Cystoscopy  04/02/2012    Procedure: CYSTOSCOPY;  Surgeon: Garnett FarmMark C Ottelin, MD;  [facility] [place];  Service: Urology;;    Home Medications:   (Not in a hospital admission) Allergies:  Allergies  Allergen Reactions  . Humibid La [Guaifenesin] Shortness Of Breath  . Latex Hives  . Detrol [Tolterodine Tartrate] Hives  . Keflex [Cephalexin]     hives  . Vioxx [Rofecoxib] Hives    Family History  Problem Relation Age of Onset  . Heart disease Mother   . Hypertension Mother   . Heart attack Father   . Hypertension Father   . Stroke Mother   . Diabetes Father   . Cancer Sister    Social History:  reports that she has never smoked. Her smokeless tobacco use includes Snuff. She reports that she does not drink alcohol or use illicit drugs.  ROS: A complete review of systems was performed.  All systems are negative except for pertinent findings as noted. Review of Systems  All other systems reviewed and are negative.  Physical Exam:  Vital signs in last 24 hours: Temp:  [98 F (36.7 C)-98.9 F (37.2 C)] 98.9 F (37.2 C) (02/28 1336) Pulse Rate:  [73-79] 73 (02/28 1715) Resp:  [18-22] 18 (02/28 1715) BP: (146-157)/(58-72) 146/71 mmHg (02/28 1715) SpO2:  [94 %-96 %] 95 % (02/28 1715) General:  Alert and oriented, No acute distress HEENT: Normocephalic, atraumatic Neck: No JVD or lymphadenopathy Cardiovascular: Regular rate and rhythm Lungs: Regular rate and effort Abdomen: Soft, nontender, nondistended, no abdominal masses; Obese.;  Prior suprapubic tube site was visible. Back: No CVA tenderness Extremities: No edema Neurologic: Grossly intact  Procedure: Patient's suprapubic cystotomy was prepped with Betadine and I  attempted to pass a 85 [place] and a 14 French silicone catheter multiple times.  Patient has a latex allergy.  The catheter simply would not pass and I did not want to put significant force on it given that there may be a false passage and I did not want to create any trauma or entry into other cavity.   Impression/Assessment/Plan; urinary retention - unable to replace suprapubic tube.  Discussed situation with patient's daughter and we discussed the nature risks and benefits of Foley catheter placement per urethra for attempt at replacing suprapubic tube through existing suprapubic tube tract via cystoscopy.  All questions answered.  Patient and daughter elected to proceed with cystoscopy and suprapubic tube placement via a percutaneous and endoscopic approach.  Marlie Kuennen 07/16/2014, 5:40 PM

## 2014-07-16 NOTE — ED Notes (Signed)
Urologist remains at bedside.

## 2014-07-16 NOTE — ED Provider Notes (Signed)
CSN: [id number]     Arrival date & time 07/16/14  1310 History   First MD Initiated Contact with Patient 07/16/14 1340     Chief Complaint  Patient presents with  . URINARY PROBLEMS     LEAKING SUPRAPUBIC CATH   LEVEL 5 CAVEAT DEMENTIA  The history is provided by the patient and a relative.  Patient presents with urinary difficulty Patient has had suprapubic catheter for 2 years It was recently changed without difficulty This morning it was noted that catheter had displaced but unclear when it occurred No fever/vomiting.  Nothing worsens her symptoms.  Her course is stable  Pt denies any complaints at this time  Past Medical History  Diagnosis Date  . Hypertension   . GERD (gastroesophageal reflux disease)   . Diabetes mellitus   . History of atrial fibrillation     very remote hx (many years ago)  . Urinary retention     s/p suprapubic catheter November 2013  . Coronary artery disease     LHC (5/06):  Mid LAD 20%, EF 60%  . Foley catheter in place   . Edema of lower extremity BILATERAL LEG  . Hyperlipidemia   . History of pulmonary embolus (PE) 2010--  TAKES COUMADIN  . History of DVT of lower extremity   . Anticoagulated on Coumadin   . Generalized weakness AGE RELATED--  USES WALKER  . Vertigo   . OA (osteoarthritis)   . Thin skin FRAGILE  . Hypothyroidism NO MEDS  . Impaired hearing BILATERAL AIDS  . Chronic diarrhea INTERMITTANT  . Hx of echocardiogram     Echo (8/15):  EF 55-60%, no RWMA, PASP 36 mmHg  . Dementia    Past Surgical History  Procedure Laterality Date  . Knee surgery    . Tubal ligation    . Cardiac catheterization  09-27-2004  DR TENNENT    NORMAL LVF/ MILD IRREGULARITIES IN MID LAD W/ NO SIGNIFICANT OBSTRUCTIVE DISEASE  . Abdominal hysterectomy  1994  (APPROX)    W/ BILATERAL SALPINGO-OOPHORECTOMY  . Cholecystectomy  2000  . Knee arthroscopy  1998  (APPROX)  . Bladder suspension  2002  (APPROX)    W/ ANTERIOR AND POSTERIOR REPAIR  .  Cataract extraction w/ intraocular lens  implant, bilateral    . Transthoracic echocardiogram  02-23-2009  (DX W/ PE & PAF)    EF 65-70%/ MILD MR/ MODERATE TR/ MILDLY DILATED RA  &  LA/  RV SYSTOLIC FUNCTION MODERATLY REDUCED   . Insertion of suprapubic catheter  04/02/2012    Procedure: INSERTION OF SUPRAPUBIC CATHETER;  Surgeon: Garnett FarmMark C Ottelin, MD;  [facility] [place];  Service: Urology;  Laterality: N/A;  SUPRAPUBIC TUBE PLACEMENT  . Cystoscopy  04/02/2012    Procedure: CYSTOSCOPY;  Surgeon: Garnett FarmMark C Ottelin, MD;  [facility] [place];  Service: Urology;;   Family History  Problem Relation Age of Onset  . Heart disease Mother   . Hypertension Mother   . Heart attack Father   . Hypertension Father   . Stroke Mother   . Diabetes Father   . Cancer Sister    History  Substance Use Topics  . Smoking status: Never Smoker   . Smokeless tobacco: Current User    Types: Snuff     Comment: DIPPED TOBACCO SINCE AGE 8  . Alcohol Use: No   OB History    No data available     Review of Systems  Unable to perform ROS:  Dementia      Allergies  Humibid la; Latex; Detrol; Keflex; and Vioxx  Home Medications   Prior to Admission medications   Medication Sig Start Date End Date Taking? Authorizing Provider  acetaminophen (TYLENOL) 500 MG tablet Take 500-1,000 mg by mouth every 6 (six) hours as needed (for pain).   Yes Historical Provider, MD  ALPRAZolam (XANAX) 0.25 MG tablet Take 0.5-1 tablets (0.125-0.25 mg total) by mouth 2 (two) times daily as needed for sleep or anxiety (0.5-1 tablet). Patient taking differently: Take 0.25 mg by mouth at bedtime as needed for anxiety or sleep.  09/01/13  Yes Esperanza Sheets, MD  bumetanide (BUMEX) 2 MG tablet Take 1 tablet (2 mg total) by mouth 2 (two) times daily. 01/16/14  Yes Scott Moishe Spice, PA-C  cetirizine (ZYRTEC) 10 MG tablet Take 10 mg by mouth daily.  11/30/13  Yes Historical Provider, MD  diltiazem (MATZIM  LA) 240 MG 24 hr tablet Take 240 mg by mouth every evening.   Yes Historical Provider, MD  diphenhydramine-acetaminophen (TYLENOL PM) 25-500 MG TABS Take 1 tablet by mouth at bedtime as needed (sleep).   Yes Historical Provider, MD  ferrous sulfate 325 (65 FE) MG tablet Take 325 mg by mouth at bedtime.    Yes Historical Provider, MD  Fluticasone-Salmeterol (ADVAIR) 250-50 MCG/DOSE AEPB Inhale 1 puff into the lungs at bedtime as needed (shortness of breath).    Yes Historical Provider, MD  insulin detemir (LEVEMIR) 100 UNIT/ML injection Inject 45 Units into the skin daily with supper.   Yes Historical Provider, MD  lidocaine (LIDODERM) 5 % Place 1 patch onto the skin as needed. Applied to back. Remove & Discard patch within 12 hours or as directed by MD   Yes Historical Provider, MD  losartan (COZAAR) 100 MG tablet Take 0.5 tablets (50 mg total) by mouth daily. Patient taking differently: Take 100 mg by mouth daily.  09/01/13  Yes Esperanza Sheets, MD  methenamine (HIPREX) 1 G tablet Take 1 g by mouth 2 (two) times daily with a meal.   Yes Historical Provider, MD  methimazole (TAPAZOLE) 10 MG tablet Take 5 mg by mouth at bedtime.    Yes Historical Provider, MD  metoCLOPramide (REGLAN) 5 MG tablet Take 2.5 mg by mouth 3 (three) times daily before meals.    Yes Historical Provider, MD  mirabegron ER (MYRBETRIQ) 50 MG TB24 tablet Take 50 mg by mouth daily.   Yes Historical Provider, MD  mirtazapine (REMERON) 15 MG tablet Take 15 mg by mouth at bedtime.   Yes Historical Provider, MD  omeprazole (PRILOSEC) 40 MG capsule Take 40 mg by mouth every evening.   Yes Historical Provider, MD  oxybutynin (DITROPAN) 5 MG tablet Take 5 mg by mouth 3 (three) times daily.   Yes Historical Provider, MD  Polyethyl Glycol-Propyl Glycol (SYSTANE OP) Apply 1 drop to eye daily as needed (dry eye).   Yes Historical Provider, MD  potassium chloride SA (K-DUR,KLOR-CON) 20 MEQ tablet Take 0.5 tablets (10 mEq total) by mouth  daily. Patient taking differently: Take 10 mEq by mouth 2 (two) times daily.  09/01/13  Yes Esperanza Sheets, MD  pravastatin (PRAVACHOL) 20 MG tablet Take 20 mg by mouth every evening.    Yes Historical Provider, MD  sitaGLIPtin (JANUVIA) 50 MG tablet Take 50 mg by mouth daily.   Yes Historical Provider, MD  vitamin B-12 (CYANOCOBALAMIN) 1000 MCG tablet Take 1,000 mcg by mouth daily.   Yes Historical Provider, MD  warfarin (COUMADIN) 2 MG tablet Take 1-2 mg by mouth daily. 2 mg on sun, tue, thur and Friday and 1 mg all other days in the evenings   Yes Historical Provider, MD  ciprofloxacin (CIPRO) 500 MG tablet Take 1 tablet (500 mg total) by mouth 2 (two) times daily. Patient not taking: Reported on 07/16/2014 03/19/14   Roxy Horseman, PA-C  furosemide (LASIX) 20 MG tablet Take 1 tablet (20 mg total) by mouth 2 (two) times daily. Patient not taking: Reported on 07/16/2014 03/19/14   Roxy Horseman, PA-C  insulin glargine (LANTUS) 100 UNIT/ML injection Inject 0.2 mLs (20 Units total) into the skin at bedtime. Patient not taking: Reported on 07/16/2014 09/01/13   Esperanza Sheets, MD  senna-docusate (SENOKOT-S) 8.6-50 MG per tablet Take 1 tablet by mouth 2 (two) times daily. Patient not taking: Reported on 07/16/2014 09/01/13   Esperanza Sheets, MD  sodium phosphate (FLEET) 7-19 GM/118ML ENEM Place 133 mLs (1 enema total) rectally daily as needed for severe constipation. Patient not taking: Reported on 07/16/2014 09/01/13   Esperanza Sheets, MD   BP 157/58 mmHg  Pulse 79  Temp(Src) 98.9 F (37.2 C) (Oral)  Resp 22  SpO2 94% Physical Exam CONSTITUTIONAL: elderly, no distress noted HEAD: Normocephalic/atraumatic EYES: EOMI ENMT: Mucous membranes moist NECK: supple no meningeal signs CV: S1/S2 noted LUNGS: no apparent distress ABDOMEN: soft, nontender, no rebound or guarding, bowel sounds noted throughout abdomen She is obese.  Stoma noted in lower abdomen, no active bleeding/drainage NEURO: Pt  is awake/alert/appropriate No facial droop.   EXTREMITIES: pulses normal/equal SKIN: warm, color normal   ED Course  Procedures   3:57 PM  I attempted to pass catheter under sterile conditions (d/w dr eskridge prior to attempt) and unable to pass foley via SP stoma He will see patient in the ER 4:12 PM Signed out to dr jon knapp to f/u with urology recommendations    MDM   Final diagnoses:  None    Nursing notes including past medical history and social history reviewed and considered in documentation     Joya Gaskins, MD 07/16/14 [phone]

## 2014-07-16 NOTE — ED Notes (Signed)
Urologist successful in replacing suprapubic cath.

## 2014-07-16 NOTE — ED Provider Notes (Signed)
Catheter was replaced by Dr Mena GoesEskridge.  Pt is ready to go home per his instructions.  Linwood DibblesJon Adell Panek, MD 07/16/14 [phone]

## 2014-07-16 NOTE — ED Notes (Signed)
MD at bedside. 

## 2014-07-16 NOTE — Progress Notes (Signed)
  Preprocedure diagnosis: Urinary retention, dislodged suprapubic tube Postprocedure diagnosis: Urinary retention  Procedure: Cystoscopic retrograde placement of wire with suprapubic tube replacement over wire  Surgeon: Lindzee Gouge  Type of anesthesia: None  Findings: Closure/scarring of suprapubic tube tract.  Normal bladder but visualization for due to cloudy urine.  Description of procedure: After verbal consent was obtained from the patient and daughter the patient was frog legged.The patient was given Cipro 500 mg by mouth 1.  The suprapubic tube site and the urethral meatus were prepped again with Betadine.  The cystoscope was passed per urethra into the bladder.  The bladder was too cloudy to get adequate visualization.  Also the patient reports that she had just voided into the diaper but she continued to have a significant postvoid.  About 200 cc of cloudy urine was evacuated from the bladder.  The bladder was refilled and visualization better.  I was able to negotiate the cystoscope along the anterior bladder wall toward the dome and was able to find the suprapubic tube tract.  Proper tract was confirmed by water leaking percutaneously out of the suprapubic tube site.  A sensor wire was then advanced from the tip of the flexible cystoscope up through the suprapubic tube tract and was visualized coming out through the skin.  The wire was backloaded on a 16 French Silastic catheter with the aid of an 18-gauge needle.  This created a type of Council tip catheter.This was advanced through the suprapubic tube site which was quite tight.  The catheter had to be popped through which we dilated the tract.  This was confirmed along the way with repeat visualization in the bladder.  The balloon was inflated with 10 cc and pulled up against the bladder wall and again all confirmed cystoscopically.  Urine was draining clear.  The Foley catheter was left to gravity drainage.  Disposition: Patient will be  discharged to home and follow-up in the office in 1 month for suprapubic tube change.

## 2014-07-16 NOTE — ED Notes (Signed)
Urologist at bedside.

## 2014-08-07 ENCOUNTER — Ambulatory Visit: Payer: Medicare Other | Admitting: Physician Assistant

## 2014-08-09 ENCOUNTER — Ambulatory Visit (INDEPENDENT_AMBULATORY_CARE_PROVIDER_SITE_OTHER): Payer: Medicare Other | Admitting: Nurse Practitioner

## 2014-08-09 ENCOUNTER — Encounter: Payer: Self-pay | Admitting: Nurse Practitioner

## 2014-08-09 VITALS — BP 128/70 | HR 73 | Ht 61.0 in | Wt 230.0 lb

## 2014-08-09 DIAGNOSIS — R609 Edema, unspecified: Secondary | ICD-10-CM

## 2014-08-09 DIAGNOSIS — I10 Essential (primary) hypertension: Secondary | ICD-10-CM | POA: Diagnosis not present

## 2014-08-09 DIAGNOSIS — F0390 Unspecified dementia without behavioral disturbance: Secondary | ICD-10-CM | POA: Diagnosis not present

## 2014-08-09 DIAGNOSIS — I5032 Chronic diastolic (congestive) heart failure: Secondary | ICD-10-CM | POA: Diagnosis not present

## 2014-08-09 NOTE — Progress Notes (Signed)
CARDIOLOGY OFFICE NOTE  Date:  08/09/2014    Krystal [name] Date of Birth: 1924/11/28 Medical Record #[id number]  PCP:  Mickie HillierLITTLE,[name], MD  Cardiologist:  Cooper/Kenden [name]    Chief Complaint  Patient presents with  . Edema    Follow up visit - seen for Dr. Excell Seltzerooper     History of Present Illness: Krystal [name] is a 81 y.o. female who presents today for a follow up visit. She is seen for Dr. Excell Seltzerooper. She is a prior patient of Dr. Deborah Chalkennant that I have cared for for many years. She has a hx of minimal CAD by cath in 2006, HTN, DM, very remote PAF, DVT on chronic coumadin, CKD, chronic LE edema, anemia, and dementia.She has had longstanding issues with her edema - echo with normal LV function. Has had GU issues and now with suprapubic cath in place.  Last seen by Tereso NewcomerScott Weaver PA back in August - his note was reviewed.  Comes in today. Here with her daughter. She is in a wheelchair. She is now [age]. Demeanor is pretty good for the most part. She is getting more and more sedentary. Her daughters provide a lot of her care. She has a CNA for 4 hours every day. Very rarely will she miss her medicine. Has had a constant UTI - now on suppressive therapy - it has really interfered with her coumadin. Daughter notes that her INRs are "just up and down". Has wondered about switching to NOAC. Has had 2 falls in the last 6 months - EMS has to come and get her off the floor. Getting harder and harder to get her out of her home. Still with lots of swelling in her legs - this is unchanged. Very hard to elevate her legs - she can only lay so far back until she can't breath. She denies being short of breath. No chest pain. Family is considering placement.   Past Medical History  Diagnosis Date  . Hypertension   . GERD (gastroesophageal reflux disease)   . Diabetes mellitus   . History of atrial fibrillation     very remote hx (many years ago)  . Urinary retention     s/p suprapubic catheter November  2013  . Coronary artery disease     LHC (5/06):  Mid LAD 20%, EF 60%  . Foley catheter in place   . Edema of lower extremity BILATERAL LEG  . Hyperlipidemia   . History of pulmonary embolus (PE) 2010--  TAKES COUMADIN  . History of DVT of lower extremity   . Anticoagulated on Coumadin   . Generalized weakness AGE RELATED--  USES WALKER  . Vertigo   . OA (osteoarthritis)   . Thin skin FRAGILE  . Hypothyroidism NO MEDS  . Impaired hearing BILATERAL AIDS  . Chronic diarrhea INTERMITTANT  . Hx of echocardiogram     Echo (8/15):  EF 55-60%, no RWMA, PASP 36 mmHg  . Dementia     Past Surgical History  Procedure Laterality Date  . Knee surgery    . Tubal ligation    . Cardiac catheterization  09-27-2004  DR TENNENT    NORMAL LVF/ MILD IRREGULARITIES IN MID LAD W/ NO SIGNIFICANT OBSTRUCTIVE DISEASE  . Abdominal hysterectomy  1994  (APPROX)    W/ BILATERAL SALPINGO-OOPHORECTOMY  . Cholecystectomy  2000  . Knee arthroscopy  1998  (APPROX)  . Bladder suspension  2002  (APPROX)    W/ ANTERIOR AND POSTERIOR REPAIR  .  Cataract extraction w/ intraocular lens  implant, bilateral    . Transthoracic echocardiogram  02-23-2009  (DX W/ PE & PAF)    EF 65-70%/ MILD MR/ MODERATE TR/ MILDLY DILATED RA  &  LA/  RV SYSTOLIC FUNCTION MODERATLY REDUCED   . Insertion of suprapubic catheter  04/02/2012    Procedure: INSERTION OF SUPRAPUBIC CATHETER;  Surgeon: Garnett Farm, MD;  Location: [facility];  Service: Urology;  Laterality: N/A;  SUPRAPUBIC TUBE PLACEMENT  . Cystoscopy  04/02/2012    Procedure: CYSTOSCOPY;  Surgeon: Garnett Farm, MD;  Location: [facility];  Service: Urology;;     Medications: Current Outpatient Prescriptions  Medication Sig Dispense Refill  . acetaminophen (TYLENOL) 500 MG tablet Take 500-1,000 mg by mouth every 6 (six) hours as needed (for pain).    Marland Kitchen ALPRAZolam (XANAX) 0.25 MG tablet Take 0.5-1 tablets (0.125-0.25 mg total) by mouth 2  (two) times daily as needed for sleep or anxiety (0.5-1 tablet). (Patient taking differently: Take 0.25 mg by mouth at bedtime as needed for anxiety or sleep. ) 30 tablet 0  . bumetanide (BUMEX) 2 MG tablet Take 1 tablet (2 mg total) by mouth 2 (two) times daily. 60 tablet 11  . cetirizine (ZYRTEC) 10 MG tablet Take 10 mg by mouth daily.     Marland Kitchen diltiazem (MATZIM LA) 240 MG 24 hr tablet Take 240 mg by mouth every evening.    . diphenhydramine-acetaminophen (TYLENOL PM) 25-500 MG TABS Take 1 tablet by mouth at bedtime as needed (sleep).    . ferrous sulfate 325 (65 FE) MG tablet Take 325 mg by mouth at bedtime.     . Fluticasone-Salmeterol (ADVAIR) 250-50 MCG/DOSE AEPB Inhale 1 puff into the lungs at bedtime as needed (shortness of breath).     . insulin detemir (LEVEMIR) 100 UNIT/ML injection Inject 45 Units into the skin daily with supper.    . lidocaine (LIDODERM) 5 % Place 1 patch onto the skin as needed. Applied to back. Remove & Discard patch within 12 hours or as directed by MD    . losartan (COZAAR) 100 MG tablet Take 0.5 tablets (50 mg total) by mouth daily. (Patient taking differently: Take 100 mg by mouth daily. )    . methenamine (HIPREX) 1 G tablet Take 1 g by mouth 2 (two) times daily with a meal.    . methimazole (TAPAZOLE) 10 MG tablet Take 5 mg by mouth at bedtime.     . metoCLOPramide (REGLAN) 5 MG tablet Take 2.5 mg by mouth 3 (three) times daily before meals.     . mirabegron ER (MYRBETRIQ) 50 MG TB24 tablet Take 50 mg by mouth daily.    . mirtazapine (REMERON) 15 MG tablet Take 15 mg by mouth at bedtime.    Marland Kitchen omeprazole (PRILOSEC) 40 MG capsule Take 40 mg by mouth every evening.    Marland Kitchen oxybutynin (DITROPAN) 5 MG tablet Take 5 mg by mouth 3 (three) times daily.    [name] (SYSTANE OP) Apply 1 drop to eye daily as needed (dry eye).    . potassium chloride SA (K-DUR,KLOR-CON) 20 MEQ tablet Take 0.5 tablets (10 mEq total) by mouth daily. (Patient taking  differently: Take 10 mEq by mouth 2 (two) times daily. )    . pravastatin (PRAVACHOL) 20 MG tablet Take 20 mg by mouth every evening.     . senna-docusate (SENOKOT-S) 8.6-50 MG per tablet Take 1 tablet by mouth 2 (two) times  daily.    . sitaGLIPtin (JANUVIA) 50 MG tablet Take 50 mg by mouth daily.    . vitamin B-12 (CYANOCOBALAMIN) 1000 MCG tablet Take 1,000 mcg by mouth daily.    Marland Kitchen warfarin (COUMADIN) 2 MG tablet Take 1-2 mg by mouth daily. 2 mg on sun, mon, wed and Friday and 1 mg all other days in the evenings    . ciprofloxacin (CIPRO) 500 MG tablet Take 1 tablet (500 mg total) by mouth 2 (two) times daily. (Patient not taking: Reported on 07/16/2014) 14 tablet 0  . furosemide (LASIX) 20 MG tablet Take 1 tablet (20 mg total) by mouth 2 (two) times daily. (Patient not taking: Reported on 07/16/2014) 10 tablet 0  . insulin glargine (LANTUS) 100 UNIT/ML injection Inject 0.2 mLs (20 Units total) into the skin at bedtime. (Patient not taking: Reported on 07/16/2014) 10 mL 12  . sodium phosphate (FLEET) 7-19 GM/118ML ENEM Place 133 mLs (1 enema total) rectally daily as needed for severe constipation. (Patient not taking: Reported on 07/16/2014)  0   No current facility-administered medications for this visit.    Allergies: Allergies  Allergen Reactions  . Humibid La [Guaifenesin] Shortness Of Breath  . Latex Hives  . Detrol [Tolterodine Tartrate] Hives  . Keflex [Cephalexin]     hives  . Vioxx [Rofecoxib] Hives    Social History: The patient  reports that she has never smoked. Her smokeless tobacco use includes Snuff. She reports that she does not drink alcohol or use illicit drugs.   Family History: The patient's family history includes Cancer in her sister; Diabetes in her father; Heart attack in her father; Heart disease in her mother; Hypertension in her father and mother; Stroke in her mother.   Review of Systems: Please see the history of present illness.   Otherwise, the review of  systems is positive for chronic swelling.   All other systems are reviewed and negative.   Physical Exam: VS:  BP 128/70 mmHg  Pulse 73  Ht  (1.549 m)  Wt 230 lb (104.327 kg)  BMI 43.48 kg/m2 .  BMI Body mass index is 43.48 kg/(m^2).  Wt Readings from Last 3 Encounters:  08/09/14 230 lb (104.327 kg)  01/16/14 233 lb (105.688 kg)  12/26/13 228 lb (103.42 kg)    General: Pleasantly confused. She is in no acute distress. She talks about how she just retired last week.  HEENT: Normal. Neck: Supple, no JVD, carotid bruits, or masses noted.  Cardiac: Regular rate and rhythm. Heart tones are distant. Her legs remain quite full with edema - her legs are wrapped.  Respiratory:  Lungs are clear to auscultation bilaterally with normal work of breathing.  GI: Soft and nontender.  MS: No deformity or atrophy. Gait is not tested.  Skin: Warm and dry. Color is normal.  Neuro:  Strength and sensation are intact and no gross focal deficits noted.  Psych: Alert, appropriate and with normal affect but she is pleasantly confused.   LABORATORY DATA:  EKG:  EKG is not ordered today.  Lab Results  Component Value Date   WBC 11.3* 03/19/2014   HGB 10.2* 03/19/2014   HCT 32.6* 03/19/2014   PLT 289 03/19/2014   GLUCOSE 231* 03/19/2014   CHOL 112 08/15/2013   TRIG 81 08/15/2013   HDL 39* 08/15/2013   LDLCALC 57 08/15/2013   ALT 6 03/19/2014   AST 10 03/19/2014   NA 142 03/19/2014   K 4.8 03/19/2014   CL 103  03/19/2014   CREATININE 1.70* 03/19/2014   BUN 40* 03/19/2014   CO2 26 03/19/2014   TSH 3.610 08/22/2013   INR 3.38* 03/19/2014   HGBA1C 6.4* 08/22/2013    BNP (last 3 results) No results for input(s): BNP in the last 8760 hours.  ProBNP (last 3 results)  Recent Labs  08/15/13 2235 01/16/14 1521  PROBNP 284.1 67.0     Other Studies Reviewed Today: Studies: - LHC (5/06): Mid LAD 20%, EF 60% - Echo (3/14): Mild LVH, EF 60%, no RWMA, Gr 1 DD, normal RV  function - Echo (8/15): EF 55-60%, no RWMA, PASP 36 mmHg   Assessment/Plan: 1. Chronic swelling/diastolic HF - would continue with her current plan of care - leave her on the same dose of diuretics - I really do not see this improving. She is not short of breath and seems pretty comfortable.   2. Dementia - she is quite pleasant - but is requiring more care - family apparently considering placement.  3. PAF - in sinus by exam  4. DVT/prior PE   5. Chronic anticoagulation - sounds like she is not really able to maintain a therapeutic window. Not sure if she would be able to afford NOAC but they may be option or just stop her anticoagulation and place just on aspirin. She is getting more feeble. Family having harder time transporting. Her daughter is going to talk with her sister - they see Dr. Clarene Duke later this week and will discuss with him as well.  Current medicines are reviewed with the patient today.  The patient does not have concerns regarding medicines other than what has been noted above.  The following changes have been made:  See above.  Labs/ tests ordered today include:   No orders of the defined types were placed in this encounter.     Disposition:   FU with me in 6 months  Patient is agreeable to this plan and will call if any problems develop in the interim.   Signed: Rosalio Macadamia, RN, ANP-C 08/09/2014 2:10 PM  [facility] [address] [place], [place]  [address] Phone: [phone] Fax: [phone]

## 2014-08-09 NOTE — Patient Instructions (Addendum)
Stay on your current medicines  See me in 6 months  I will send my note to Dr. Clarene DukeLittle  Call the [facility] office at [phone] if you have any questions, problems or concerns.

## 2014-10-18 DEATH — deceased

## 2014-11-13 ENCOUNTER — Other Ambulatory Visit: Payer: Self-pay

## 2015-02-06 ENCOUNTER — Ambulatory Visit: Payer: Medicare Other | Admitting: Nurse Practitioner

## 2015-07-13 IMAGING — CT CT ABD-PELV W/O CM
2 of 4 series · 17 of 46 positions shown, 19 images · non-contrast
Comparison: 12/30/2012 CT.

CLINICAL DATA: Abdominal pain.  No recent bowel movement.

EXAM:
CT ABDOMEN AND PELVIS WITHOUT CONTRAST
TECHNIQUE: Multidetector CT imaging of the abdomen and pelvis was performed
following the standard protocol without intravenous contrast.

[Series 2: routine · axial · 0.94mm/px · z∈[-397,+13]mm · 14 of 90 slices shown, 16 images]
[im 4/90  soft-tissue]
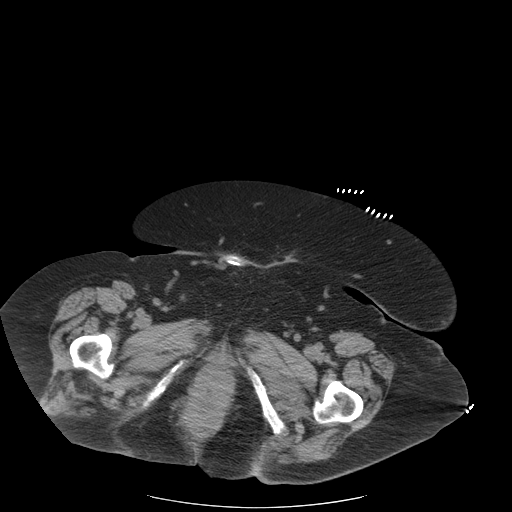
[im 4/90  bone]
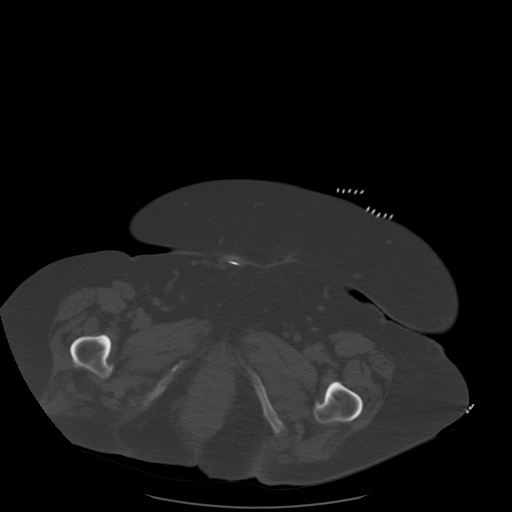
[im 11/90  soft-tissue]
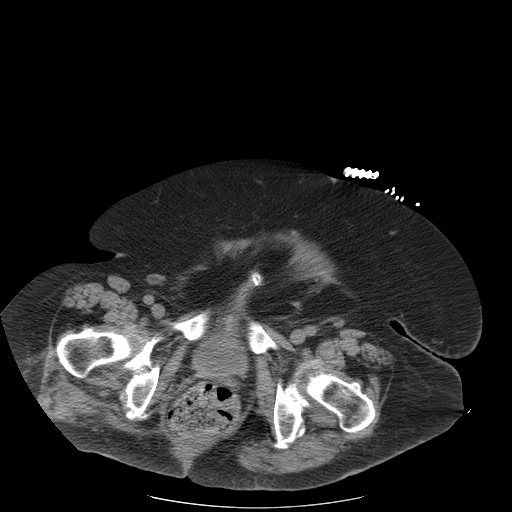
[im 18/90  soft-tissue]
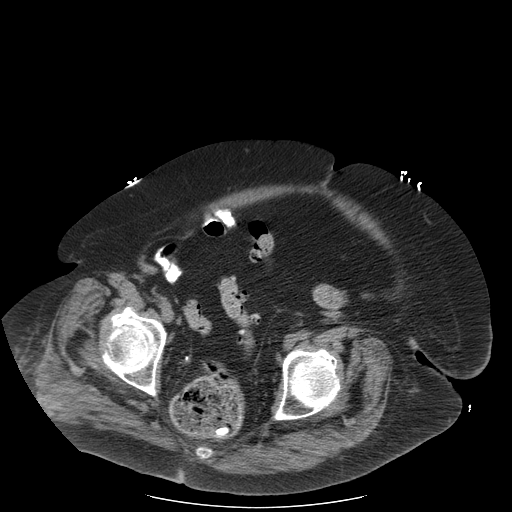
[im 24/90  soft-tissue]
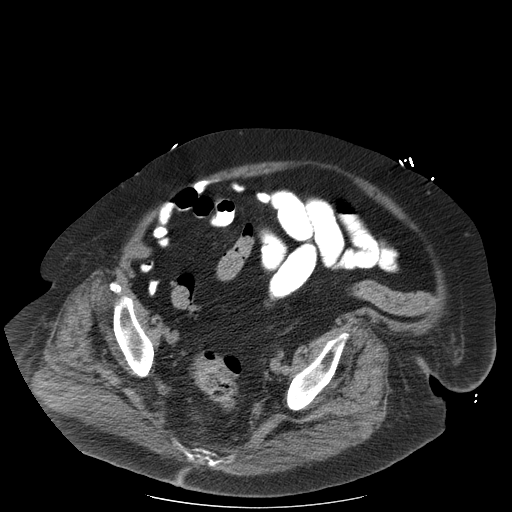
[im 31/90  soft-tissue]
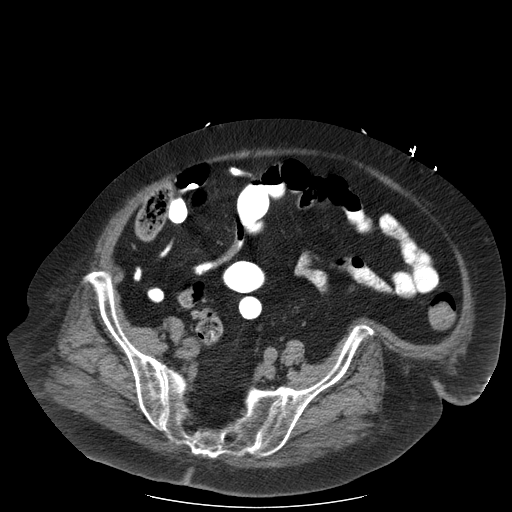
[im 35/90  soft-tissue]
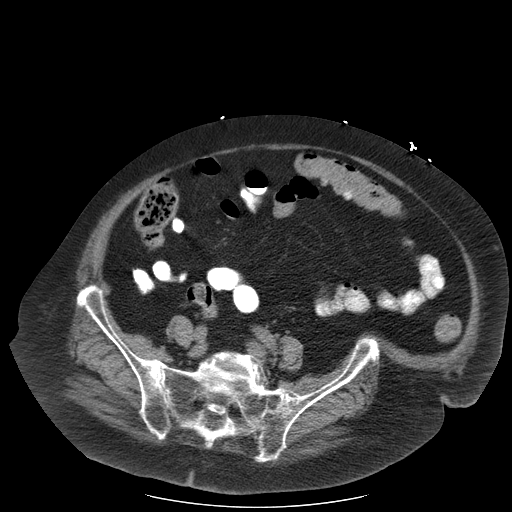
[im 42/90  soft-tissue]
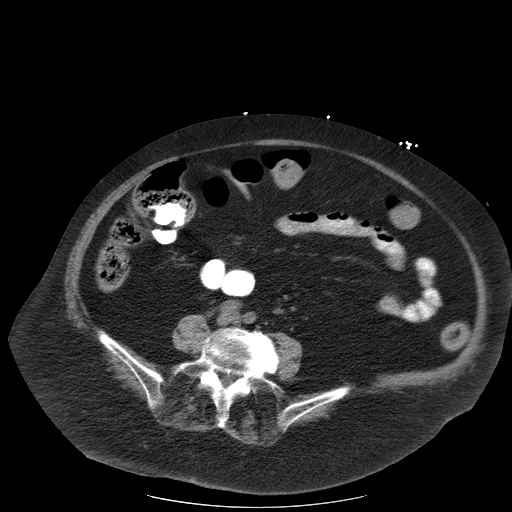
[im 48/90  soft-tissue]
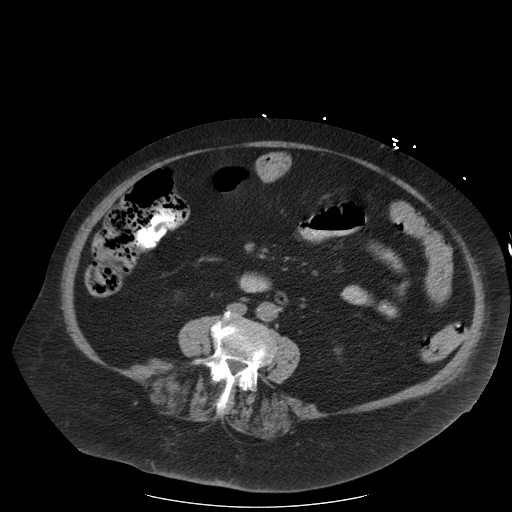
[im 55/90  soft-tissue]
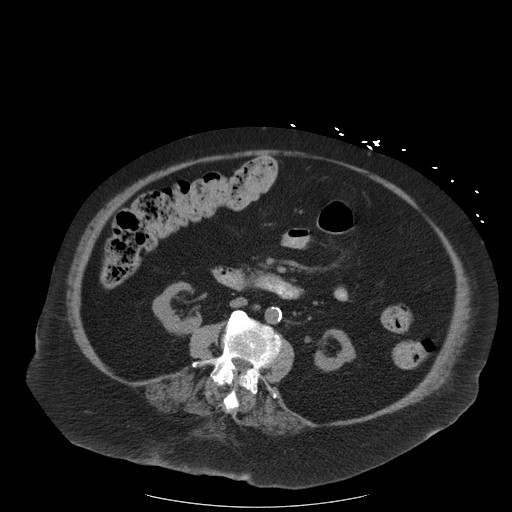
[im 55/90  bone]
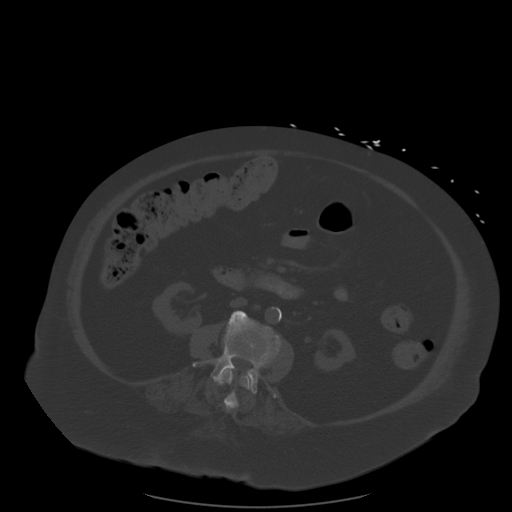
[im 59/90  soft-tissue]
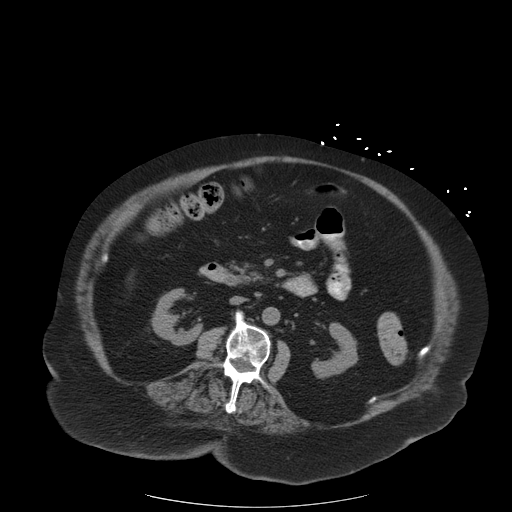
[im 66/90  soft-tissue]
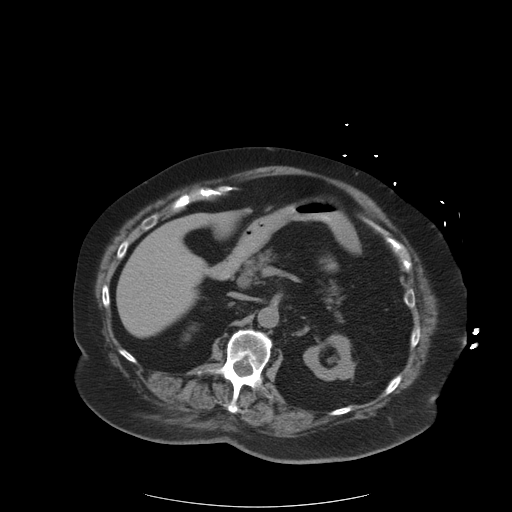
[im 72/90  soft-tissue]
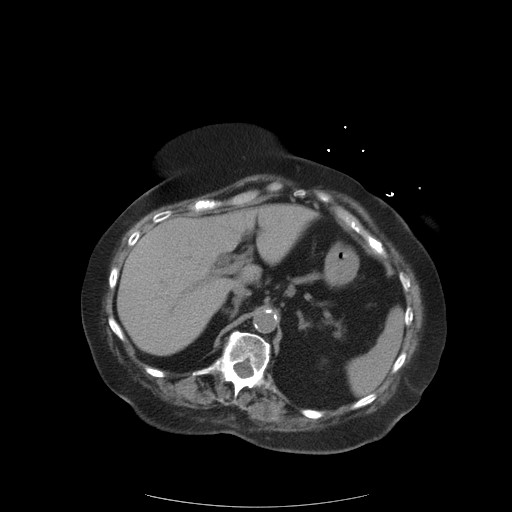
[im 79/90  soft-tissue]
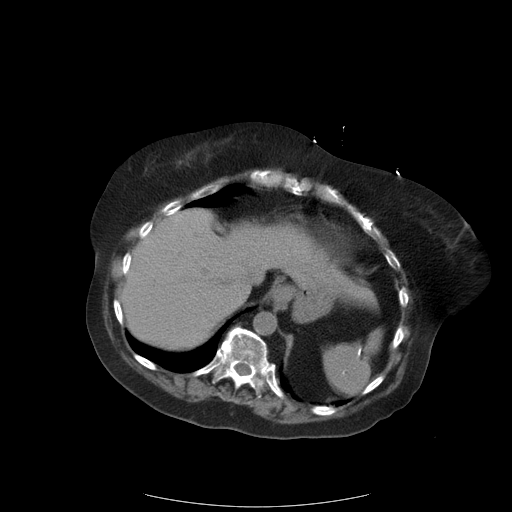
[im 86/90  soft-tissue]
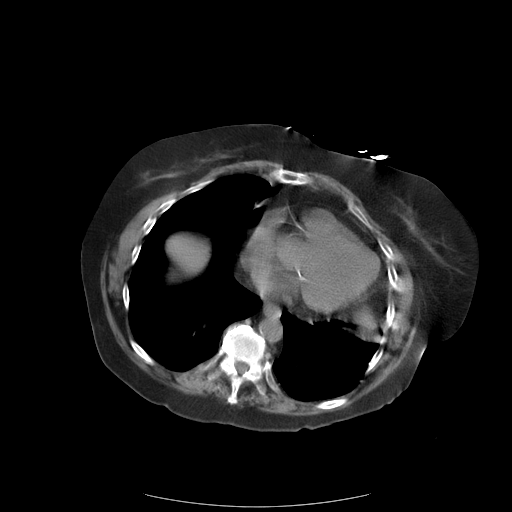

[cor · coronal · 0.94mm/px · 3 of 139 slices shown]
[im 47/139  soft-tissue]
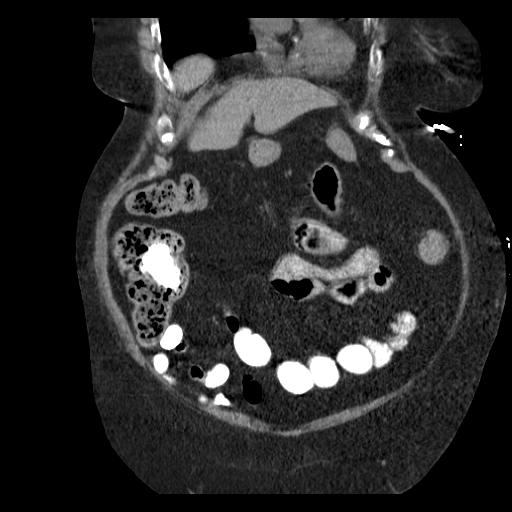
[im 62/139  soft-tissue]
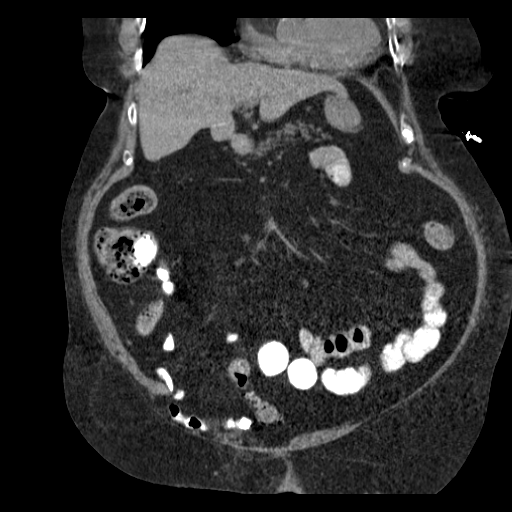
[im 77/139  soft-tissue]
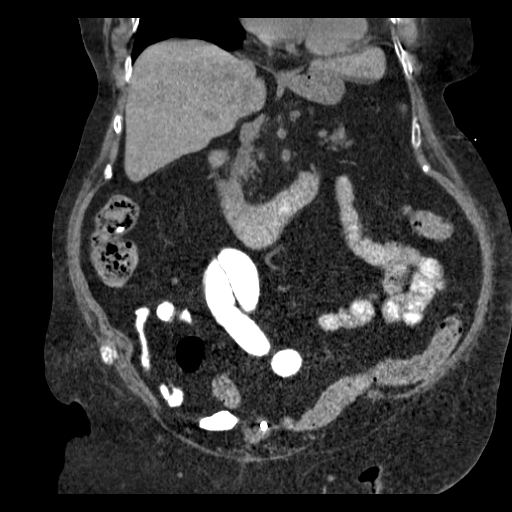

[17 of 46 positions shown; findings below may reference images not displayed]

FINDINGS: Moderate stool rectosigmoid region. Scattered colonic diverticula
without extra luminal bowel inflammatory process, free fluid or free
air.

Lateral to the right rectus muscle there is bulge/ mild Spigelian
hernia containing fat and small bowel loops. This does not cause
obstruction.

Suprapubic catheter in place.

Left adrenal nodule consistent with small adenoma stable.

Post cholecystectomy and hysterectomy.

Taking into account limitation by non contrast imaging, No worrisome
hepatic, splenic, renal, right renal or pancreatic lesion.

Atherosclerotic type changes coronary arteries, aorta and aortic
branch vessels. No abdominal aortic aneurysm.

Chronic L1 anterior wedge compression deformity.
IMPRESSION: Moderate stool rectosigmoid region. Scattered colonic diverticula
without extra luminal bowel inflammatory process, free fluid or free
air.

Please see above.
